# Patient Record
Sex: Female | Born: 1955 | Race: Black or African American | Hispanic: No | Marital: Married | Smoking: Never smoker
Health system: Southern US, Community
[De-identification: ages and names within clinical notes are randomized; demographics above are authoritative.]

## PROBLEM LIST (undated history)

## (undated) DIAGNOSIS — E739 Lactose intolerance, unspecified: Secondary | ICD-10-CM

## (undated) DIAGNOSIS — D649 Anemia, unspecified: Secondary | ICD-10-CM

## (undated) DIAGNOSIS — K219 Gastro-esophageal reflux disease without esophagitis: Secondary | ICD-10-CM

## (undated) DIAGNOSIS — M2550 Pain in unspecified joint: Secondary | ICD-10-CM

## (undated) DIAGNOSIS — IMO0001 Reserved for inherently not codable concepts without codable children: Secondary | ICD-10-CM

## (undated) DIAGNOSIS — M8580 Other specified disorders of bone density and structure, unspecified site: Secondary | ICD-10-CM

## (undated) DIAGNOSIS — Z8774 Personal history of (corrected) congenital malformations of heart and circulatory system: Secondary | ICD-10-CM

## (undated) DIAGNOSIS — N809 Endometriosis, unspecified: Secondary | ICD-10-CM

## (undated) DIAGNOSIS — I10 Essential (primary) hypertension: Secondary | ICD-10-CM

## (undated) DIAGNOSIS — M1711 Unilateral primary osteoarthritis, right knee: Secondary | ICD-10-CM

## (undated) DIAGNOSIS — K589 Irritable bowel syndrome without diarrhea: Secondary | ICD-10-CM

## (undated) DIAGNOSIS — M549 Dorsalgia, unspecified: Secondary | ICD-10-CM

## (undated) DIAGNOSIS — M7072 Other bursitis of hip, left hip: Secondary | ICD-10-CM

## (undated) DIAGNOSIS — M7071 Other bursitis of hip, right hip: Secondary | ICD-10-CM

## (undated) DIAGNOSIS — T7840XA Allergy, unspecified, initial encounter: Secondary | ICD-10-CM

## (undated) DIAGNOSIS — Z91018 Allergy to other foods: Secondary | ICD-10-CM

## (undated) DIAGNOSIS — K5900 Constipation, unspecified: Secondary | ICD-10-CM

## (undated) DIAGNOSIS — M25552 Pain in left hip: Secondary | ICD-10-CM

## (undated) DIAGNOSIS — L309 Dermatitis, unspecified: Secondary | ICD-10-CM

## (undated) DIAGNOSIS — B059 Measles without complication: Secondary | ICD-10-CM

## (undated) DIAGNOSIS — G459 Transient cerebral ischemic attack, unspecified: Secondary | ICD-10-CM

## (undated) DIAGNOSIS — R600 Localized edema: Secondary | ICD-10-CM

## (undated) DIAGNOSIS — H269 Unspecified cataract: Secondary | ICD-10-CM

## (undated) DIAGNOSIS — M25551 Pain in right hip: Secondary | ICD-10-CM

## (undated) DIAGNOSIS — Z8679 Personal history of other diseases of the circulatory system: Secondary | ICD-10-CM

## (undated) DIAGNOSIS — N390 Urinary tract infection, site not specified: Secondary | ICD-10-CM

## (undated) DIAGNOSIS — B019 Varicella without complication: Secondary | ICD-10-CM

## (undated) DIAGNOSIS — Z0000 Encounter for general adult medical examination without abnormal findings: Secondary | ICD-10-CM

## (undated) HISTORY — DX: Other bursitis of hip, left hip: M70.72

## (undated) HISTORY — PX: OTHER SURGICAL HISTORY: SHX169

## (undated) HISTORY — DX: Unspecified cataract: H26.9

## (undated) HISTORY — DX: Urinary tract infection, site not specified: N39.0

## (undated) HISTORY — DX: Personal history of (corrected) congenital malformations of heart and circulatory system: Z87.74

## (undated) HISTORY — DX: Measles without complication: B05.9

## (undated) HISTORY — DX: Transient cerebral ischemic attack, unspecified: G45.9

## (undated) HISTORY — DX: Pain in unspecified joint: M25.50

## (undated) HISTORY — DX: Pain in right hip: M25.551

## (undated) HISTORY — DX: Gastro-esophageal reflux disease without esophagitis: K21.9

## (undated) HISTORY — DX: Allergy to other foods: Z91.018

## (undated) HISTORY — DX: Irritable bowel syndrome, unspecified: K58.9

## (undated) HISTORY — DX: Unilateral primary osteoarthritis, right knee: M17.11

## (undated) HISTORY — PX: APPENDECTOMY: SHX54

## (undated) HISTORY — DX: Dorsalgia, unspecified: M54.9

## (undated) HISTORY — DX: Lactose intolerance, unspecified: E73.9

## (undated) HISTORY — DX: Constipation, unspecified: K59.00

## (undated) HISTORY — DX: Other bursitis of hip, right hip: M70.71

## (undated) HISTORY — DX: Anemia, unspecified: D64.9

## (undated) HISTORY — DX: Encounter for general adult medical examination without abnormal findings: Z00.00

## (undated) HISTORY — DX: Localized edema: R60.0

## (undated) HISTORY — DX: Pain in left hip: M25.552

## (undated) HISTORY — DX: Other specified disorders of bone density and structure, unspecified site: M85.80

## (undated) HISTORY — DX: Reserved for inherently not codable concepts without codable children: IMO0001

## (undated) HISTORY — DX: Essential (primary) hypertension: I10

## (undated) HISTORY — PX: RIGHT OOPHORECTOMY: SHX2359

## (undated) HISTORY — DX: Endometriosis, unspecified: N80.9

## (undated) HISTORY — DX: Dermatitis, unspecified: L30.9

## (undated) HISTORY — DX: Personal history of other diseases of the circulatory system: Z86.79

## (undated) HISTORY — PX: ABDOMINAL HYSTERECTOMY: SHX81

## (undated) HISTORY — DX: Allergy, unspecified, initial encounter: T78.40XA

## (undated) HISTORY — DX: Varicella without complication: B01.9

## (undated) HISTORY — PX: COLONOSCOPY: SHX174

---

## 2002-12-05 DIAGNOSIS — G459 Transient cerebral ischemic attack, unspecified: Secondary | ICD-10-CM

## 2002-12-05 HISTORY — DX: Transient cerebral ischemic attack, unspecified: G45.9

## 2004-12-05 HISTORY — PX: ASD REPAIR: SHX258

## 2007-06-15 ENCOUNTER — Encounter: Payer: Self-pay | Admitting: Family Medicine

## 2009-09-11 LAB — HM COLONOSCOPY

## 2013-09-06 ENCOUNTER — Telehealth: Payer: Self-pay | Admitting: Family Medicine

## 2013-09-06 ENCOUNTER — Telehealth: Payer: Self-pay | Admitting: Physician Assistant

## 2013-09-06 NOTE — Telephone Encounter (Signed)
Rec'd from Dr. Reatha Harps [facility] forward 19 pages to Dr. Daphine Deutscher

## 2013-09-06 NOTE — Telephone Encounter (Signed)
Received medical records from Dr. Dondra Prader

## 2013-09-09 ENCOUNTER — Telehealth: Payer: Self-pay | Admitting: Family Medicine

## 2013-09-09 NOTE — Telephone Encounter (Signed)
Received medical records from Dr. Ilsa Iha

## 2013-09-16 ENCOUNTER — Encounter: Payer: Self-pay | Admitting: Family Medicine

## 2013-09-16 ENCOUNTER — Ambulatory Visit (INDEPENDENT_AMBULATORY_CARE_PROVIDER_SITE_OTHER): Payer: PRIVATE HEALTH INSURANCE | Admitting: Family Medicine

## 2013-09-16 ENCOUNTER — Ambulatory Visit: Payer: PRIVATE HEALTH INSURANCE | Admitting: Physician Assistant

## 2013-09-16 VITALS — BP 106/72 | HR 71 | Temp 97.7°F | Ht 61.0 in | Wt 171.0 lb

## 2013-09-16 DIAGNOSIS — M25559 Pain in unspecified hip: Secondary | ICD-10-CM

## 2013-09-16 DIAGNOSIS — T7840XA Allergy, unspecified, initial encounter: Secondary | ICD-10-CM

## 2013-09-16 DIAGNOSIS — G459 Transient cerebral ischemic attack, unspecified: Secondary | ICD-10-CM

## 2013-09-16 DIAGNOSIS — K219 Gastro-esophageal reflux disease without esophagitis: Secondary | ICD-10-CM

## 2013-09-16 DIAGNOSIS — L309 Dermatitis, unspecified: Secondary | ICD-10-CM

## 2013-09-16 DIAGNOSIS — M25551 Pain in right hip: Secondary | ICD-10-CM

## 2013-09-16 DIAGNOSIS — Z8774 Personal history of (corrected) congenital malformations of heart and circulatory system: Secondary | ICD-10-CM

## 2013-09-16 DIAGNOSIS — E739 Lactose intolerance, unspecified: Secondary | ICD-10-CM | POA: Insufficient documentation

## 2013-09-16 DIAGNOSIS — D649 Anemia, unspecified: Secondary | ICD-10-CM

## 2013-09-16 DIAGNOSIS — I10 Essential (primary) hypertension: Secondary | ICD-10-CM

## 2013-09-16 DIAGNOSIS — Z23 Encounter for immunization: Secondary | ICD-10-CM

## 2013-09-16 DIAGNOSIS — L259 Unspecified contact dermatitis, unspecified cause: Secondary | ICD-10-CM

## 2013-09-16 DIAGNOSIS — N809 Endometriosis, unspecified: Secondary | ICD-10-CM

## 2013-09-16 DIAGNOSIS — IMO0001 Reserved for inherently not codable concepts without codable children: Secondary | ICD-10-CM

## 2013-09-16 DIAGNOSIS — N390 Urinary tract infection, site not specified: Secondary | ICD-10-CM | POA: Insufficient documentation

## 2013-09-16 DIAGNOSIS — Z8679 Personal history of other diseases of the circulatory system: Secondary | ICD-10-CM

## 2013-09-16 NOTE — Patient Instructions (Signed)
Dr Margart Sickles website for place to go get [employer] or [employer] an 81 mg Aspirin Consider MegaRed krill caps, daily   Preventive Care for Adults, Female A healthy lifestyle and preventive care can promote health and wellness. Preventive health guidelines for women include the following key practices.  A routine yearly physical is a good way to check with your caregiver about your health and preventive screening. It is a chance to share any concerns and updates on your health, and to receive a thorough exam.  Visit your dentist for a routine exam and preventive care every 6 months. Brush your teeth twice a day and floss once a day. Good oral hygiene prevents tooth decay and gum disease.  The frequency of eye exams is based on your age, health, family medical history, use of contact lenses, and other factors. Follow your caregiver's recommendations for frequency of eye exams.  Eat a healthy diet. Foods like vegetables, fruits, whole grains, low-fat dairy products, and lean protein foods contain the nutrients you need without too many calories. Decrease your intake of foods high in solid fats, added sugars, and salt. Eat the right amount of calories for you.Get information about a proper diet from your caregiver, if necessary.  Regular physical exercise is one of the most important things you can do for your health. Most adults should get at least 150 minutes of moderate-intensity exercise (any activity that increases your heart rate and causes you to sweat) each week. In addition, most adults need muscle-strengthening exercises on 2 or more days a week.  Maintain a healthy weight. The body mass index (BMI) is a screening tool to identify possible weight problems. It provides an estimate of body fat based on height and weight. Your caregiver can help determine your BMI, and can help you achieve or maintain a healthy weight.For adults 20 years and older:  A BMI below 18.5 is  considered underweight.  A BMI of 18.5 to 24.9 is normal.  A BMI of 25 to 29.9 is considered overweight.  A BMI of 30 and above is considered obese.  Maintain normal blood lipids and cholesterol levels by exercising and minimizing your intake of saturated fat. Eat a balanced diet with plenty of fruit and vegetables. Blood tests for lipids and cholesterol should begin at age 51 and be repeated every 5 years. If your lipid or cholesterol levels are high, you are over 50, or you are at high risk for heart disease, you may need your cholesterol levels checked more frequently.Ongoing high lipid and cholesterol levels should be treated with medicines if diet and exercise are not effective.  If you smoke, find out from your caregiver how to quit. If you do not use tobacco, do not start.  If you are pregnant, do not drink alcohol. If you are breastfeeding, be very cautious about drinking alcohol. If you are not pregnant and choose to drink alcohol, do not exceed 1 drink per day. One drink is considered to be 12 ounces (355 mL) of beer, 5 ounces (148 mL) of wine, or 1.5 ounces (44 mL) of liquor.  Avoid use of street drugs. Do not share needles with anyone. Ask for help if you need support or instructions about stopping the use of drugs.  High blood pressure causes heart disease and increases the risk of stroke. Your blood pressure should be checked at least every 1 to 2 years. Ongoing high blood pressure should be treated with medicines if weight loss and  exercise are not effective.  If you are 64 to 55 years old, ask your caregiver if you should take aspirin to prevent strokes.  Diabetes screening involves taking a blood sample to check your fasting blood sugar level. This should be done once every 3 years, after age 21, if you are within normal weight and without risk factors for diabetes. Testing should be considered at a younger age or be carried out more frequently if you are overweight and have at  least 1 risk factor for diabetes.  Breast cancer screening is essential preventive care for women. You should practice "breast self-awareness." This means understanding the normal appearance and feel of your breasts and may include breast self-examination. Any changes detected, no matter how small, should be reported to a caregiver. Women in their 67s and 30s should have a clinical breast exam (CBE) by a caregiver as part of a regular health exam every 1 to 3 years. After age 27, women should have a CBE every year. Starting at age 61, women should consider having a mammography (breast X-ray test) every year. Women who have a family history of breast cancer should talk to their caregiver about genetic screening. Women at a high risk of breast cancer should talk to their caregivers about having magnetic resonance imaging (MRI) and a mammography every year.  The Pap test is a screening test for cervical cancer. A Pap test can show cell changes on the cervix that might become cervical cancer if left untreated. A Pap test is a procedure in which cells are obtained and examined from the lower end of the uterus (cervix).  Women should have a Pap test starting at age 1.  Between ages 52 and 92, Pap tests should be repeated every 2 years.  Beginning at age 29, you should have a Pap test every 3 years as long as the past 3 Pap tests have been normal.  Some women have medical problems that increase the chance of getting cervical cancer. Talk to your caregiver about these problems. It is especially important to talk to your caregiver if a new problem develops soon after your last Pap test. In these cases, your caregiver may recommend more frequent screening and Pap tests.  The above recommendations are the same for women who have or have not gotten the vaccine for human papillomavirus (HPV).  If you had a hysterectomy for a problem that was not cancer or a condition that could lead to cancer, then you no longer  need Pap tests. Even if you no longer need a Pap test, a regular exam is a good idea to make sure no other problems are starting.  If you are between ages 43 and 94, and you have had normal Pap tests going back 10 years, you no longer need Pap tests. Even if you no longer need a Pap test, a regular exam is a good idea to make sure no other problems are starting.  If you have had past treatment for cervical cancer or a condition that could lead to cancer, you need Pap tests and screening for cancer for at least 20 years after your treatment.  If Pap tests have been discontinued, risk factors (such as a new sexual partner) need to be reassessed to determine if screening should be resumed.  The HPV test is an additional test that may be used for cervical cancer screening. The HPV test looks for the virus that can cause the cell changes on the cervix. The cells  collected during the Pap test can be tested for HPV. The HPV test could be used to screen women aged 36 years and older, and should be used in women of any age who have unclear Pap test results. After the age of 41, women should have HPV testing at the same frequency as a Pap test.  Colorectal cancer can be detected and often prevented. Most routine colorectal cancer screening begins at the age of 22 and continues through age 78. However, your caregiver may recommend screening at an earlier age if you have risk factors for colon cancer. On a yearly basis, your caregiver may provide home test kits to check for hidden blood in the stool. Use of a small camera at the end of a tube, to directly examine the colon (sigmoidoscopy or colonoscopy), can detect the earliest forms of colorectal cancer. Talk to your caregiver about this at age 49, when routine screening begins. Direct examination of the colon should be repeated every 5 to 10 years through age 2, unless early forms of pre-cancerous polyps or small growths are found.  Hepatitis C blood testing is  recommended for all people born from [birth year] through 1965 and any individual with known risks for hepatitis C.  Practice safe sex. Use condoms and avoid high-risk sexual practices to reduce the spread of sexually transmitted infections (STIs). STIs include gonorrhea, chlamydia, syphilis, trichomonas, herpes, HPV, and human immunodeficiency virus (HIV). Herpes, HIV, and HPV are viral illnesses that have no cure. They can result in disability, cancer, and death. Sexually active women aged 107 and younger should be checked for chlamydia. Older women with new or multiple partners should also be tested for chlamydia. Testing for other STIs is recommended if you are sexually active and at increased risk.  Osteoporosis is a disease in which the bones lose minerals and strength with aging. This can result in serious bone fractures. The risk of osteoporosis can be identified using a bone density scan. Women ages 75 and over and women at risk for fractures or osteoporosis should discuss screening with their caregivers. Ask your caregiver whether you should take a calcium supplement or vitamin D to reduce the rate of osteoporosis.  Menopause can be associated with physical symptoms and risks. Hormone replacement therapy is available to decrease symptoms and risks. You should talk to your caregiver about whether hormone replacement therapy is right for you.  Use sunscreen with sun protection factor (SPF) of 30 or more. Apply sunscreen liberally and repeatedly throughout the day. You should seek shade when your shadow is shorter than you. Protect yourself by wearing long sleeves, pants, a wide-brimmed hat, and sunglasses year round, whenever you are outdoors.  Once a month, do a whole body skin exam, using a mirror to look at the skin on your back. Notify your caregiver of new moles, moles that have irregular borders, moles that are larger than a pencil eraser, or moles that have changed in shape or color.  Stay current  with required immunizations.  Influenza. You need a dose every fall (or winter). The composition of the flu vaccine changes each year, so being vaccinated once is not enough.  Pneumococcal polysaccharide. You need 1 to 2 doses if you smoke cigarettes or if you have certain chronic medical conditions. You need 1 dose at age 65 (or older) if you have never been vaccinated.  Tetanus, diphtheria, pertussis (Tdap, Td). Get 1 dose of Tdap vaccine if you are younger than age 72, are over 39 and  have contact with an infant, are a Research scientist (physical sciences), are pregnant, or simply want to be protected from whooping cough. After that, you need a Td booster dose every 10 years. Consult your caregiver if you have not had at least 3 tetanus and diphtheria-containing shots sometime in your life or have a deep or dirty wound.  HPV. You need this vaccine if you are a woman age 97 or younger. The vaccine is given in 3 doses over 6 months.  Measles, mumps, rubella (MMR). You need at least 1 dose of MMR if you were born in 1957 or later. You may also need a second dose.  Meningococcal. If you are age 41 to 46 and a first-year college student living in a residence hall, or have one of several medical conditions, you need to get vaccinated against meningococcal disease. You may also need additional booster doses.  Zoster (shingles). If you are age 76 or older, you should get this vaccine.  Varicella (chickenpox). If you have never had chickenpox or you were vaccinated but received only 1 dose, talk to your caregiver to find out if you need this vaccine.  Hepatitis A. You need this vaccine if you have a specific risk factor for hepatitis A virus infection or you simply wish to be protected from this disease. The vaccine is usually given as 2 doses, 6 to 18 months apart.  Hepatitis B. You need this vaccine if you have a specific risk factor for hepatitis B virus infection or you simply wish to be protected from this disease.  The vaccine is given in 3 doses, usually over 6 months. Preventive Services / Frequency Ages 63 to 62  Blood pressure check.** / Every 1 to 2 years.  Lipid and cholesterol check.** / Every 5 years beginning at age 73.  Clinical breast exam.** / Every 3 years for women in their 14s and 30s.  Pap test.** / Every 2 years from ages 32 through 61. Every 3 years starting at age 80 through age 71 or 47 with a history of 3 consecutive normal Pap tests.  HPV screening.** / Every 3 years from ages 19 through ages 58 to 36 with a history of 3 consecutive normal Pap tests.  Hepatitis C blood test.** / For any individual with known risks for hepatitis C.  Skin self-exam. / Monthly.  Influenza immunization.** / Every year.  Pneumococcal polysaccharide immunization.** / 1 to 2 doses if you smoke cigarettes or if you have certain chronic medical conditions.  Tetanus, diphtheria, pertussis (Tdap, Td) immunization. / A one-time dose of Tdap vaccine. After that, you need a Td booster dose every 10 years.  HPV immunization. / 3 doses over 6 months, if you are 90 and younger.  Measles, mumps, rubella (MMR) immunization. / You need at least 1 dose of MMR if you were born in 1957 or later. You may also need a second dose.  Meningococcal immunization. / 1 dose if you are age 55 to 69 and a first-year college student living in a residence hall, or have one of several medical conditions, you need to get vaccinated against meningococcal disease. You may also need additional booster doses.  Varicella immunization.** / Consult your caregiver.  Hepatitis A immunization.** / Consult your caregiver. 2 doses, 6 to 18 months apart.  Hepatitis B immunization.** / Consult your caregiver. 3 doses usually over 6 months. Ages 59 to 99  Blood pressure check.** / Every 1 to 2 years.  Lipid and cholesterol check.** /  Every 5 years beginning at age 81.  Clinical breast exam.** / Every year after age  45.  Mammogram.** / Every year beginning at age 74 and continuing for as long as you are in good health. Consult with your caregiver.  Pap test.** / Every 3 years starting at age 62 through age 49 or 86 with a history of 3 consecutive normal Pap tests.  HPV screening.** / Every 3 years from ages 37 through ages 16 to 11 with a history of 3 consecutive normal Pap tests.  Fecal occult blood test (FOBT) of stool. / Every year beginning at age 44 and continuing until age 31. You may not need to do this test if you get a colonoscopy every 10 years.  Flexible sigmoidoscopy or colonoscopy.** / Every 5 years for a flexible sigmoidoscopy or every 10 years for a colonoscopy beginning at age 36 and continuing until age 37.  Hepatitis C blood test.** / For all people born from [birth year] through 1965 and any individual with known risks for hepatitis C.  Skin self-exam. / Monthly.  Influenza immunization.** / Every year.  Pneumococcal polysaccharide immunization.** / 1 to 2 doses if you smoke cigarettes or if you have certain chronic medical conditions.  Tetanus, diphtheria, pertussis (Tdap, Td) immunization.** / A one-time dose of Tdap vaccine. After that, you need a Td booster dose every 10 years.  Measles, mumps, rubella (MMR) immunization. / You need at least 1 dose of MMR if you were born in 1957 or later. You may also need a second dose.  Varicella immunization.** / Consult your caregiver.  Meningococcal immunization.** / Consult your caregiver.  Hepatitis A immunization.** / Consult your caregiver. 2 doses, 6 to 18 months apart.  Hepatitis B immunization.** / Consult your caregiver. 3 doses, usually over 6 months. Ages 64 and over  Blood pressure check.** / Every 1 to 2 years.  Lipid and cholesterol check.** / Every 5 years beginning at age 21.  Clinical breast exam.** / Every year after age 59.  Mammogram.** / Every year beginning at age 62 and continuing for as long as you are in good  health. Consult with your caregiver.  Pap test.** / Every 3 years starting at age 61 through age 40 or 25 with a 3 consecutive normal Pap tests. Testing can be stopped between 65 and 70 with 3 consecutive normal Pap tests and no abnormal Pap or HPV tests in the past 10 years.  HPV screening.** / Every 3 years from ages 32 through ages 39 or 51 with a history of 3 consecutive normal Pap tests. Testing can be stopped between 65 and 70 with 3 consecutive normal Pap tests and no abnormal Pap or HPV tests in the past 10 years.  Fecal occult blood test (FOBT) of stool. / Every year beginning at age 20 and continuing until age 2. You may not need to do this test if you get a colonoscopy every 10 years.  Flexible sigmoidoscopy or colonoscopy.** / Every 5 years for a flexible sigmoidoscopy or every 10 years for a colonoscopy beginning at age 14 and continuing until age 29.  Hepatitis C blood test.** / For all people born from [birth year] through 1963 and any individual with known risks for hepatitis C.  Osteoporosis screening.** / A one-time screening for women ages 20 and over and women at risk for fractures or osteoporosis.  Skin self-exam. / Monthly.  Influenza immunization.** / Every year.  Pneumococcal polysaccharide immunization.** / 1 dose at age 38 (  or older) if you have never been vaccinated.  Tetanus, diphtheria, pertussis (Tdap, Td) immunization. / A one-time dose of Tdap vaccine if you are over 65 and have contact with an infant, are a Research scientist (physical sciences), or simply want to be protected from whooping cough. After that, you need a Td booster dose every 10 years.  Varicella immunization.** / Consult your caregiver.  Meningococcal immunization.** / Consult your caregiver.  Hepatitis A immunization.** / Consult your caregiver. 2 doses, 6 to 18 months apart.  Hepatitis B immunization.** / Check with your caregiver. 3 doses, usually over 6 months. ** Family history and personal history of risk and  conditions may change your caregiver's recommendations. Document Released: 01/17/2002 Document Revised: 02/13/2012 Document Reviewed: 04/18/2011 [facility] Patient Information 2014 [place], [place].

## 2013-09-16 NOTE — Assessment & Plan Note (Addendum)
Well controlled on current meds, no changes 

## 2013-09-16 NOTE — Assessment & Plan Note (Signed)
Well controlled 

## 2013-09-18 ENCOUNTER — Encounter: Payer: Self-pay | Admitting: Family Medicine

## 2013-09-18 DIAGNOSIS — IMO0001 Reserved for inherently not codable concepts without codable children: Secondary | ICD-10-CM

## 2013-09-18 DIAGNOSIS — G459 Transient cerebral ischemic attack, unspecified: Secondary | ICD-10-CM | POA: Insufficient documentation

## 2013-09-18 DIAGNOSIS — L309 Dermatitis, unspecified: Secondary | ICD-10-CM

## 2013-09-18 DIAGNOSIS — M25551 Pain in right hip: Secondary | ICD-10-CM | POA: Insufficient documentation

## 2013-09-18 HISTORY — DX: Reserved for inherently not codable concepts without codable children: IMO0001

## 2013-09-18 HISTORY — DX: Dermatitis, unspecified: L30.9

## 2013-09-18 MED ORDER — ASPIRIN EC 81 MG PO TBEC: 81.0000 mg | DELAYED_RELEASE_TABLET | Freq: Every day | ORAL | Status: AC

## 2013-09-18 NOTE — Assessment & Plan Note (Signed)
Muscle cramps intermittently, encouraged increased hydration and magnesium prn.

## 2013-09-18 NOTE — Assessment & Plan Note (Deleted)
Did not require surgical repair

## 2013-09-18 NOTE — Assessment & Plan Note (Signed)
Encouraged ECASA daily

## 2013-09-18 NOTE — Progress Notes (Signed)
Patient ID: Jenna Johnson, female   DOB: [birth year], 57 y.o.   MRN: [id number] Jenna Johnson [id number] 11-22-1956 09/18/2013      Progress Note New Patient  Subjective  Chief Complaint  Chief Complaint  Patient presents with  . Establish Care    new patient  . Injections    flu and pneumonia    HPI  Patient is a 57 year old to establish care. No other history of eczema. Has chronic hip pain back in 2006 took some injections for arthritic and bursitis symptoms and they resolved but pain does recur. She has intermittent cramps in her calves and feet. No recent illness. No fevers, headache, chest pain, palpitations, shortness of breath, GI or GU complaints at this time. She does report having a brief TIA back in 2005 but no recurrence since then.   Past Medical History  Diagnosis Date  . Chicken pox as a child  . Measles as a child  . Allergy   . Anemia   . Hypertension   . UTI (urinary tract infection)   . GERD (gastroesophageal reflux disease)   . TIA (transient ischemic attack) 2004  . Lactose intolerance   . H/O atrial septal defect   . Endometriosis   . Allergic state   . Hip pain, bilateral 09/18/2013  . Myalgia and myositis 09/18/2013    Past Surgical History  Procedure Laterality Date  . Appendectomy  57 yrs old  . Tia  surgery 2005  . Hole in heart    . Asd repair  2006  . Abdominal hysterectomy  57 yrs old    still has one ovary  . Right oophorectomy      ruptured ovarian cyst at age 36    Family History  Problem Relation Age of Onset  . Multiple sclerosis Mother   . Other Mother     blue tumor, air embolism,  . Diabetes Father 83    type 2  . Other Father     fatty liver, lactose intolerant  . Cancer Paternal Grandmother 74    breast  . Diabetes Paternal Grandmother     History   Social History  . Marital Status: Married    Spouse Name: N/A    Number of Children: N/A  . Years of Education: N/A   Occupational History  . Not  on file.   Social History Main Topics  . Smoking status: Never Smoker   . Smokeless tobacco: Never Used  . Alcohol Use: Yes     Comment: occasionally  . Drug Use: No  . Sexual Activity: Yes    Partners: Male   Other Topics Concern  . Not on file   Social History Narrative  . No narrative on file    No current outpatient prescriptions on file prior to visit.   No current facility-administered medications on file prior to visit.    No Known Allergies  Review of Systems  Review of Systems  Constitutional: Negative for fever and malaise/fatigue.  HENT: Positive for congestion.   Eyes: Negative for discharge.  Respiratory: Negative for shortness of breath.   Cardiovascular: Negative for chest pain, palpitations and leg swelling.  Gastrointestinal: Negative for nausea, abdominal pain and diarrhea.  Genitourinary: Negative for dysuria.  Musculoskeletal: Positive for joint pain and myalgias. Negative for falls.  Skin: Negative for rash.  Neurological: Negative for loss of consciousness and headaches.  Endo/Heme/Allergies: Negative for polydipsia.  Psychiatric/Behavioral: Negative for depression and suicidal ideas. The patient is not  nervous/anxious and does not have insomnia.     Objective  BP 106/72  Pulse 71  Temp(Src) 97.7 F (36.5 C) (Oral)  Ht 5\' 1"  (1.549 m)  Wt 171 lb (77.565 kg)  BMI 32.33 kg/m2  SpO2 99%  Physical Exam  Physical Exam  Constitutional: She is oriented to person, place, and time and well-developed, well-nourished, and in no distress. No distress.  HENT:  Head: Normocephalic and atraumatic.  Eyes: Conjunctivae are normal.  Neck: Neck supple. No thyromegaly present.  Cardiovascular: Normal rate, regular rhythm and normal heart sounds.   No murmur heard. Pulmonary/Chest: Effort normal and breath sounds normal. She has no wheezes.  Abdominal: She exhibits no distension and no mass.  Musculoskeletal: She exhibits no edema.  Lymphadenopathy:     She has no cervical adenopathy.  Neurological: She is alert and oriented to person, place, and time.  Skin: Skin is warm and dry. No rash noted. She is not diaphoretic.  Psychiatric: Memory, affect and judgment normal.       Assessment & Plan  Hypertension Well controlled on current meds, no changes.  GERD (gastroesophageal reflux disease) Well controlled   Allergic state Using Cetirizine prn. No changes   Endometriosis Doing well.   Anemia Resolved s/p hysterectomy per patient. Will check cbc with next blood draw  Hip pain, bilateral R>L has been diagnosed with bursitis and arthritis in past. Encouraged activity as tolerated and Salon Pas prn  Myalgia and myositis Muscle cramps intermittently, encouraged increased hydration and magnesium prn.   Dermatitis Consider nummular eczema, try cortisone and report if no improvement.  TIA (transient ischemic attack) Encouraged [facility]ASA daily

## 2013-09-18 NOTE — Assessment & Plan Note (Signed)
R>L has been diagnosed with bursitis and arthritis in past. Encouraged activity as tolerated and Salon Pas prn

## 2013-09-18 NOTE — Assessment & Plan Note (Signed)
Using Cetirizine prn. No changes

## 2013-09-18 NOTE — Assessment & Plan Note (Signed)
Doing well 

## 2013-09-18 NOTE — Assessment & Plan Note (Signed)
Consider nummular eczema, try cortisone and report if no improvement.

## 2013-09-18 NOTE — Assessment & Plan Note (Signed)
Resolved s/p hysterectomy per patient. Will check cbc with next blood draw

## 2013-12-23 ENCOUNTER — Encounter: Payer: Self-pay | Admitting: Family Medicine

## 2013-12-26 ENCOUNTER — Telehealth: Payer: Self-pay

## 2013-12-26 DIAGNOSIS — Z0000 Encounter for general adult medical examination without abnormal findings: Secondary | ICD-10-CM

## 2013-12-26 DIAGNOSIS — I10 Essential (primary) hypertension: Secondary | ICD-10-CM

## 2013-12-26 LAB — HEPATIC FUNCTION PANEL
ALBUMIN: 4.3 g/dL (ref 3.5–5.2)
ALT: 19 U/L (ref 0–35)
AST: 20 U/L (ref 0–37)
Alkaline Phosphatase: 70 U/L (ref 39–117)
BILIRUBIN TOTAL: 0.4 mg/dL (ref 0.3–1.2)
Bilirubin, Direct: 0.1 mg/dL (ref 0.0–0.3)
Indirect Bilirubin: 0.3 mg/dL (ref 0.0–0.9)
Total Protein: 7.5 g/dL (ref 6.0–8.3)

## 2013-12-26 LAB — RENAL FUNCTION PANEL
Albumin: 4.3 g/dL (ref 3.5–5.2)
BUN: 18 mg/dL (ref 6–23)
CO2: 24 mEq/L (ref 19–32)
Calcium: 9.3 mg/dL (ref 8.4–10.5)
Chloride: 106 mEq/L (ref 96–112)
Creat: 0.88 mg/dL (ref 0.50–1.10)
GLUCOSE: 82 mg/dL (ref 70–99)
POTASSIUM: 4.6 meq/L (ref 3.5–5.3)
Phosphorus: 3.1 mg/dL (ref 2.3–4.6)
Sodium: 140 mEq/L (ref 135–145)

## 2013-12-26 LAB — LIPID PANEL
Cholesterol: 154 mg/dL (ref 0–200)
HDL: 60 mg/dL (ref >39)
LDL Cholesterol: 81 mg/dL (ref 0–99)
Total CHOL/HDL Ratio: 2.6 Ratio
Triglycerides: 63 mg/dL (ref <150)
VLDL: 13 mg/dL (ref 0–40)

## 2013-12-26 LAB — TSH: TSH: 0.945 u[IU]/mL (ref 0.350–4.500)

## 2013-12-26 NOTE — Telephone Encounter (Signed)
Lab order placed.

## 2013-12-27 LAB — CBC
HEMATOCRIT: 41.3 % (ref 36.0–46.0)
Hemoglobin: 13.5 g/dL (ref 12.0–15.0)
MCH: 27.1 pg (ref 26.0–34.0)
MCHC: 32.7 g/dL (ref 30.0–36.0)
MCV: 82.9 fL (ref 78.0–100.0)
Platelets: 179 10*3/uL (ref 150–400)
RBC: 4.98 MIL/uL (ref 3.87–5.11)
RDW: 15.1 % (ref 11.5–15.5)
WBC: 7.6 10*3/uL (ref 4.0–10.5)

## 2014-01-01 ENCOUNTER — Telehealth: Payer: Self-pay | Admitting: Family Medicine

## 2014-01-01 ENCOUNTER — Telehealth: Payer: Self-pay | Admitting: *Deleted

## 2014-01-01 NOTE — Telephone Encounter (Signed)
Pt left message requesting password for my chart login.  Attempted to generate activation code and received message that Ear[facility] Reapermychart is already activated. Left detailed message for pt to follow prompts on website to reset log-in information or call the # on her most recent AVS for support.

## 2014-01-01 NOTE — Telephone Encounter (Signed)
Patient could not find her lab results in her mychart.  Please call and advise

## 2014-01-01 NOTE — Telephone Encounter (Signed)
Results released to mychart. Notified pt that per lab note, Provider will discuss results at upcoming appt on 01/06/14. Pt voices understanding.

## 2014-01-06 ENCOUNTER — Other Ambulatory Visit: Payer: Self-pay | Admitting: Family Medicine

## 2014-01-06 ENCOUNTER — Ambulatory Visit (INDEPENDENT_AMBULATORY_CARE_PROVIDER_SITE_OTHER): Payer: PRIVATE HEALTH INSURANCE | Admitting: Family Medicine

## 2014-01-06 ENCOUNTER — Encounter: Payer: Self-pay | Admitting: Family Medicine

## 2014-01-06 VITALS — BP 118/82 | HR 67 | Temp 98.2°F | Ht 61.0 in | Wt 175.1 lb

## 2014-01-06 DIAGNOSIS — D649 Anemia, unspecified: Secondary | ICD-10-CM

## 2014-01-06 DIAGNOSIS — L259 Unspecified contact dermatitis, unspecified cause: Secondary | ICD-10-CM

## 2014-01-06 DIAGNOSIS — L309 Dermatitis, unspecified: Secondary | ICD-10-CM

## 2014-01-06 DIAGNOSIS — I10 Essential (primary) hypertension: Secondary | ICD-10-CM

## 2014-01-06 DIAGNOSIS — K219 Gastro-esophageal reflux disease without esophagitis: Secondary | ICD-10-CM

## 2014-01-06 DIAGNOSIS — Z780 Asymptomatic menopausal state: Secondary | ICD-10-CM

## 2014-01-06 DIAGNOSIS — Z0000 Encounter for general adult medical examination without abnormal findings: Secondary | ICD-10-CM

## 2014-01-06 DIAGNOSIS — N951 Menopausal and female climacteric states: Secondary | ICD-10-CM

## 2014-01-06 DIAGNOSIS — L989 Disorder of the skin and subcutaneous tissue, unspecified: Secondary | ICD-10-CM

## 2014-01-06 DIAGNOSIS — N6310 Unspecified lump in the right breast, unspecified quadrant: Secondary | ICD-10-CM

## 2014-01-06 NOTE — Progress Notes (Signed)
Pre visit review using our clinic review tool, if applicable. No additional management support is needed unless otherwise documented below in the visit note. 

## 2014-01-06 NOTE — Patient Instructions (Addendum)
Zostavax shot for shingles. Will insurance pay? Zinc 50 mg daily ([employer]uckyvitamins.com carries NOW) Biotin daily Fluids, rest, minimize carbs, protein at bed time, small ferquent meals  Alopecia Areata Alopecia areata is a self-destructing (autoimmune) disease that results in the loss of hair. In this condition your body's immune system attacks the hair follicle. The hair follicle is responsible for growing hair. Hair loss can occur on the scalp and other parts of the body. It usually starts as one or more small, round, smooth patches of hair loss. It occurs in males and females of all ages and races, but usually starts before age 56. The scalp is the most commonly affected area, but the beard or any hair-bearing site can be affected. This type of hair loss does not leave scars where the hair was lost.  Many people with alopecia areata only have a few patches of hair loss. In others, extensive patchy hair loss occurs. In a few people, all scalp hair is lost (alopecia totalis), or hair is lost from the entire scalp and body (alopecia universalis). No matter how widespread the hair loss, the hair follicles remain alive and are ready to resume normal hair production whenever they receive the correct signal. Hair re-growth may occur without treatment and can even restart after years of hair loss.  CAUSES  It is thought that something triggers the immune system to stop hair growth. It is not always known what the cause is. Some people have genetic markers that can increase the chance of developing alopecia areata. Alopecia areata often occurs in families whose members have had:  Asthma.  Hay fever.  Atopic eczema.  Some autoimmune diseases may also be a trigger, such as:  Thyroid disease.  Diabetes.  Rheumatoid arthritis.  Lupus erythematosus.  Vitiligo.  Pernicious anemia.  Addison's disease. OTHER SYMPTOMS In some people, the nail beds may develop rows of tiny dents (stippling) or the nail  beds can become distorted. Other than the hair and nail beds, no other body part is affected.  PROGNOSIS  Alopecia areata is not medically disabling. People with alopecia areata are usually in excellent health. Hair loss can be emotionally difficult. The National Alopecia Areata Foundation has resources available to help individuals and families with alopecia areata. Their goal is to help people with the condition live full, productive lives. There are many successful, well-adjusted, contented people living with Alopecia areata. Alopecia areata can be overcome with:  A positive self image.  Sound medical facts.  The support of others, such as:  Sometimes professional counseling is helpful to develop one's self-confidence and positive self-image. TREATMENT  There is no cure for alopecia areata. There are several available treatments. Treatments are most effective in milder cases. No treatment is effective for everyone. Choice of treatment depends mainly on a person's age and the extent of their hair loss. Alopecia areata occurs in two forms:   A mild patchy form where less than 50 percent of scalp hair is lost.  An extensive form where greater than 50 percent of scalp hair is lost. These two forms of alopecia areata behave quite differently, and the choice of treatment depends on which form is present. Current treatments do not turn alopecia areata off. They can stimulate the hair follicle to produce hair.  Some medications used to treat mild cases include:  Cortisone injections. The most common treatment is the injection of cortisone into the bare skin patches. The injections are usually given by a caregiver specializing in skin issues (  dermatologist). This caregiver will use a tiny needle to give multiple injections into the skin in and around the bare patches. The injections are repeated once a month. If new hair growth occurs, it is usually visible within 4 weeks. Treatment does not prevent  new patches of hair loss from developing. There are few side effects from local cortisone injections. Occasionally, temporary dents (depressions) in the skin result from the local injections, but these dents can fill in by themselves.  Topical minoxidil. Five percent topical minoxidil solution applied twice daily may grow hair in alopecia areata. Scalp, eyebrows, and beard hair may respond. If scalp hair re-grows completely, treatment can be stopped. Response may improve if topical cortisone cream is applied 30 minutes after the minoxidil. Topical minoxidil is safe, easy to use, and does not lower blood pressure in persons with normal blood pressure. Minoxidil can lead to unwanted facial hair growth in some people.  Anthralin cream or ointment. Another treatment is the application of anthralin cream or ointment. Anthralin is a tar-like substance that has been used widely for psoriasis. Anthralin is applied to the bare patches once daily. It is washed off after a short time, usually 30 to 60 minutes later. If new hair growth occurs, it is seen in 8 to 12 weeks. Anthralin can be irritating to the skin. It can cause temporary, brownish discoloration of the treated skin. By using short treatment times, skin irritation and skin staining are reduced without decreasing effectiveness. Care must be taken not to get anthralin in the eyes. Some of the medications used for more extensive cases where there is greater than 50% hair loss include:  Cortisone pills. Cortisone pills are sometimes given for extensive scalp hair loss. Cortisone taken internally is much stronger than local injections of cortisone into the skin. It is necessary to discuss possible side effects of cortisone pills with your caregiver. In general, however, cortisone pills are used in relatively few patients with alopecia areata due to health risks from prolonged use. Also, hair that has grown is likely to fall out when the cortisone pills are  stopped.  Topical minoxidil. See previous explanation under mild, patchy alopecia areata. However, minoxidil is not effective in total loss of scalp hair (alopecia totalis).  Topical immunotherapy. Another method of treating alopecia areata or alopecia totalis/universalis involves producing an allergic rash or allergic contact dermatitis. Chemicals such as diphencyprone (DPCP) or squaric acid dibutyl ester (SADBE) are applied to the scalp to produce an allergic rash which resembles poison oak or ivy. Approximately 40% of patients treated with topical immunotherapy will re-grow scalp hair after about 6 months of treatment. Those who do successfully re-grow scalp hair will need to continue treatment to maintain hair re-growth.  Wigs. For extensive hair loss, a wig can be an important option for some people. Proper attention will make a quality wig look completely natural. A wig will need to be cut, thinned, and styled. To keep a net base wig from falling off, special double-sided tape can be purchased in beauty supply outlets and fastened to the inside of the wig.  For those with completely bare heads, there are suction caps to which any wig can be attached. There are also entire suction cap wig units. FOR MORE INFORMATION National Alopecia Areata Foundation: [email or web address] Document Released: 06/25/2004 Document Revised: 02/13/2012 Document Reviewed: 01/27/2009 [facility] Patient Information 2014 [place], [place].

## 2014-01-06 NOTE — Progress Notes (Signed)
Patient ID: YARISBEL MIRANDA, female   DOB: 1956/08/29, 58 y.o.   MRN: [id number] NISA DECAIRE [id number] [birth year] 01/06/2014      Progress Note-Follow Up  Subjective  Chief Complaint  Chief Complaint  Patient presents with  . Annual Exam    physical    HPI  Patient is a 58 year old demented female who is in today for annual visit. She is generally doing well but does have some concerns. One is of persistent hot flashes. She has tried black cohosh and [place] rhubarb in the past and receives minimal benefit. Her other complaint is of some small black lesions on her right third finger in her face they've been present for several years and not changing but she is interested in having them looked at. No recent illness. Denies fevers or chills. Denies chest pain, palpitations shortness or breath GI or GU concerns. Is frustrated with weight gain and does try to exercise routinely. Has been avoiding dairy and notes her GI symptoms are somewhat improved  Past Medical History  Diagnosis Date  . Chicken pox as a child  . Measles as a child  . Allergy   . Anemia   . Hypertension   . UTI (urinary tract infection)   . GERD (gastroesophageal reflux disease)   . TIA (transient ischemic attack) 2004  . Lactose intolerance   . H/O atrial septal defect   . Endometriosis   . Allergic state   . Hip pain, bilateral 09/18/2013  . Myalgia and myositis 09/18/2013  . Dermatitis 09/18/2013  . TIA (transient ischemic attack) 09/18/2013    Past Surgical History  Procedure Laterality Date  . Appendectomy  58 yrs old  . Tia  surgery 2005  . Hole in heart    . Asd repair  2006  . Abdominal hysterectomy  58 yrs old    still has one ovary  . Right oophorectomy      ruptured ovarian cyst at age 58    Family History  Problem Relation Age of Onset  . Multiple sclerosis Mother   . Other Mother     blue tumor, air embolism,  . Diabetes Father 65    type 2  . Other Father     fatty  liver, lactose intolerant  . Cancer Paternal Grandmother 62    breast  . Diabetes Paternal Grandmother     History   Social History  . Marital Status: Married    Spouse Name: N/A    Number of Children: N/A  . Years of Education: N/A   Occupational History  . Not on file.   Social History Main Topics  . Smoking status: Never Smoker   . Smokeless tobacco: Never Used  . Alcohol Use: Yes     Comment: occasionally  . Drug Use: No  . Sexual Activity: Yes    Partners: Male   Other Topics Concern  . Not on file   Social History Narrative  . No narrative on file    Current Outpatient Prescriptions on File Prior to Visit  Medication Sig Dispense Refill  . aspirin EC 81 MG tablet Take 1 tablet (81 mg total) by mouth daily.      . cetirizine (ZYRTEC) 10 MG tablet Take 20 mg by mouth daily.      . Magnesium 250 MG TABS Take 2 tablets by mouth daily.       No current facility-administered medications on file prior to visit.    No Known Allergies  Review of Systems  Review of Systems  Constitutional: Negative for fever and malaise/fatigue.  HENT: Negative for congestion.   Eyes: Negative for discharge.  Respiratory: Negative for shortness of breath.   Cardiovascular: Negative for chest pain, palpitations and leg swelling.  Gastrointestinal: Negative for nausea, abdominal pain and diarrhea.  Genitourinary: Negative for dysuria.  Musculoskeletal: Negative for falls.  Skin: Negative for rash.  Neurological: Negative for loss of consciousness and headaches.  Endo/Heme/Allergies: Negative for polydipsia.  Psychiatric/Behavioral: Negative for depression and suicidal ideas. The patient is not nervous/anxious and does not have insomnia.     Objective  BP 118/82  Pulse 67  Temp(Src) 98.2 F (36.8 C) (Oral)  Ht 5\' 1"  (1.549 m)  Wt 175 lb 1.3 oz (79.416 kg)  BMI 33.10 kg/m2  SpO2 98%  Physical Exam  Physical Exam  Constitutional: She is oriented to person, place, and  time and well-developed, well-nourished, and in no distress. No distress.  HENT:  Head: Normocephalic and atraumatic.  Eyes: Conjunctivae are normal.  Neck: Neck supple. No thyromegaly present.  Cardiovascular: Normal rate, regular rhythm and normal heart sounds.   No murmur heard. Pulmonary/Chest: Effort normal and breath sounds normal. She has no wheezes.  Abdominal: She exhibits no distension and no mass.  Musculoskeletal: She exhibits no edema.  Lymphadenopathy:    She has no cervical adenopathy.  Neurological: She is alert and oriented to person, place, and time.  Skin: Skin is warm and dry. No rash noted. She is not diaphoretic.  Psychiatric: Memory, affect and judgment normal.    Lab Results  Component Value Date   TSH 0.945 12/26/2013   Lab Results  Component Value Date   WBC 7.6 12/26/2013   HGB 13.5 12/26/2013   HCT 41.3 12/26/2013   MCV 82.9 12/26/2013   PLT 179 12/26/2013   Lab Results  Component Value Date   CREATININE 0.88 12/26/2013   BUN 18 12/26/2013   NA 140 12/26/2013   K 4.6 12/26/2013   CL 106 12/26/2013   CO2 24 12/26/2013   Lab Results  Component Value Date   ALT 19 12/26/2013   AST 20 12/26/2013   ALKPHOS 70 12/26/2013   BILITOT 0.4 12/26/2013   Lab Results  Component Value Date   CHOL 154 12/26/2013   Lab Results  Component Value Date   HDL 60 12/26/2013   Lab Results  Component Value Date   LDLCALC 81 12/26/2013   Lab Results  Component Value Date   TRIG 63 12/26/2013   Lab Results  Component Value Date   CHOLHDL 2.6 12/26/2013     Assessment & Plan   Hypertension Well controlled, no changes  Anemia resolved  GERD (gastroesophageal reflux disease) Improved with dietary changes. Avoid dairy, continue probiotics  Dermatitis Small, dark lesions on hands and face has been referred to dermatology  Preventative health care Encouraged heart healthy diet, regular exercise, good sleep and avoid trans fats, mgm up to date. Colonoscopy utd.  Reviewed annual labs  Hot flash, menopausal Encouraged avoid carbs, get regular exercise and good sleep. Eat small, frequent meals with lean proteins. Avoid hormone mimicking chemicals in environment. [facility] and call for SNRI or hormones if no improvement, discussed risks and benefits

## 2014-01-07 ENCOUNTER — Encounter: Payer: Self-pay | Admitting: Family Medicine

## 2014-01-07 DIAGNOSIS — N951 Menopausal and female climacteric states: Secondary | ICD-10-CM | POA: Insufficient documentation

## 2014-01-07 DIAGNOSIS — Z0000 Encounter for general adult medical examination without abnormal findings: Secondary | ICD-10-CM | POA: Insufficient documentation

## 2014-01-07 DIAGNOSIS — D649 Anemia, unspecified: Secondary | ICD-10-CM

## 2014-01-07 DIAGNOSIS — K219 Gastro-esophageal reflux disease without esophagitis: Secondary | ICD-10-CM

## 2014-01-07 DIAGNOSIS — I10 Essential (primary) hypertension: Secondary | ICD-10-CM

## 2014-01-07 NOTE — Assessment & Plan Note (Signed)
Encouraged heart healthy diet, regular exercise, good sleep and avoid trans fats, mgm up to date. Colonoscopy utd. Reviewed annual labs

## 2014-01-07 NOTE — Assessment & Plan Note (Signed)
Encouraged heart healthy diet, regular exercise and avoid trans fats. Needs adequate sleep, reviewed annual labs, MGM utd. Proceed with pelvic at next annual exam

## 2014-01-07 NOTE — Assessment & Plan Note (Signed)
resolved 

## 2014-01-07 NOTE — Assessment & Plan Note (Signed)
Doing better with dietary changes such as minimizing dairy. Encouraged probiotics and avoid offending foods

## 2014-01-07 NOTE — Telephone Encounter (Signed)
Encounter opened in error

## 2014-01-07 NOTE — Assessment & Plan Note (Signed)
Encouraged avoid carbs, get regular exercise and good sleep. Eat small, frequent meals with lean proteins. Avoid hormone mimicking chemicals in environment. [facility] and call for SNRI or hormones if no improvement, discussed risks and benefits

## 2014-01-07 NOTE — Assessment & Plan Note (Signed)
Improved with dietary changes. Avoid dairy, continue probiotics

## 2014-01-07 NOTE — Assessment & Plan Note (Signed)
Encouraged good hydration, exercise, sleep, small and frequent meals with minimal carbs and lots of lean proteins. Avoid hormone mimicking chemicals in foods, products and environment. Consider Black Cohosh and report if interested in hormones or SNRI discussed risks and benefits

## 2014-01-07 NOTE — Assessment & Plan Note (Signed)
Well controlled no changes 

## 2014-01-07 NOTE — Assessment & Plan Note (Addendum)
Well controlled, no changes 

## 2014-01-07 NOTE — Assessment & Plan Note (Signed)
Small, dark lesions on hands and face has been referred to dermatology

## 2014-02-24 ENCOUNTER — Ambulatory Visit
Admission: RE | Admit: 2014-02-24 | Discharge: 2014-02-24 | Disposition: A | Discharge status: Home / Self Care | Payer: Self-pay | Source: Ambulatory Visit | Attending: Family Medicine | Admitting: Family Medicine

## 2014-02-24 ENCOUNTER — Other Ambulatory Visit: Payer: Self-pay | Admitting: Family Medicine

## 2014-02-24 ENCOUNTER — Ambulatory Visit
Admission: RE | Admit: 2014-02-24 | Discharge: 2014-02-24 | Disposition: A | Discharge status: Home / Self Care | Payer: PRIVATE HEALTH INSURANCE | Source: Ambulatory Visit | Attending: Family Medicine | Admitting: Family Medicine

## 2014-02-24 DIAGNOSIS — N6310 Unspecified lump in the right breast, unspecified quadrant: Secondary | ICD-10-CM

## 2014-02-24 DIAGNOSIS — Z780 Asymptomatic menopausal state: Secondary | ICD-10-CM

## 2014-03-06 ENCOUNTER — Other Ambulatory Visit: Payer: PRIVATE HEALTH INSURANCE

## 2014-03-20 ENCOUNTER — Telehealth: Payer: Self-pay

## 2014-03-20 DIAGNOSIS — M8580 Other specified disorders of bone density and structure, unspecified site: Secondary | ICD-10-CM

## 2014-03-20 NOTE — Telephone Encounter (Signed)
Per paperwork Jenna Johnson, CMA called and lmom to cb.  Per md: notify osteopenia, pt needs a vitamin d checked at her convenience, start citracal daily and increase exercise  I ordered vitamin d

## 2015-01-12 ENCOUNTER — Encounter: Payer: Self-pay | Admitting: Family Medicine

## 2015-01-12 ENCOUNTER — Ambulatory Visit (INDEPENDENT_AMBULATORY_CARE_PROVIDER_SITE_OTHER): Payer: PRIVATE HEALTH INSURANCE | Admitting: Family Medicine

## 2015-01-12 VITALS — BP 112/74 | HR 67 | Temp 97.6°F | Ht 61.0 in | Wt 172.0 lb

## 2015-01-12 DIAGNOSIS — D649 Anemia, unspecified: Secondary | ICD-10-CM

## 2015-01-12 DIAGNOSIS — M25551 Pain in right hip: Secondary | ICD-10-CM

## 2015-01-12 DIAGNOSIS — N390 Urinary tract infection, site not specified: Secondary | ICD-10-CM

## 2015-01-12 DIAGNOSIS — N951 Menopausal and female climacteric states: Secondary | ICD-10-CM

## 2015-01-12 DIAGNOSIS — Z0000 Encounter for general adult medical examination without abnormal findings: Secondary | ICD-10-CM

## 2015-01-12 DIAGNOSIS — M5442 Lumbago with sciatica, left side: Secondary | ICD-10-CM

## 2015-01-12 DIAGNOSIS — R350 Frequency of micturition: Secondary | ICD-10-CM

## 2015-01-12 DIAGNOSIS — K219 Gastro-esophageal reflux disease without esophagitis: Secondary | ICD-10-CM

## 2015-01-12 DIAGNOSIS — M25552 Pain in left hip: Secondary | ICD-10-CM

## 2015-01-12 DIAGNOSIS — M5441 Lumbago with sciatica, right side: Secondary | ICD-10-CM

## 2015-01-12 DIAGNOSIS — I10 Essential (primary) hypertension: Secondary | ICD-10-CM

## 2015-01-12 MED ORDER — RANITIDINE HCL 300 MG PO TABS: 300.0000 mg | ORAL_TABLET | Freq: Every evening | ORAL | Status: DC | PRN

## 2015-01-12 NOTE — Progress Notes (Signed)
Jenna Johnson  [id number] Oct 20, 1956 01/12/2015      Progress Note-Follow Up  Subjective  Chief Complaint  Chief Complaint  Patient presents with  . Annual Exam    HPI  Patient is a 59 y.o. female in today for routine medical care. Patient is in today for annual exam and having numerous concerns. She has had trouble with her reflux. Initially she reports his symptoms are infrequent but then she acknowledges she is taking Tums or Pepto-Bismol at least 3 times weekly. She gets partial relief but symptoms return quickly. She is also noting increased low back pain, bilateral hip pain. Is been worsening over the last couple of weeks and is significantly uncomfortable when she tries to walk. No new incontinence. Is noting some urinary urgency and frequency but no hematuria or dysuria. No recent hospitalization. Denies CP/palp/SOB/HA/congestion/fevers/GI or GU c/o. Taking meds as prescribed  Past Medical History  Diagnosis Date  . Chicken pox as a child  . Measles as a child  . Allergy   . Anemia   . Hypertension   . UTI (urinary tract infection)   . GERD (gastroesophageal reflux disease)   . TIA (transient ischemic attack) 2004  . Lactose intolerance   . H/O atrial septal defect   . Endometriosis   . Allergic state   . Hip pain, bilateral 09/18/2013  . Myalgia and myositis 09/18/2013  . Dermatitis 09/18/2013  . TIA (transient ischemic attack) 09/18/2013  . Preventative health care 01/07/2014    Past Surgical History  Procedure Laterality Date  . Appendectomy  59 yrs old  . Tia  surgery 2005  . Hole in heart    . Asd repair  2006  . Abdominal hysterectomy  59 yrs old    still has one ovary  . Right oophorectomy      ruptured ovarian cyst at age 22    Family History  Problem Relation Age of Onset  . Multiple sclerosis Mother   . Other Mother     blue tumor, air embolism,  . Diabetes Father 10    type 2  . Other Father     fatty liver, lactose intolerant  .  Cancer Paternal Grandmother 72    breast  . Diabetes Paternal Grandmother   . Other Sister     s/p hysterectomy  . Other Sister     s/p hysterectomy    History   Social History  . Marital Status: Married    Spouse Name: N/A    Number of Children: N/A  . Years of Education: N/A   Occupational History  . Not on file.   Social History Main Topics  . Smoking status: Never Smoker   . Smokeless tobacco: Never Used  . Alcohol Use: Yes     Comment: occasionally  . Drug Use: No  . Sexual Activity:    Partners: Male   Other Topics Concern  . Not on file   Social History Narrative    Current Outpatient Prescriptions on File Prior to Visit  Medication Sig Dispense Refill  . aspirin EC 81 MG tablet Take 1 tablet (81 mg total) by mouth daily.    Marland Kitchen guaifenesin (HUMIBID E) 400 MG TABS tablet Take 400 mg by mouth every 4 (four) hours.    Marland Kitchen loratadine (CLARITIN) 10 MG tablet Take 10 mg by mouth daily.    . Magnesium 250 MG TABS Take 2 tablets by mouth daily.    Marland Kitchen OVER THE COUNTER MEDICATION [place]  Rhubarb- 1 capsule daily    . cetirizine (ZYRTEC) 10 MG tablet Take 20 mg by mouth daily.     No current facility-administered medications on file prior to visit.    No Known Allergies  Review of Systems  Review of Systems  Constitutional: Negative for fever, chills and malaise/fatigue.  HENT: Negative for congestion, hearing loss and nosebleeds.   Eyes: Negative for discharge.  Respiratory: Negative for cough, sputum production, shortness of breath and wheezing.   Cardiovascular: Negative for chest pain, palpitations and leg swelling.  Gastrointestinal: Positive for heartburn and nausea. Negative for vomiting, abdominal pain, diarrhea, constipation and blood in stool.  Genitourinary: Positive for urgency and frequency. Negative for dysuria and hematuria.  Musculoskeletal: Positive for back pain and joint pain. Negative for myalgias and falls.  Skin: Negative for rash.    Neurological: Negative for dizziness, tremors, sensory change, focal weakness, loss of consciousness, weakness and headaches.  Endo/Heme/Allergies: Negative for polydipsia. Does not bruise/bleed easily.  Psychiatric/Behavioral: Negative for depression and suicidal ideas. The patient has insomnia. The patient is not nervous/anxious.     Objective  BP 112/74 mmHg  Pulse 67  Temp(Src) 97.6 F (36.4 C) (Oral)  Ht 5\' 1"  (1.549 m)  Wt 172 lb (78.019 kg)  BMI 32.52 kg/m2  SpO2 96%  Physical Exam  Physical Exam  Constitutional: She is oriented to person, place, and time and well-developed, well-nourished, and in no distress. No distress.  HENT:  Head: Normocephalic and atraumatic.  Right Ear: External ear normal.  Left Ear: External ear normal.  Nose: Nose normal.  Mouth/Throat: Oropharynx is clear and moist. No oropharyngeal exudate.  Eyes: Conjunctivae are normal. Pupils are equal, round, and reactive to light. Right eye exhibits no discharge. Left eye exhibits no discharge. No scleral icterus.  Neck: Normal range of motion. Neck supple. No thyromegaly present.  Cardiovascular: Normal rate, regular rhythm, normal heart sounds and intact distal pulses.   No murmur heard. Pulmonary/Chest: Effort normal and breath sounds normal. No respiratory distress. She has no wheezes. She has no rales.  Abdominal: Soft. Bowel sounds are normal. She exhibits no distension and no mass. There is no tenderness.  Musculoskeletal: Normal range of motion. She exhibits no edema or tenderness.  Lymphadenopathy:    She has no cervical adenopathy.  Neurological: She is alert and oriented to person, place, and time. She has normal reflexes. No cranial nerve deficit. Coordination normal.  Skin: Skin is warm and dry. No rash noted. She is not diaphoretic.  Psychiatric: Mood, memory and affect normal.    Lab Results  Component Value Date   TSH 0.945 12/26/2013   Lab Results  Component Value Date   WBC  7.6 12/26/2013   HGB 13.5 12/26/2013   HCT 41.3 12/26/2013   MCV 82.9 12/26/2013   PLT 179 12/26/2013   Lab Results  Component Value Date   CREATININE 0.88 12/26/2013   BUN 18 12/26/2013   NA 140 12/26/2013   K 4.6 12/26/2013   CL 106 12/26/2013   CO2 24 12/26/2013   Lab Results  Component Value Date   ALT 19 12/26/2013   AST 20 12/26/2013   ALKPHOS 70 12/26/2013   BILITOT 0.4 12/26/2013   Lab Results  Component Value Date   CHOL 154 12/26/2013   Lab Results  Component Value Date   HDL 60 12/26/2013   Lab Results  Component Value Date   LDLCALC 81 12/26/2013   Lab Results  Component Value Date  TRIG 63 12/26/2013   Lab Results  Component Value Date   CHOLHDL 2.6 12/26/2013     Assessment & Plan  GERD (gastroesophageal reflux disease) Avoid offending foods, start probiotics. Do not eat large meals in late evening and consider raising head of bed. Worsening symptoms recently using more Tums, checked H pylori at visit negative. If symptoms continue to escalate will need to return to gastroenterology for consideration. Use H2 blocer and PPI daily and Tums prn.    Hypertension Well controlled, no changes to meds. Encouraged heart healthy diet such as the DASH diet and exercise as tolerated.    Hip pain, bilateral And low back pain, xray unremarkable but due to persistent symptoms will refer to sports medicine for further consideration, stay as active as possible. Encouraged moist heat and gentle stretching as tolerated. May try NSAIDs and prescription meds as directed and report if symptoms worsen or seek immediate care, salon pas prn   Anemia resolved   Preventative health care Patient encouraged to maintain heart healthy diet, regular exercise, adequate sleep. Consider daily probiotics. Take medications as prescribed. Labs ordered and reviewed   UTI (urinary tract infection) With some urinary frequency and urgency culture has insignificant growth,  encouraged increased hydration, cranberry and probiotics   Hot flash, menopausal Using black cohosh, encouraged small frequent meals with lean proteins, minimize carbs, alcohol and caffeine. Increase exercise, get adequate sleep

## 2015-01-12 NOTE — Progress Notes (Signed)
Pre visit review using our clinic review tool, if applicable. No additional management support is needed unless otherwise documented below in the visit note. 

## 2015-01-12 NOTE — Patient Instructions (Addendum)
  [facility] [place] [facility]   Fiber and fluids, increase 64 oz of non caffeine, extra fluids for extra caffeine Probiotics, ginger for nausea and heartburn  Salon pas patches and moist heat   Raynaud's Syndrome Raynaud's Syndrome is a disorder of the blood vessels in your hands and feet. It occurs when small arteries of the arms/hands or legs/feet become sensitive to cold or emotional upset. This causes the arteries to constrict, or narrow, and reduces blood flow to the area. The color in the fingers or toes changes from white to bluish to red and this is not usually painful. There may be numbness and tingling. Sores on the skin (ulcers) can form. Symptoms are usually relieved by warming. HOME CARE INSTRUCTIONS   Avoid exposure to cold. Keep your whole body warm and dry. Dress in layers. Wear mittens or gloves when handling ice or frozen food and when outdoors. Use holders for glasses or cans containing cold drinks. If possible, stay indoors during cold weather.  Limit your use of caffeine. Switch to decaffeinated coffee, tea, and soda pop. Avoid chocolate.  Avoid smoking or being around cigarette smoke. Smoke will make symptoms worse.  Wear loose fitting socks and comfortable, roomy shoes.  Avoid vibrating tools and machinery.  If possible, avoid stressful and emotional situations. Exercise, meditation and yoga may help you cope with stress. Biofeedback may be useful.  Ask your caregiver about medicine (calcium channel blockers) that may control Raynaud's phenomena. SEEK MEDICAL CARE IF:   Your discomfort becomes worse, despite conservative treatment.  You develop sores on your fingers and toes that do not heal. Document Released: 11/18/2000 Document Revised: 02/13/2012 Document Reviewed: 11/25/2008 [facility] Patient Information 2015 [employer]. This information is not intended to replace advice given to you by your health  care provider. Make sure you discuss any questions you have with your health care provider.

## 2015-01-12 NOTE — Assessment & Plan Note (Addendum)
Avoid offending foods, start probiotics. Do not eat large meals in late evening and consider raising head of bed. Worsening symptoms recently using more Tums, checked H pylori at visit negative. If symptoms continue to escalate will need to return to gastroenterology for consideration. Use H2 blocer and PPI daily and Tums prn.

## 2015-01-13 LAB — COMPREHENSIVE METABOLIC PANEL
ALK PHOS: 69 U/L (ref 39–117)
ALT: 19 U/L (ref 0–35)
AST: 20 U/L (ref 0–37)
Albumin: 4.5 g/dL (ref 3.5–5.2)
BUN: 14 mg/dL (ref 6–23)
CO2: 24 mEq/L (ref 19–32)
CREATININE: 0.85 mg/dL (ref 0.40–1.20)
Calcium: 10 mg/dL (ref 8.4–10.5)
Chloride: 108 mEq/L (ref 96–112)
GFR: 88.74 mL/min (ref >60.00)
Glucose, Bld: 79 mg/dL (ref 70–99)
POTASSIUM: 4.3 meq/L (ref 3.5–5.1)
Sodium: 143 mEq/L (ref 135–145)
Total Bilirubin: 0.4 mg/dL (ref 0.2–1.2)
Total Protein: 8 g/dL (ref 6.0–8.3)

## 2015-01-13 LAB — URINALYSIS, ROUTINE W REFLEX MICROSCOPIC
BILIRUBIN URINE: NEGATIVE
KETONES UR: NEGATIVE
LEUKOCYTES UA: NEGATIVE
Nitrite: NEGATIVE
Specific Gravity, Urine: 1.02 (ref 1.000–1.030)
Total Protein, Urine: NEGATIVE
UROBILINOGEN UA: 0.2 (ref 0.0–1.0)
Urine Glucose: NEGATIVE
WBC, UA: NONE SEEN (ref 0–?)
pH: 5.5 (ref 5.0–8.0)

## 2015-01-13 LAB — CBC
HCT: 41 % (ref 36.0–46.0)
HEMOGLOBIN: 13.6 g/dL (ref 12.0–15.0)
MCHC: 33 g/dL (ref 30.0–36.0)
MCV: 83.5 fl (ref 78.0–100.0)
Platelets: 174 10*3/uL (ref 150.0–400.0)
RBC: 4.92 Mil/uL (ref 3.87–5.11)
RDW: 14.5 % (ref 11.5–15.5)
WBC: 9.5 10*3/uL (ref 4.0–10.5)

## 2015-01-13 LAB — LIPID PANEL
CHOLESTEROL: 154 mg/dL (ref 0–200)
HDL: 63.9 mg/dL (ref >39.00)
LDL Cholesterol: 80 mg/dL (ref 0–99)
NonHDL: 90.1
Total CHOL/HDL Ratio: 2
Triglycerides: 50 mg/dL (ref 0.0–149.0)
VLDL: 10 mg/dL (ref 0.0–40.0)

## 2015-01-13 LAB — URINE CULTURE: Colony Count: 8000

## 2015-01-13 LAB — H. PYLORI ANTIBODY, IGG: H Pylori IgG: NEGATIVE

## 2015-01-13 LAB — TSH: TSH: 1.17 u[IU]/mL (ref 0.35–4.50)

## 2015-01-14 ENCOUNTER — Ambulatory Visit (HOSPITAL_BASED_OUTPATIENT_CLINIC_OR_DEPARTMENT_OTHER)
Admission: RE | Admit: 2015-01-14 | Discharge: 2015-01-14 | Disposition: A | Discharge status: Home / Self Care | Payer: PRIVATE HEALTH INSURANCE | Source: Ambulatory Visit | Attending: Family Medicine | Admitting: Family Medicine

## 2015-01-14 DIAGNOSIS — M5441 Lumbago with sciatica, right side: Secondary | ICD-10-CM

## 2015-01-14 DIAGNOSIS — M79604 Pain in right leg: Secondary | ICD-10-CM | POA: Insufficient documentation

## 2015-01-14 DIAGNOSIS — D649 Anemia, unspecified: Secondary | ICD-10-CM

## 2015-01-14 DIAGNOSIS — Z0000 Encounter for general adult medical examination without abnormal findings: Secondary | ICD-10-CM

## 2015-01-14 DIAGNOSIS — M79605 Pain in left leg: Secondary | ICD-10-CM | POA: Insufficient documentation

## 2015-01-14 DIAGNOSIS — R350 Frequency of micturition: Secondary | ICD-10-CM

## 2015-01-14 DIAGNOSIS — M5442 Lumbago with sciatica, left side: Secondary | ICD-10-CM

## 2015-01-14 DIAGNOSIS — M545 Low back pain: Secondary | ICD-10-CM | POA: Diagnosis present

## 2015-01-14 DIAGNOSIS — K219 Gastro-esophageal reflux disease without esophagitis: Secondary | ICD-10-CM

## 2015-01-15 ENCOUNTER — Telehealth: Payer: Self-pay | Admitting: Family Medicine

## 2015-01-15 DIAGNOSIS — R319 Hematuria, unspecified: Principal | ICD-10-CM

## 2015-01-15 DIAGNOSIS — N390 Urinary tract infection, site not specified: Secondary | ICD-10-CM

## 2015-01-15 NOTE — Telephone Encounter (Signed)
UA ordered for lab appt. On 02/26/15.

## 2015-01-19 NOTE — Assessment & Plan Note (Signed)
Patient encouraged to maintain heart healthy diet, regular exercise, adequate sleep. Consider daily probiotics. Take medications as prescribed. Labs ordered and reviewed 

## 2015-01-19 NOTE — Assessment & Plan Note (Signed)
Well controlled, no changes to meds. Encouraged heart healthy diet such as the DASH diet and exercise as tolerated.  °

## 2015-01-19 NOTE — Assessment & Plan Note (Signed)
And low back pain, xray unremarkable but due to persistent symptoms will refer to sports medicine for further consideration, stay as active as possible. Encouraged moist heat and gentle stretching as tolerated. May try NSAIDs and prescription meds as directed and report if symptoms worsen or seek immediate care, salon pas prn

## 2015-01-19 NOTE — Assessment & Plan Note (Signed)
Using black cohosh, encouraged small frequent meals with lean proteins, minimize carbs, alcohol and caffeine. Increase exercise, get adequate sleep

## 2015-01-19 NOTE — Assessment & Plan Note (Signed)
resolved 

## 2015-01-19 NOTE — Assessment & Plan Note (Signed)
With some urinary frequency and urgency culture has insignificant growth, encouraged increased hydration, cranberry and probiotics

## 2015-01-20 ENCOUNTER — Other Ambulatory Visit (INDEPENDENT_AMBULATORY_CARE_PROVIDER_SITE_OTHER): Payer: PRIVATE HEALTH INSURANCE

## 2015-01-20 ENCOUNTER — Ambulatory Visit (INDEPENDENT_AMBULATORY_CARE_PROVIDER_SITE_OTHER): Payer: PRIVATE HEALTH INSURANCE | Admitting: Family Medicine

## 2015-01-20 ENCOUNTER — Encounter: Payer: Self-pay | Admitting: Family Medicine

## 2015-01-20 VITALS — BP 126/84 | HR 71 | Ht 61.0 in | Wt 172.0 lb

## 2015-01-20 DIAGNOSIS — M79604 Pain in right leg: Secondary | ICD-10-CM

## 2015-01-20 DIAGNOSIS — M7061 Trochanteric bursitis, right hip: Secondary | ICD-10-CM

## 2015-01-20 DIAGNOSIS — M2141 Flat foot [pes planus] (acquired), right foot: Secondary | ICD-10-CM | POA: Insufficient documentation

## 2015-01-20 DIAGNOSIS — M2142 Flat foot [pes planus] (acquired), left foot: Secondary | ICD-10-CM

## 2015-01-20 DIAGNOSIS — M7062 Trochanteric bursitis, left hip: Secondary | ICD-10-CM

## 2015-01-20 NOTE — Progress Notes (Signed)
Tawana ScaleZach Smith D.O. [facility] [address] [place], [place] [address] Phone: [phone] Subjective:    I'm seeing this patient by the request  of:  Danise EdgeBLYTH, STACEY, MD   CC: low back pain.    BJY:NWGNFAOZHYHPI:Subjective [name] is a 59 y.o. female coming in with complaint of mid as well as low back pain. Patient has had x-rays before. 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 Patient states actually is having pain that seems to be more on the lateral aspect of the hips and radiating down her legs. Stands to be more on the lateral aspect of the legs as well. Stopping her knee. Patient states that after about 1 mile of walking starts having discomfort. Patient states after sitting a long amount of time can be very painful as well. Patient states that the pain is not a 10 because it is also waking her up when she rolls onto that side.      Past medical history, social, surgical and family history all reviewed in electronic medical record.   Review of Systems: No headache, visual changes, nausea, vomiting, diarrhea, constipation, dizziness, abdominal pain, skin rash, fevers, chills, night sweats, weight loss, swollen lymph nodes, body aches, joint swelling, muscle aches, chest pain, shortness of breath, mood changes.   Objective Blood pressure 126/84, pulse 71, height 5\' 1"  (1.549 m), weight 172 lb (78.019 kg), SpO2 98 %.  General: No apparent distress alert and oriented x3 mood and affect normal, dressed appropriately.  HEENT: Pupils equal, extraocular movements intact  Respiratory: Patient's speak in full sentences and does not appear short of breath  Cardiovascular: No lower extremity edema, non tender, no erythema  Skin: Warm dry intact with no signs of infection or rash on extremities or on axial skeleton.  Abdomen: Soft nontender  Neuro: Cranial nerves II through XII are  intact, neurovascularly intact in all extremities with 2+ DTRs and 2+ pulses.  Lymph: No lymphadenopathy of posterior or anterior cervical chain or axillae bilaterally.  Gait normal with good balance and coordination.  MSK:  Non tender with full range of motion and good stability and symmetric strength and tone of shoulders, elbows, wrist, , knee and ankles bilaterally.   Foot exam shows overpronation the hindfoot with pes planus bilaterally. QMV:HQIONGEXBHip:bilateral ROM IR: 25 Deg, ER: 45 Deg, Flexion: 120 Deg, Extension: 100 Deg, Abduction: 45 Deg, Adduction: 45 Deg Strength IR: 5/5, ER: 5/5, Flexion: 5/5, Extension: 5/5, Abduction: 3+/5, Adduction: 5/5 Pelvic alignment unremarkable to inspection and palpation. Standing hip rotation and gait without trendelenburg sign / unsteadiness. Greater trochanter severe tenderness to palpationbilaterally. No tenderness over piriformis  Positive Faber No SI joint tenderness and normal minimal SI movement.  MSK US performed of: Right This study was ordered, performed, and interpreted by Terrilee FilesZach Smith D.O.  Hip: Trochanteric bursa with significant hypoechoic changes and swelling Acetabular labrum visualized and without tears, displacement, or effusion in joint. Femoral neck appears unremarkable without increased power doppler signal along Cortex.  IMPRESSION:  Greater trochanter bursitis bilaterally   Procedure: Real-time Ultrasound Guided Injection of right greater trochanteric bursitis secondary to patient's body habitus Device: GE Logiq E  Ultrasound guided injection is preferred based studies that show increased duration, increased effect, greater accuracy, decreased procedural pain, increased response rate, and decreased cost with ultrasound guided versus blind injection.  Verbal informed consent obtained.  Time-out conducted.  Noted no  overlying erythema, induration, or other signs of local infection.  Skin prepped in a sterile fashion.  Local  anesthesia: Topical Ethyl chloride.  With sterile technique and under real time ultrasound guidance:  Greater trochanteric area was visualized and patient's bursa was noted. A 22-gauge 3 inch needle was inserted and 4 cc of 0.5% Marcaine and 1 cc of Kenalog 40 mg/dL was injected. Pictures taken Completed without difficulty  Pain immediately resolved suggesting accurate placement of the medication.  Advised to call if fevers/chills, erythema, induration, drainage, or persistent bleeding.  Images permanently stored and available for review in the ultrasound unit.  Impression: Technically successful ultrasound guided injection.    Procedure: Real-time Ultrasound Guided Injection of left  greater trochanteric bursitis secondary to patient's body habitus Device: GE Logiq E  Ultrasound guided injection is preferred based studies that show increased duration, increased effect, greater accuracy, decreased procedural pain, increased response rate, and decreased cost with ultrasound guided versus blind injection.  Verbal informed consent obtained.  Time-out conducted.  Noted no overlying erythema, induration, or other signs of local infection.  Skin prepped in a sterile fashion.  Local anesthesia: Topical Ethyl chloride.  With sterile technique and under real time ultrasound guidance:  Greater trochanteric area was visualized and patient's bursa was noted. A 22-gauge 3 inch needle was inserted and 4 cc of 0.5% Marcaine and 1 cc of Kenalog 40 mg/dL was injected. Pictures taken Completed without difficulty  Pain immediately resolved suggesting accurate placement of the medication.  Advised to call if fevers/chills, erythema, induration, drainage, or persistent bleeding.  Images permanently stored and available for review in the ultrasound unit.  Impression: Technically successful ultrasound guided injection.   Impression and Recommendations:     This case required medical decision making of moderate  complexity.

## 2015-01-20 NOTE — Progress Notes (Signed)
Pre visit review using our clinic review tool, if applicable. No additional management support is needed unless otherwise documented below in the visit note. 

## 2015-01-20 NOTE — Patient Instructions (Addendum)
Good to see you.   Ice 20 minutes 2 times daily. Usually after activity and before bed. Exercises 3 times a week.  Exercises on wall.  Heel and butt touching.  Raise leg 6 inches and hold 2 seconds.  Down slow for count of 4 seconds.  1 set of 30 reps daily on both sides.  Pennsaid twice daily as needed Consider turmeric 500mg  twice daily.  Water aerobics and cycling with low resistance are the best two types of exercise for arthritis. We will get orthotics in 2 weeks See me again in 4 weeks.

## 2015-01-20 NOTE — Assessment & Plan Note (Signed)
Patient was given injections today. I do think that this will be very beneficial. Patient walked L being nearly pain-free. Patient did learn home exercises from athletic trainer today. We discussed icing regimen, we discussed also patient possibly getting orthotics with patient having severe pes planus bilaterally with overpronation the hindfoot this can increase the pressure on the lateral aspect of the hip. Patient and will come back and see me again in 3-4 weeks for further evaluation and treatment.

## 2015-02-02 ENCOUNTER — Encounter: Payer: Self-pay | Admitting: Family Medicine

## 2015-02-02 ENCOUNTER — Ambulatory Visit (INDEPENDENT_AMBULATORY_CARE_PROVIDER_SITE_OTHER): Payer: PRIVATE HEALTH INSURANCE | Admitting: Family Medicine

## 2015-02-02 DIAGNOSIS — M2141 Flat foot [pes planus] (acquired), right foot: Secondary | ICD-10-CM

## 2015-02-02 DIAGNOSIS — M2142 Flat foot [pes planus] (acquired), left foot: Secondary | ICD-10-CM

## 2015-02-02 NOTE — Progress Notes (Signed)
Pre visit review using our clinic review tool, if applicable. No additional management support is needed unless otherwise documented below in the visit note. 

## 2015-02-02 NOTE — Progress Notes (Signed)
Procedure Note   Patient was fitted for a : standard, cushioned, semi-rigid orthotic. The orthotic was heated and afterward the patient was in a  seated position and the orthotic was molded. The patient was positioned in subtalar neutral position and 10 degrees of ankle dorsiflexion in a weight bearing stance. After completion of molding, patient did have orthotic management The blank was ground to a stable position for weight bearing. Size: 7 (6.5/7/5 Igli Alround)  Base: Carbon fiber Additional Posting and Padding: Silicone posting for transverse arch support as well as 250/100 silicone post to support longitudinal arch and maintain talar neutral.  The patient ambulated these, and they were very comfortable. Patient was advised as to signs of discomfort to be expected when breaking in the new orthotics, as well as instructions on when to contact the office if there were signs of irritation or ill fit.  Patient will be seen in office in two weeks for a follow up but was told to contact us earlier if there are any concerns or issues with the custom orthotics.

## 2015-02-02 NOTE — Assessment & Plan Note (Signed)
Patient was put into the orthotics today and did tolerate the procedure very well. Patient will come back and see me again in 2-3 weeks for further evaluation and treatment.

## 2015-02-16 ENCOUNTER — Ambulatory Visit (INDEPENDENT_AMBULATORY_CARE_PROVIDER_SITE_OTHER): Payer: PRIVATE HEALTH INSURANCE | Admitting: Family Medicine

## 2015-02-16 ENCOUNTER — Encounter: Payer: Self-pay | Admitting: Family Medicine

## 2015-02-16 VITALS — BP 130/84 | HR 90 | Ht 61.0 in | Wt 170.0 lb

## 2015-02-16 DIAGNOSIS — M7061 Trochanteric bursitis, right hip: Secondary | ICD-10-CM

## 2015-02-16 DIAGNOSIS — M7062 Trochanteric bursitis, left hip: Secondary | ICD-10-CM

## 2015-02-16 NOTE — Progress Notes (Signed)
Pre visit review using our clinic review tool, if applicable. No additional management support is needed unless otherwise documented below in the visit note. 

## 2015-02-16 NOTE — Assessment & Plan Note (Signed)
Patient is doing much better after the injection. We discussed icing and home exercises. We discussed continuing this for quite a while. Patient will continue with the natural supplementations. Patient come back and see me again as needed.

## 2015-02-16 NOTE — Progress Notes (Signed)
  Tawana ScaleZach Deeandra Jerry D.O.  [facility] [address] [place], [place] [address] Phone: [phone] Subjective:     CC: low back pain follow up of bilateral hip pain   BJY:NWGNFAOZHYHPI:Subjective Jenna Johnson is a 59 y.o. female coming in with complaint of mid as well as low back pain. Patient was seen and had more of the bilateral greater trochanteric bursitis. Patient was given injections in both hips with good resolution of pain and first. In addition of this patient was put in custom orthotics to try to avoid the overpronation patient was having which would put increasing stress on the outside of her hips. Patient states she is feeling significantly better. Patient states that she is about 95% better. Mild discomfort on the right hip still but overall nothing that is stopping her from activity. Patient is in custom orthotics now and is feeling much better as well.     patient's previous x-rays of her back is unremarkable except for some mild to moderate mostly of the lower thoracic spine no. arthritis  Past medical history, social, surgical and family history all reviewed in electronic medical record.   Review of Systems: No headache, visual changes, nausea, vomiting, diarrhea, constipation, dizziness, abdominal pain, skin rash, fevers, chills, night sweats, weight loss, swollen lymph nodes, body aches, joint swelling, muscle aches, chest pain, shortness of breath, mood changes.   Objective Blood pressure 130/84, pulse 90, height 5\' 1"  (1.549 m), weight 170 lb (77.111 kg), SpO2 97 %.  General: No apparent distress alert and oriented x3 mood and affect normal, dressed appropriately.  HEENT: Pupils equal, extraocular movements intact  Respiratory: Patient's speak in full sentences and does not appear short of breath  Cardiovascular: No lower extremity edema, non tender, no erythema  Skin: Warm dry intact with no signs of infection or rash on extremities or on axial skeleton.  Abdomen: Soft  nontender  Neuro: Cranial nerves II through XII are intact, neurovascularly intact in all extremities with 2+ DTRs and 2+ pulses.  Lymph: No lymphadenopathy of posterior or anterior cervical chain or axillae bilaterally.  Gait normal with good balance and coordination.  MSK:  Non tender with full range of motion and good stability and symmetric strength and tone of shoulders, elbows, wrist, , knee and ankles bilaterally.   Foot exam shows overpronation the hindfoot with pes planus bilaterally.  QMV:HQIONGEXBHip:bilateral ROM IR: 25 Deg, ER: 45 Deg, Flexion: 120 Deg, Extension: 100 Deg, Abduction: 45 Deg, Adduction: 45 Deg Strength IR: 5/5, ER: 5/5, Flexion: 5/5, Extension: 5/5, Abduction: 3+/5, Adduction: 5/5 Pelvic alignment unremarkable to inspection and palpation. Standing hip rotation and gait without trendelenburg sign / unsteadiness. Nontender over the greater trochanteric area Positive Faber No SI joint tenderness and normal minimal SI movement.    Impression and Recommendations:     This case required medical decision making of moderate complexity.

## 2015-02-16 NOTE — Patient Instructions (Addendum)
Great to see you  Jenna Johnson is your friend Try arnica gel on the side of the hip Pennsaid twice daily when needed Ok to wear other shoes without the orthotic See me again when you need me

## 2015-02-26 ENCOUNTER — Other Ambulatory Visit (INDEPENDENT_AMBULATORY_CARE_PROVIDER_SITE_OTHER): Payer: PRIVATE HEALTH INSURANCE

## 2015-02-26 DIAGNOSIS — N390 Urinary tract infection, site not specified: Secondary | ICD-10-CM | POA: Diagnosis not present

## 2015-02-26 DIAGNOSIS — R319 Hematuria, unspecified: Secondary | ICD-10-CM

## 2015-02-26 LAB — URINALYSIS, ROUTINE W REFLEX MICROSCOPIC
Bilirubin Urine: NEGATIVE
Ketones, ur: NEGATIVE
Leukocytes, UA: NEGATIVE
Nitrite: NEGATIVE
PH: 5.5 (ref 5.0–8.0)
SPECIFIC GRAVITY, URINE: 1.025 (ref 1.000–1.030)
TOTAL PROTEIN, URINE-UPE24: NEGATIVE
URINE GLUCOSE: NEGATIVE
Urobilinogen, UA: 0.2 (ref 0.0–1.0)

## 2015-03-20 ENCOUNTER — Other Ambulatory Visit: Payer: Self-pay

## 2015-03-20 DIAGNOSIS — Z1231 Encounter for screening mammogram for malignant neoplasm of breast: Secondary | ICD-10-CM

## 2015-03-26 ENCOUNTER — Ambulatory Visit: Payer: PRIVATE HEALTH INSURANCE

## 2015-03-26 ENCOUNTER — Ambulatory Visit
Admission: RE | Admit: 2015-03-26 | Discharge: 2015-03-26 | Disposition: A | Discharge status: Home / Self Care | Payer: PRIVATE HEALTH INSURANCE | Source: Ambulatory Visit

## 2015-03-26 DIAGNOSIS — Z1231 Encounter for screening mammogram for malignant neoplasm of breast: Secondary | ICD-10-CM

## 2015-07-13 ENCOUNTER — Encounter: Payer: Self-pay | Admitting: Family Medicine

## 2015-07-13 ENCOUNTER — Other Ambulatory Visit (HOSPITAL_COMMUNITY)
Admission: RE | Admit: 2015-07-13 | Discharge: 2015-07-13 | Disposition: A | Discharge status: Home / Self Care | Payer: PRIVATE HEALTH INSURANCE | Source: Ambulatory Visit | Attending: Family Medicine | Admitting: Family Medicine

## 2015-07-13 ENCOUNTER — Ambulatory Visit (INDEPENDENT_AMBULATORY_CARE_PROVIDER_SITE_OTHER): Payer: PRIVATE HEALTH INSURANCE | Admitting: Family Medicine

## 2015-07-13 VITALS — BP 120/80 | HR 76 | Temp 97.7°F | Ht 60.0 in | Wt 173.4 lb

## 2015-07-13 DIAGNOSIS — Z01419 Encounter for gynecological examination (general) (routine) without abnormal findings: Secondary | ICD-10-CM | POA: Insufficient documentation

## 2015-07-13 DIAGNOSIS — I10 Essential (primary) hypertension: Secondary | ICD-10-CM | POA: Diagnosis not present

## 2015-07-13 DIAGNOSIS — Z0000 Encounter for general adult medical examination without abnormal findings: Secondary | ICD-10-CM | POA: Diagnosis not present

## 2015-07-13 DIAGNOSIS — Z1272 Encounter for screening for malignant neoplasm of vagina: Secondary | ICD-10-CM | POA: Diagnosis not present

## 2015-07-13 DIAGNOSIS — K219 Gastro-esophageal reflux disease without esophagitis: Secondary | ICD-10-CM

## 2015-07-13 DIAGNOSIS — D508 Other iron deficiency anemias: Secondary | ICD-10-CM

## 2015-07-13 NOTE — Patient Instructions (Signed)
Preventive Care for Adults A healthy lifestyle and preventive care can promote health and wellness. Preventive health guidelines for women include the following key practices.  A routine yearly physical is a good way to check with your health care provider about your health and preventive screening. It is a chance to share any concerns and updates on your health and to receive a thorough exam.  Visit your dentist for a routine exam and preventive care every 6 months. Brush your teeth twice a day and floss once a day. Good oral hygiene prevents tooth decay and gum disease.  The frequency of eye exams is based on your age, health, family medical history, use of contact lenses, and other factors. Follow your health care provider's recommendations for frequency of eye exams.  Eat a healthy diet. Foods like vegetables, fruits, whole grains, low-fat dairy products, and lean protein foods contain the nutrients you need without too many calories. Decrease your intake of foods high in solid fats, added sugars, and salt. Eat the right amount of calories for you.Get information about a proper diet from your health care provider, if necessary.  Regular physical exercise is one of the most important things you can do for your health. Most adults should get at least 150 minutes of moderate-intensity exercise (any activity that increases your heart rate and causes you to sweat) each week. In addition, most adults need muscle-strengthening exercises on 2 or more days a week.  Maintain a healthy weight. The body mass index (BMI) is a screening tool to identify possible weight problems. It provides an estimate of body fat based on height and weight. Your health care provider can find your BMI and can help you achieve or maintain a healthy weight.For adults 20 years and older:  A BMI below 18.5 is considered underweight.  A BMI of 18.5 to 24.9 is normal.  A BMI of 25 to 29.9 is considered overweight.  A BMI of  30 and above is considered obese.  Maintain normal blood lipids and cholesterol levels by exercising and minimizing your intake of saturated fat. Eat a balanced diet with plenty of fruit and vegetables. Blood tests for lipids and cholesterol should begin at age 76 and be repeated every 5 years. If your lipid or cholesterol levels are high, you are over 50, or you are at high risk for heart disease, you may need your cholesterol levels checked more frequently.Ongoing high lipid and cholesterol levels should be treated with medicines if diet and exercise are not working.  If you smoke, find out from your health care provider how to quit. If you do not use tobacco, do not start.  Lung cancer screening is recommended for adults aged 22-80 years who are at high risk for developing lung cancer because of a history of smoking. A yearly low-dose CT scan of the lungs is recommended for people who have at least a 30-pack-year history of smoking and are a current smoker or have quit within the past 15 years. A pack year of smoking is smoking an average of 1 pack of cigarettes a day for 1 year (for example: 1 pack a day for 30 years or 2 packs a day for 15 years). Yearly screening should continue until the smoker has stopped smoking for at least 15 years. Yearly screening should be stopped for people who develop a health problem that would prevent them from having lung cancer treatment.  If you are pregnant, do not drink alcohol. If you are breastfeeding,  be very cautious about drinking alcohol. If you are not pregnant and choose to drink alcohol, do not have more than 1 drink per day. One drink is considered to be 12 ounces (355 mL) of beer, 5 ounces (148 mL) of wine, or 1.5 ounces (44 mL) of liquor.  Avoid use of street drugs. Do not share needles with anyone. Ask for help if you need support or instructions about stopping the use of drugs.  High blood pressure causes heart disease and increases the risk of  stroke. Your blood pressure should be checked at least every 1 to 2 years. Ongoing high blood pressure should be treated with medicines if weight loss and exercise do not work.  If you are 75-52 years old, ask your health care provider if you should take aspirin to prevent strokes.  Diabetes screening involves taking a blood sample to check your fasting blood sugar level. This should be done once every 3 years, after age 15, if you are within normal weight and without risk factors for diabetes. Testing should be considered at a younger age or be carried out more frequently if you are overweight and have at least 1 risk factor for diabetes.  Breast cancer screening is essential preventive care for women. You should practice "breast self-awareness." This means understanding the normal appearance and feel of your breasts and may include breast self-examination. Any changes detected, no matter how small, should be reported to a health care provider. Women in their 58s and 30s should have a clinical breast exam (CBE) by a health care provider as part of a regular health exam every 1 to 3 years. After age 16, women should have a CBE every year. Starting at age 53, women should consider having a mammogram (breast X-ray test) every year. Women who have a family history of breast cancer should talk to their health care provider about genetic screening. Women at a high risk of breast cancer should talk to their health care providers about having an MRI and a mammogram every year.  Breast cancer gene (BRCA)-related cancer risk assessment is recommended for women who have family members with BRCA-related cancers. BRCA-related cancers include breast, ovarian, tubal, and peritoneal cancers. Having family members with these cancers may be associated with an increased risk for harmful changes (mutations) in the breast cancer genes BRCA1 and BRCA2. Results of the assessment will determine the need for genetic counseling and  BRCA1 and BRCA2 testing.  Routine pelvic exams to screen for cancer are no longer recommended for nonpregnant women who are considered low risk for cancer of the pelvic organs (ovaries, uterus, and vagina) and who do not have symptoms. Ask your health care provider if a screening pelvic exam is right for you.  If you have had past treatment for cervical cancer or a condition that could lead to cancer, you need Pap tests and screening for cancer for at least 20 years after your treatment. If Pap tests have been discontinued, your risk factors (such as having a new sexual partner) need to be reassessed to determine if screening should be resumed. Some women have medical problems that increase the chance of getting cervical cancer. In these cases, your health care provider may recommend more frequent screening and Pap tests.  The HPV test is an additional test that may be used for cervical cancer screening. The HPV test looks for the virus that can cause the cell changes on the cervix. The cells collected during the Pap test can be  tested for HPV. The HPV test could be used to screen women aged 30 years and older, and should be used in women of any age who have unclear Pap test results. After the age of 30, women should have HPV testing at the same frequency as a Pap test.  Colorectal cancer can be detected and often prevented. Most routine colorectal cancer screening begins at the age of 50 years and continues through age 75 years. However, your health care provider may recommend screening at an earlier age if you have risk factors for colon cancer. On a yearly basis, your health care provider may provide home test kits to check for hidden blood in the stool. Use of a small camera at the end of a tube, to directly examine the colon (sigmoidoscopy or colonoscopy), can detect the earliest forms of colorectal cancer. Talk to your health care provider about this at age 50, when routine screening begins. Direct  exam of the colon should be repeated every 5-10 years through age 75 years, unless early forms of pre-cancerous polyps or small growths are found.  People who are at an increased risk for hepatitis B should be screened for this virus. You are considered at high risk for hepatitis B if:  You were born in a country where hepatitis B occurs often. Talk with your health care provider about which countries are considered high risk.  Your parents were born in a high-risk country and you have not received a shot to protect against hepatitis B (hepatitis B vaccine).  You have HIV or AIDS.  You use needles to inject street drugs.  You live with, or have sex with, someone who has hepatitis B.  You get hemodialysis treatment.  You take certain medicines for conditions like cancer, organ transplantation, and autoimmune conditions.  Hepatitis C blood testing is recommended for all people born from 1945 through 1965 and any individual with known risks for hepatitis C.  Practice safe sex. Use condoms and avoid high-risk sexual practices to reduce the spread of sexually transmitted infections (STIs). STIs include gonorrhea, chlamydia, syphilis, trichomonas, herpes, HPV, and human immunodeficiency virus (HIV). Herpes, HIV, and HPV are viral illnesses that have no cure. They can result in disability, cancer, and death.  You should be screened for sexually transmitted illnesses (STIs) including gonorrhea and chlamydia if:  You are sexually active and are younger than 24 years.  You are older than 24 years and your health care provider tells you that you are at risk for this type of infection.  Your sexual activity has changed since you were last screened and you are at an increased risk for chlamydia or gonorrhea. Ask your health care provider if you are at risk.  If you are at risk of being infected with HIV, it is recommended that you take a prescription medicine daily to prevent HIV infection. This is  called preexposure prophylaxis (PrEP). You are considered at risk if:  You are a heterosexual woman, are sexually active, and are at increased risk for HIV infection.  You take drugs by injection.  You are sexually active with a partner who has HIV.  Talk with your health care provider about whether you are at high risk of being infected with HIV. If you choose to begin PrEP, you should first be tested for HIV. You should then be tested every 3 months for as long as you are taking PrEP.  Osteoporosis is a disease in which the bones lose minerals and strength   with aging. This can result in serious bone fractures or breaks. The risk of osteoporosis can be identified using a bone density scan. Women ages 65 years and over and women at risk for fractures or osteoporosis should discuss screening with their health care providers. Ask your health care provider whether you should take a calcium supplement or vitamin D to reduce the rate of osteoporosis.  Menopause can be associated with physical symptoms and risks. Hormone replacement therapy is available to decrease symptoms and risks. You should talk to your health care provider about whether hormone replacement therapy is right for you.  Use sunscreen. Apply sunscreen liberally and repeatedly throughout the day. You should seek shade when your shadow is shorter than you. Protect yourself by wearing long sleeves, pants, a wide-brimmed hat, and sunglasses year round, whenever you are outdoors.  Once a month, do a whole body skin exam, using a mirror to look at the skin on your back. Tell your health care provider of new moles, moles that have irregular borders, moles that are larger than a pencil eraser, or moles that have changed in shape or color.  Stay current with required vaccines (immunizations).  Influenza vaccine. All adults should be immunized every year.  Tetanus, diphtheria, and acellular pertussis (Td, Tdap) vaccine. Pregnant women should  receive 1 dose of Tdap vaccine during each pregnancy. The dose should be obtained regardless of the length of time since the last dose. Immunization is preferred during the 27th-36th week of gestation. An adult who has not previously received Tdap or who does not know her vaccine status should receive 1 dose of Tdap. This initial dose should be followed by tetanus and diphtheria toxoids (Td) booster doses every 10 years. Adults with an unknown or incomplete history of completing a 3-dose immunization series with Td-containing vaccines should begin or complete a primary immunization series including a Tdap dose. Adults should receive a Td booster every 10 years.  Varicella vaccine. An adult without evidence of immunity to varicella should receive 2 doses or a second dose if she has previously received 1 dose. Pregnant females who do not have evidence of immunity should receive the first dose after pregnancy. This first dose should be obtained before leaving the health care facility. The second dose should be obtained 4-8 weeks after the first dose.  Human papillomavirus (HPV) vaccine. Females aged 13-26 years who have not received the vaccine previously should obtain the 3-dose series. The vaccine is not recommended for use in pregnant females. However, pregnancy testing is not needed before receiving a dose. If a female is found to be pregnant after receiving a dose, no treatment is needed. In that case, the remaining doses should be delayed until after the pregnancy. Immunization is recommended for any person with an immunocompromised condition through the age of 26 years if she did not get any or all doses earlier. During the 3-dose series, the second dose should be obtained 4-8 weeks after the first dose. The third dose should be obtained 24 weeks after the first dose and 16 weeks after the second dose.  Zoster vaccine. One dose is recommended for adults aged 60 years or older unless certain conditions are  present.  Measles, mumps, and rubella (MMR) vaccine. Adults born before 1957 generally are considered immune to measles and mumps. Adults born in 1957 or later should have 1 or more doses of MMR vaccine unless there is a contraindication to the vaccine or there is laboratory evidence of immunity to   each of the three diseases. A routine second dose of MMR vaccine should be obtained at least 28 days after the first dose for students attending postsecondary schools, health care workers, or international travelers. People who received inactivated measles vaccine or an unknown type of measles vaccine during 1963-1967 should receive 2 doses of MMR vaccine. People who received inactivated mumps vaccine or an unknown type of mumps vaccine before 1979 and are at high risk for mumps infection should consider immunization with 2 doses of MMR vaccine. For females of childbearing age, rubella immunity should be determined. If there is no evidence of immunity, females who are not pregnant should be vaccinated. If there is no evidence of immunity, females who are pregnant should delay immunization until after pregnancy. Unvaccinated health care workers born before 1957 who lack laboratory evidence of measles, mumps, or rubella immunity or laboratory confirmation of disease should consider measles and mumps immunization with 2 doses of MMR vaccine or rubella immunization with 1 dose of MMR vaccine.  Pneumococcal 13-valent conjugate (PCV13) vaccine. When indicated, a person who is uncertain of her immunization history and has no record of immunization should receive the PCV13 vaccine. An adult aged 19 years or older who has certain medical conditions and has not been previously immunized should receive 1 dose of PCV13 vaccine. This PCV13 should be followed with a dose of pneumococcal polysaccharide (PPSV23) vaccine. The PPSV23 vaccine dose should be obtained at least 8 weeks after the dose of PCV13 vaccine. An adult aged 19  years or older who has certain medical conditions and previously received 1 or more doses of PPSV23 vaccine should receive 1 dose of PCV13. The PCV13 vaccine dose should be obtained 1 or more years after the last PPSV23 vaccine dose.  Pneumococcal polysaccharide (PPSV23) vaccine. When PCV13 is also indicated, PCV13 should be obtained first. All adults aged 65 years and older should be immunized. An adult younger than age 65 years who has certain medical conditions should be immunized. Any person who resides in a nursing home or long-term care facility should be immunized. An adult smoker should be immunized. People with an immunocompromised condition and certain other conditions should receive both PCV13 and PPSV23 vaccines. People with human immunodeficiency virus (HIV) infection should be immunized as soon as possible after diagnosis. Immunization during chemotherapy or radiation therapy should be avoided. Routine use of PPSV23 vaccine is not recommended for American Indians, [place] Natives, or people younger than 65 years unless there are medical conditions that require PPSV23 vaccine. When indicated, people who have unknown immunization and have no record of immunization should receive PPSV23 vaccine. One-time revaccination 5 years after the first dose of PPSV23 is recommended for people aged 19-64 years who have chronic kidney failure, nephrotic syndrome, asplenia, or immunocompromised conditions. People who received 1-2 doses of PPSV23 before age 65 years should receive another dose of PPSV23 vaccine at age 65 years or later if at least 5 years have passed since the previous dose. Doses of PPSV23 are not needed for people immunized with PPSV23 at or after age 65 years.  Meningococcal vaccine. Adults with asplenia or persistent complement component deficiencies should receive 2 doses of quadrivalent meningococcal conjugate (MenACWY-D) vaccine. The doses should be obtained at least 2 months apart.  Microbiologists working with certain meningococcal bacteria, military recruits, people at risk during an outbreak, and people who travel to or live in countries with a high rate of meningitis should be immunized. A first-year college student up through age   21 years who is living in a residence hall should receive a dose if she did not receive a dose on or after her 16th birthday. Adults who have certain high-risk conditions should receive one or more doses of vaccine.  Hepatitis A vaccine. Adults who wish to be protected from this disease, have certain high-risk conditions, work with hepatitis A-infected animals, work in hepatitis A research labs, or travel to or work in countries with a high rate of hepatitis A should be immunized. Adults who were previously unvaccinated and who anticipate close contact with an international adoptee during the first 60 days after arrival in the [place] from a country with a high rate of hepatitis A should be immunized.  Hepatitis B vaccine. Adults who wish to be protected from this disease, have certain high-risk conditions, may be exposed to blood or other infectious body fluids, are household contacts or sex partners of hepatitis B positive people, are clients or workers in certain care facilities, or travel to or work in countries with a high rate of hepatitis B should be immunized.  Haemophilus influenzae type b (Hib) vaccine. A previously unvaccinated person with asplenia or sickle cell disease or having a scheduled splenectomy should receive 1 dose of Hib vaccine. Regardless of previous immunization, a recipient of a hematopoietic stem cell transplant should receive a 3-dose series 6-12 months after her successful transplant. Hib vaccine is not recommended for adults with HIV infection. Preventive Services / Frequency Ages 64 to 68 years  Blood pressure check.** / Every 1 to 2 years.  Lipid and cholesterol check.** / Every 5 years beginning at age  22.  Clinical breast exam.** / Every 3 years for women in their 88s and 53s.  BRCA-related cancer risk assessment.** / For women who have family members with a BRCA-related cancer (breast, ovarian, tubal, or peritoneal cancers).  Pap test.** / Every 2 years from ages 90 through 51. Every 3 years starting at age 21 through age 56 or 3 with a history of 3 consecutive normal Pap tests.  HPV screening.** / Every 3 years from ages 24 through ages 1 to 46 with a history of 3 consecutive normal Pap tests.  Hepatitis C blood test.** / For any individual with known risks for hepatitis C.  Skin self-exam. / Monthly.  Influenza vaccine. / Every year.  Tetanus, diphtheria, and acellular pertussis (Tdap, Td) vaccine.** / Consult your health care provider. Pregnant women should receive 1 dose of Tdap vaccine during each pregnancy. 1 dose of Td every 10 years.  Varicella vaccine.** / Consult your health care provider. Pregnant females who do not have evidence of immunity should receive the first dose after pregnancy.  HPV vaccine. / 3 doses over 6 months, if 72 and younger. The vaccine is not recommended for use in pregnant females. However, pregnancy testing is not needed before receiving a dose.  Measles, mumps, rubella (MMR) vaccine.** / You need at least 1 dose of MMR if you were born in 1957 or later. You may also need a 2nd dose. For females of childbearing age, rubella immunity should be determined. If there is no evidence of immunity, females who are not pregnant should be vaccinated. If there is no evidence of immunity, females who are pregnant should delay immunization until after pregnancy.  Pneumococcal 13-valent conjugate (PCV13) vaccine.** / Consult your health care provider.  Pneumococcal polysaccharide (PPSV23) vaccine.** / 1 to 2 doses if you smoke cigarettes or if you have certain conditions.  Meningococcal vaccine.** /  1 dose if you are age 19 to 21 years and a first-year college  student living in a residence hall, or have one of several medical conditions, you need to get vaccinated against meningococcal disease. You may also need additional booster doses.  Hepatitis A vaccine.** / Consult your health care provider.  Hepatitis B vaccine.** / Consult your health care provider.  Haemophilus influenzae type b (Hib) vaccine.** / Consult your health care provider. Ages 40 to 64 years  Blood pressure check.** / Every 1 to 2 years.  Lipid and cholesterol check.** / Every 5 years beginning at age 20 years.  Lung cancer screening. / Every year if you are aged 55-80 years and have a 30-pack-year history of smoking and currently smoke or have quit within the past 15 years. Yearly screening is stopped once you have quit smoking for at least 15 years or develop a health problem that would prevent you from having lung cancer treatment.  Clinical breast exam.** / Every year after age 40 years.  BRCA-related cancer risk assessment.** / For women who have family members with a BRCA-related cancer (breast, ovarian, tubal, or peritoneal cancers).  Mammogram.** / Every year beginning at age 40 years and continuing for as long as you are in good health. Consult with your health care provider.  Pap test.** / Every 3 years starting at age 30 years through age 65 or 70 years with a history of 3 consecutive normal Pap tests.  HPV screening.** / Every 3 years from ages 30 years through ages 65 to 70 years with a history of 3 consecutive normal Pap tests.  Fecal occult blood test (FOBT) of stool. / Every year beginning at age 50 years and continuing until age 75 years. You may not need to do this test if you get a colonoscopy every 10 years.  Flexible sigmoidoscopy or colonoscopy.** / Every 5 years for a flexible sigmoidoscopy or every 10 years for a colonoscopy beginning at age 50 years and continuing until age 75 years.  Hepatitis C blood test.** / For all people born from 1945 through  1965 and any individual with known risks for hepatitis C.  Skin self-exam. / Monthly.  Influenza vaccine. / Every year.  Tetanus, diphtheria, and acellular pertussis (Tdap/Td) vaccine.** / Consult your health care provider. Pregnant women should receive 1 dose of Tdap vaccine during each pregnancy. 1 dose of Td every 10 years.  Varicella vaccine.** / Consult your health care provider. Pregnant females who do not have evidence of immunity should receive the first dose after pregnancy.  Zoster vaccine.** / 1 dose for adults aged 60 years or older.  Measles, mumps, rubella (MMR) vaccine.** / You need at least 1 dose of MMR if you were born in 1957 or later. You may also need a 2nd dose. For females of childbearing age, rubella immunity should be determined. If there is no evidence of immunity, females who are not pregnant should be vaccinated. If there is no evidence of immunity, females who are pregnant should delay immunization until after pregnancy.  Pneumococcal 13-valent conjugate (PCV13) vaccine.** / Consult your health care provider.  Pneumococcal polysaccharide (PPSV23) vaccine.** / 1 to 2 doses if you smoke cigarettes or if you have certain conditions.  Meningococcal vaccine.** / Consult your health care provider.  Hepatitis A vaccine.** / Consult your health care provider.  Hepatitis B vaccine.** / Consult your health care provider.  Haemophilus influenzae type b (Hib) vaccine.** / Consult your health care provider. Ages 65   years and over  Blood pressure check.** / Every 1 to 2 years.  Lipid and cholesterol check.** / Every 5 years beginning at age 22 years.  Lung cancer screening. / Every year if you are aged 73-80 years and have a 30-pack-year history of smoking and currently smoke or have quit within the past 15 years. Yearly screening is stopped once you have quit smoking for at least 15 years or develop a health problem that would prevent you from having lung cancer  treatment.  Clinical breast exam.** / Every year after age 4 years.  BRCA-related cancer risk assessment.** / For women who have family members with a BRCA-related cancer (breast, ovarian, tubal, or peritoneal cancers).  Mammogram.** / Every year beginning at age 40 years and continuing for as long as you are in good health. Consult with your health care provider.  Pap test.** / Every 3 years starting at age 9 years through age 34 or 91 years with 3 consecutive normal Pap tests. Testing can be stopped between 65 and 70 years with 3 consecutive normal Pap tests and no abnormal Pap or HPV tests in the past 10 years.  HPV screening.** / Every 3 years from ages 57 years through ages 64 or 45 years with a history of 3 consecutive normal Pap tests. Testing can be stopped between 65 and 70 years with 3 consecutive normal Pap tests and no abnormal Pap or HPV tests in the past 10 years.  Fecal occult blood test (FOBT) of stool. / Every year beginning at age 15 years and continuing until age 17 years. You may not need to do this test if you get a colonoscopy every 10 years.  Flexible sigmoidoscopy or colonoscopy.** / Every 5 years for a flexible sigmoidoscopy or every 10 years for a colonoscopy beginning at age 86 years and continuing until age 71 years.  Hepatitis C blood test.** / For all people born from [birth year] through 1963 and any individual with known risks for hepatitis C.  Osteoporosis screening.** / A one-time screening for women ages 83 years and over and women at risk for fractures or osteoporosis.  Skin self-exam. / Monthly.  Influenza vaccine. / Every year.  Tetanus, diphtheria, and acellular pertussis (Tdap/Td) vaccine.** / 1 dose of Td every 10 years.  Varicella vaccine.** / Consult your health care provider.  Zoster vaccine.** / 1 dose for adults aged 61 years or older.  Pneumococcal 13-valent conjugate (PCV13) vaccine.** / Consult your health care provider.  Pneumococcal  polysaccharide (PPSV23) vaccine.** / 1 dose for all adults aged 28 years and older.  Meningococcal vaccine.** / Consult your health care provider.  Hepatitis A vaccine.** / Consult your health care provider.  Hepatitis B vaccine.** / Consult your health care provider.  Haemophilus influenzae type b (Hib) vaccine.** / Consult your health care provider. ** Family history and personal history of risk and conditions may change your health care provider's recommendations. Document Released: 01/17/2002 Document Revised: 04/07/2014 Document Reviewed: 04/18/2011 [facility] Patient Information 2015 [place], [place]. This information is not intended to replace advice given to you by your health care provider. Make sure you discuss any questions you have with your health care provider.

## 2015-07-13 NOTE — Progress Notes (Signed)
Pre visit review using our clinic review tool, if applicable. No additional management support is needed unless otherwise documented below in the visit note. 

## 2015-07-14 LAB — CYTOLOGY - PAP

## 2015-07-26 ENCOUNTER — Encounter: Payer: Self-pay | Admitting: Family Medicine

## 2015-07-26 NOTE — Assessment & Plan Note (Signed)
Patient encouraged to maintain heart healthy diet, regular exercise, adequate sleep. Consider daily probiotics. Take medications as prescribed. Given and reviewed copy of ACP documents from [employer] and encouraged to complete and return. Pap taken

## 2015-07-26 NOTE — Assessment & Plan Note (Signed)
Well controlled, no changes to meds. Encouraged heart healthy diet such as the DASH diet and exercise as tolerated.  °

## 2015-07-26 NOTE — Assessment & Plan Note (Signed)
Avoid offending foods, take probiotics. Do not eat large meals in late evening and consider raising head of bed.  

## 2015-07-26 NOTE — Progress Notes (Signed)
Jenna Johnson [id number] [birth year] 07/26/2015      Progress Note New Patient  Subjective  Chief Complaint  Chief Complaint  Patient presents with  . Follow-up    pap smear    HPI  Patient is a 59 year old female in today for routine medical care. She is in today for annual exam. No recent illness or acute concerns. Is in need of a pap and offers no gyn complaints, recently had a MGM which was normal. Noted a small tremor in her left hand in 2001, recently worsened. Not affecting ADLs yet. Denies CP/palp/SOB/HA/congestion/fevers/GI or GU c/o. Taking meds as prescribed. C/o a small lesion on left arm, firm, nontender, small  Past Medical History  Diagnosis Date  . Chicken pox as a child  . Measles as a child  . Allergy   . Anemia   . Hypertension   . UTI (urinary tract infection)   . GERD (gastroesophageal reflux disease)   . TIA (transient ischemic attack) 2004  . Lactose intolerance   . H/O atrial septal defect   . Endometriosis   . Allergic state   . Hip pain, bilateral 09/18/2013  . Myalgia and myositis 09/18/2013  . Dermatitis 09/18/2013  . TIA (transient ischemic attack) 09/18/2013  . Preventative health care 01/07/2014    Past Surgical History  Procedure Laterality Date  . Appendectomy  59 yrs old  . Tia  surgery 2005  . Hole in heart    . Asd repair  2006  . Abdominal hysterectomy  59 yrs old    still has one ovary  . Right oophorectomy      ruptured ovarian cyst at age 18    Family History  Problem Relation Age of Onset  . Multiple sclerosis Mother   . Other Mother     blue tumor, air embolism,  . Diabetes Father 85    type 2  . Other Father     fatty liver, lactose intolerant  . Cancer Paternal Grandmother 45    breast  . Diabetes Paternal Grandmother   . Other Sister     s/p hysterectomy  . Other Sister     s/p hysterectomy    Social History   Social History  . Marital Status: Married    Spouse Name: N/A  . Number of Children:  N/A  . Years of Education: N/A   Occupational History  . Not on file.   Social History Main Topics  . Smoking status: Never Smoker   . Smokeless tobacco: Never Used  . Alcohol Use: Yes     Comment: occasionally  . Drug Use: No  . Sexual Activity:    Partners: Male   Other Topics Concern  . Not on file   Social History Narrative    Current Outpatient Prescriptions on File Prior to Visit  Medication Sig Dispense Refill  . aspirin EC 81 MG tablet Take 1 tablet (81 mg total) by mouth daily.    . Black Cohosh 540 MG CAPS Take by mouth daily.    . Calcium Carb-Cholecalciferol (CALCIUM 600 + D PO) Take by mouth daily.    . cetirizine (ZYRTEC) 10 MG tablet Take 20 mg by mouth daily.    Marland Kitchen guaifenesin (HUMIBID E) 400 MG TABS tablet Take 400 mg by mouth every 4 (four) hours.    Marland Kitchen loratadine (CLARITIN) 10 MG tablet Take 10 mg by mouth daily.    . Magnesium 250 MG TABS Take 2 tablets by mouth daily.    Marland Kitchen  OVER THE COUNTER MEDICATION [place] Rhubarb- 1 capsule daily    . ranitidine (ZANTAC) 300 MG tablet Take 1 tablet (300 mg total) by mouth at bedtime as needed for heartburn. 30 tablet 3   No current facility-administered medications on file prior to visit.    No Known Allergies  Review of Systems  Review of Systems  Constitutional: Negative for fever, chills and malaise/fatigue.  HENT: Negative for congestion and hearing loss.   Eyes: Negative for discharge.  Respiratory: Negative for cough, sputum production and shortness of breath.   Cardiovascular: Negative for chest pain, palpitations and leg swelling.  Gastrointestinal: Negative for heartburn, nausea, vomiting, abdominal pain, diarrhea, constipation and blood in stool.  Genitourinary: Negative for dysuria, urgency, frequency and hematuria.  Musculoskeletal: Negative for myalgias, back pain and falls.  Skin: Negative for rash.  Neurological: Negative for dizziness, sensory change, loss of consciousness, weakness and  headaches.  Endo/Heme/Allergies: Negative for environmental allergies. Does not bruise/bleed easily.  Psychiatric/Behavioral: Negative for depression and suicidal ideas. The patient is not nervous/anxious and does not have insomnia.     Objective  BP 120/80 mmHg  Pulse 76  Temp(Src) 97.7 F (36.5 C) (Oral)  Ht 5' (1.524 m)  Wt 173 lb 6 oz (78.642 kg)  BMI 33.86 kg/m2  SpO2 96%  Physical Exam  Physical Exam  Constitutional: She is oriented to person, place, and time and well-developed, well-nourished, and in no distress. No distress.  HENT:  Head: Normocephalic and atraumatic.  Right Ear: External ear normal.  Left Ear: External ear normal.  Nose: Nose normal.  Mouth/Throat: Oropharynx is clear and moist. No oropharyngeal exudate.  Eyes: Conjunctivae are normal. Pupils are equal, round, and reactive to light. Right eye exhibits no discharge. Left eye exhibits no discharge. No scleral icterus.  Neck: Normal range of motion. Neck supple. No thyromegaly present.  Cardiovascular: Normal rate, regular rhythm, normal heart sounds and intact distal pulses.   No murmur heard. Pulmonary/Chest: Effort normal and breath sounds normal. No respiratory distress. She has no wheezes. She has no rales.  Abdominal: Soft. Bowel sounds are normal. She exhibits no distension and no mass. There is no tenderness.  Genitourinary: Vagina normal, right adnexa normal and left adnexa normal. No vaginal discharge found.  Breast exam without masses, skin changes, discharge b/l  Musculoskeletal: Normal range of motion. She exhibits no edema or tenderness.  Lymphadenopathy:    She has no cervical adenopathy.  Neurological: She is alert and oriented to person, place, and time. She has normal reflexes. No cranial nerve deficit. Coordination normal.  Skin: Skin is warm and dry. No rash noted. She is not diaphoretic.  Psychiatric: Mood, memory and affect normal.       Assessment & Plan  GERD  (gastroesophageal reflux disease) Avoid offending foods, take probiotics. Do not eat large meals in late evening and consider raising head of bed.   Hypertension Well controlled, no changes to meds. Encouraged heart healthy diet such as the DASH diet and exercise as tolerated.   Preventative health care Patient encouraged to maintain heart healthy diet, regular exercise, adequate sleep. Consider daily probiotics. Take medications as prescribed. Given and reviewed copy of ACP documents from [employer] and encouraged to complete and return. Pap taken

## 2015-10-07 ENCOUNTER — Telehealth: Payer: Self-pay | Admitting: Family Medicine

## 2015-10-07 NOTE — Telephone Encounter (Signed)
Caller name:yvette Relationship to patient:self Can be reached:[phone]  Alt [phone] Pharmacy:[facility] [address]   Reason for call:refill ranatidine 300 mg  Qty 30

## 2015-10-08 MED ORDER — RANITIDINE HCL 300 MG PO TABS: 300.0000 mg | ORAL_TABLET | Freq: Every evening | ORAL | Status: DC | PRN

## 2015-10-08 NOTE — Telephone Encounter (Signed)
Maintenance refill done.

## 2016-01-15 ENCOUNTER — Telehealth: Payer: Self-pay | Admitting: *Deleted

## 2016-01-15 ENCOUNTER — Encounter: Payer: Self-pay | Admitting: *Deleted

## 2016-01-15 NOTE — Telephone Encounter (Signed)
Pre-Visit Call completed with patient and chart updated.   Pre-Visit Info documented in Specialty Comments under [employer].    

## 2016-01-15 NOTE — Addendum Note (Signed)
Addended by: Starla Link on: 01/15/2016 02:35 PM   Modules accepted: Medications

## 2016-01-18 ENCOUNTER — Encounter: Payer: Self-pay | Admitting: Family Medicine

## 2016-01-18 ENCOUNTER — Ambulatory Visit (INDEPENDENT_AMBULATORY_CARE_PROVIDER_SITE_OTHER): Payer: PRIVATE HEALTH INSURANCE | Admitting: Family Medicine

## 2016-01-18 VITALS — BP 128/84 | HR 59 | Temp 98.0°F | Ht 60.0 in | Wt 172.1 lb

## 2016-01-18 DIAGNOSIS — I10 Essential (primary) hypertension: Secondary | ICD-10-CM

## 2016-01-18 DIAGNOSIS — T7840XA Allergy, unspecified, initial encounter: Secondary | ICD-10-CM | POA: Diagnosis not present

## 2016-01-18 DIAGNOSIS — K219 Gastro-esophageal reflux disease without esophagitis: Secondary | ICD-10-CM

## 2016-01-18 DIAGNOSIS — Z0000 Encounter for general adult medical examination without abnormal findings: Secondary | ICD-10-CM | POA: Diagnosis not present

## 2016-01-18 DIAGNOSIS — Z8679 Personal history of other diseases of the circulatory system: Secondary | ICD-10-CM

## 2016-01-18 DIAGNOSIS — Z8774 Personal history of (corrected) congenital malformations of heart and circulatory system: Secondary | ICD-10-CM

## 2016-01-18 DIAGNOSIS — D649 Anemia, unspecified: Secondary | ICD-10-CM

## 2016-01-18 LAB — COMPREHENSIVE METABOLIC PANEL
ALBUMIN: 4.3 g/dL (ref 3.5–5.2)
ALT: 16 U/L (ref 0–35)
AST: 20 U/L (ref 0–37)
Alkaline Phosphatase: 75 U/L (ref 39–117)
BILIRUBIN TOTAL: 0.3 mg/dL (ref 0.2–1.2)
BUN: 12 mg/dL (ref 6–23)
CALCIUM: 9.4 mg/dL (ref 8.4–10.5)
CHLORIDE: 108 meq/L (ref 96–112)
CO2: 24 mEq/L (ref 19–32)
CREATININE: 0.81 mg/dL (ref 0.40–1.20)
GFR: 93.48 mL/min (ref >60.00)
Glucose, Bld: 87 mg/dL (ref 70–99)
Potassium: 4.1 mEq/L (ref 3.5–5.1)
Sodium: 142 mEq/L (ref 135–145)
Total Protein: 7.9 g/dL (ref 6.0–8.3)

## 2016-01-18 LAB — CBC
HEMATOCRIT: 44.2 % (ref 36.0–46.0)
Hemoglobin: 14.1 g/dL (ref 12.0–15.0)
MCHC: 32 g/dL (ref 30.0–36.0)
MCV: 85.2 fl (ref 78.0–100.0)
Platelets: 173 10*3/uL (ref 150.0–400.0)
RBC: 5.18 Mil/uL — ABNORMAL HIGH (ref 3.87–5.11)
RDW: 14.7 % (ref 11.5–15.5)
WBC: 8 10*3/uL (ref 4.0–10.5)

## 2016-01-18 LAB — LIPID PANEL
CHOLESTEROL: 144 mg/dL (ref 0–200)
HDL: 56.9 mg/dL (ref >39.00)
LDL Cholesterol: 77 mg/dL (ref 0–99)
NonHDL: 87.01
TRIGLYCERIDES: 51 mg/dL (ref 0.0–149.0)
Total CHOL/HDL Ratio: 3
VLDL: 10.2 mg/dL (ref 0.0–40.0)

## 2016-01-18 LAB — TSH: TSH: 0.92 u[IU]/mL (ref 0.35–4.50)

## 2016-01-18 NOTE — Assessment & Plan Note (Signed)
Avoid offending foods, start probiotics. Do not eat large meals in late evening and consider raising head of bed. Believes she has developed a lactose intolerance,  With diarrhea occuring after fatty foods. Consider add psyllium or Benefiber. Once to twice daily

## 2016-01-18 NOTE — Progress Notes (Signed)
Pre visit review using our clinic review tool, if applicable. No additional management support is needed unless otherwise documented below in the visit note. 

## 2016-01-18 NOTE — Patient Instructions (Signed)
Fiber once to twice daily and call if worsens  Preventive Care for Adults, Female A healthy lifestyle and preventive care can promote health and wellness. Preventive health guidelines for women include the following key practices.  A routine yearly physical is a good way to check with your health care provider about your health and preventive screening. It is a chance to share any concerns and updates on your health and to receive a thorough exam.  Visit your dentist for a routine exam and preventive care every 6 months. Brush your teeth twice a day and floss once a day. Good oral hygiene prevents tooth decay and gum disease.  The frequency of eye exams is based on your age, health, family medical history, use of contact lenses, and other factors. Follow your health care provider's recommendations for frequency of eye exams.  Eat a healthy diet. Foods like vegetables, fruits, whole grains, low-fat diet  airy products, and lean protein foods contain the nutrients you need without too many calories. Decrease your intake of foods high in solid fats, added sugars, and salt. Eat the right amount of calories for you.Get information about a proper diet from your health care provider, if necessary.  Regular physical exercise is one of the most important things you can do for your health. Most adults should get at least 150 minutes of moderate-intensity exercise (any activity that increases your heart rate and causes you to sweat) each week. In addition, most adults need muscle-strengthening exercises on 2 or more days a week.  Maintain a healthy weight. The body mass index (BMI) is a screening tool to identify possible weight problems. It provides an estimate of body fat based on height and weight. Your health care provider can find your BMI and can help you achieve or maintain a healthy weight.For adults 20 years and older:  A BMI below 18.5 is considered underweight.  A BMI of 18.5 to 24.9 is  normal.  A BMI of 25 to 29.9 is considered overweight.  A BMI of 30 and above is considered obese.  Maintain normal blood lipids and cholesterol levels by exercising and minimizing your intake of saturated fat. Eat a balanced diet with plenty of fruit and vegetables. Blood tests for lipids and cholesterol should begin at age 35 and be repeated every 5 years. If your lipid or cholesterol levels are high, you are over 50, or you are at high risk for heart disease, you may need your cholesterol levels checked more frequently.Ongoing high lipid and cholesterol levels should be treated with medicines if diet and exercise are not working.  If you smoke, find out from your health care provider how to quit. If you do not use tobacco, do not start.  Lung cancer screening is recommended for adults aged 71-80 years who are at high risk for developing lung cancer because of a history of smoking. A yearly low-dose CT scan of the lungs is recommended for people who have at least a 30-pack-year history of smoking and are a current smoker or have quit within the past 15 years. A pack year of smoking is smoking an average of 1 pack of cigarettes a day for 1 year (for example: 1 pack a day for 30 years or 2 packs a day for 15 years). Yearly screening should continue until the smoker has stopped smoking for at least 15 years. Yearly screening should be stopped for people who develop a health problem that would prevent them from having lung cancer treatment.  If you are pregnant, do not drink alcohol. If you are breastfeeding, be very cautious about drinking alcohol. If you are not pregnant and choose to drink alcohol, do not have more than 1 drink per day. One drink is considered to be 12 ounces (355 mL) of beer, 5 ounces (148 mL) of wine, or 1.5 ounces (44 mL) of liquor.  Avoid use of street drugs. Do not share needles with anyone. Ask for help if you need support or instructions about stopping the use of  drugs.  High blood pressure causes heart disease and increases the risk of stroke. Your blood pressure should be checked at least every 1 to 2 years. Ongoing high blood pressure should be treated with medicines if weight loss and exercise do not work.  If you are 66-61 years old, ask your health care provider if you should take aspirin to prevent strokes.  Diabetes screening is done by taking a blood sample to check your blood glucose level after you have not eaten for a certain period of time (fasting). If you are not overweight and you do not have risk factors for diabetes, you should be screened once every 3 years starting at age 32. If you are overweight or obese and you are 35-60 years of age, you should be screened for diabetes every year as part of your cardiovascular risk assessment.  Breast cancer screening is essential preventive care for women. You should practice "breast self-awareness." This means understanding the normal appearance and feel of your breasts and may include breast self-examination. Any changes detected, no matter how small, should be reported to a health care provider. Women in their 63s and 30s should have a clinical breast exam (CBE) by a health care provider as part of a regular health exam every 1 to 3 years. After age 33, women should have a CBE every year. Starting at age 80, women should consider having a mammogram (breast X-ray test) every year. Women who have a family history of breast cancer should talk to their health care provider about genetic screening. Women at a high risk of breast cancer should talk to their health care providers about having an MRI and a mammogram every year.  Breast cancer gene (BRCA)-related cancer risk assessment is recommended for women who have family members with BRCA-related cancers. BRCA-related cancers include breast, ovarian, tubal, and peritoneal cancers. Having family members with these cancers may be associated with an increased  risk for harmful changes (mutations) in the breast cancer genes BRCA1 and BRCA2. Results of the assessment will determine the need for genetic counseling and BRCA1 and BRCA2 testing.  Your health care provider may recommend that you be screened regularly for cancer of the pelvic organs (ovaries, uterus, and vagina). This screening involves a pelvic examination, including checking for microscopic changes to the surface of your cervix (Pap test). You may be encouraged to have this screening done every 3 years, beginning at age 74.  For women ages 87-65, health care providers may recommend pelvic exams and Pap testing every 3 years, or they may recommend the Pap and pelvic exam, combined with testing for human papilloma virus (HPV), every 5 years. Some types of HPV increase your risk of cervical cancer. Testing for HPV may also be done on women of any age with unclear Pap test results.  Other health care providers may not recommend any screening for nonpregnant women who are considered low risk for pelvic cancer and who do not have symptoms. Ask your  health care provider if a screening pelvic exam is right for you.  If you have had past treatment for cervical cancer or a condition that could lead to cancer, you need Pap tests and screening for cancer for at least 20 years after your treatment. If Pap tests have been discontinued, your risk factors (such as having a new sexual partner) need to be reassessed to determine if screening should resume. Some women have medical problems that increase the chance of getting cervical cancer. In these cases, your health care provider may recommend more frequent screening and Pap tests.  Colorectal cancer can be detected and often prevented. Most routine colorectal cancer screening begins at the age of 83 years and continues through age 27 years. However, your health care provider may recommend screening at an earlier age if you have risk factors for colon cancer. On a  yearly basis, your health care provider may provide home test kits to check for hidden blood in the stool. Use of a small camera at the end of a tube, to directly examine the colon (sigmoidoscopy or colonoscopy), can detect the earliest forms of colorectal cancer. Talk to your health care provider about this at age 33, when routine screening begins. Direct exam of the colon should be repeated every 5-10 years through age 91 years, unless early forms of precancerous polyps or small growths are found.  People who are at an increased risk for hepatitis B should be screened for this virus. You are considered at high risk for hepatitis B if:  You were born in a country where hepatitis B occurs often. Talk with your health care provider about which countries are considered high risk.  Your parents were born in a high-risk country and you have not received a shot to protect against hepatitis B (hepatitis B vaccine).  You have HIV or AIDS.  You use needles to inject street drugs.  You live with, or have sex with, someone who has hepatitis B.  You get hemodialysis treatment.  You take certain medicines for conditions like cancer, organ transplantation, and autoimmune conditions.  Hepatitis C blood testing is recommended for all people born from [birth year] through 1965 and any individual with known risks for hepatitis C.  Practice safe sex. Use condoms and avoid high-risk sexual practices to reduce the spread of sexually transmitted infections (STIs). STIs include gonorrhea, chlamydia, syphilis, trichomonas, herpes, HPV, and human immunodeficiency virus (HIV). Herpes, HIV, and HPV are viral illnesses that have no cure. They can result in disability, cancer, and death.  You should be screened for sexually transmitted illnesses (STIs) including gonorrhea and chlamydia if:  You are sexually active and are younger than 24 years.  You are older than 24 years and your health care provider tells you that you are  at risk for this type of infection.  Your sexual activity has changed since you were last screened and you are at an increased risk for chlamydia or gonorrhea. Ask your health care provider if you are at risk.  If you are at risk of being infected with HIV, it is recommended that you take a prescription medicine daily to prevent HIV infection. This is called preexposure prophylaxis (PrEP). You are considered at risk if:  You are sexually active and do not regularly use condoms or know the HIV status of your partner(s).  You take drugs by injection.  You are sexually active with a partner who has HIV.  Talk with your health care provider about whether  you are at high risk of being infected with HIV. If you choose to begin PrEP, you should first be tested for HIV. You should then be tested every 3 months for as long as you are taking PrEP.  Osteoporosis is a disease in which the bones lose minerals and strength with aging. This can result in serious bone fractures or breaks. The risk of osteoporosis can be identified using a bone density scan. Women ages 65 years and over and women at risk for fractures or osteoporosis should discuss screening with their health care providers. Ask your health care provider whether you should take a calcium supplement or vitamin D to reduce the rate of osteoporosis.  Menopause can be associated with physical symptoms and risks. Hormone replacement therapy is available to decrease symptoms and risks. You should talk to your health care provider about whether hormone replacement therapy is right for you.  Use sunscreen. Apply sunscreen liberally and repeatedly throughout the day. You should seek shade when your shadow is shorter than you. Protect yourself by wearing long sleeves, pants, a wide-brimmed hat, and sunglasses year round, whenever you are outdoors.  Once a month, do a whole body skin exam, using a mirror to look at the skin on your back. Tell your health  care provider of new moles, moles that have irregular borders, moles that are larger than a pencil eraser, or moles that have changed in shape or color.  Stay current with required vaccines (immunizations).  Influenza vaccine. All adults should be immunized every year.  Tetanus, diphtheria, and acellular pertussis (Td, Tdap) vaccine. Pregnant women should receive 1 dose of Tdap vaccine during each pregnancy. The dose should be obtained regardless of the length of time since the last dose. Immunization is preferred during the 27th-36th week of gestation. An adult who has not previously received Tdap or who does not know her vaccine status should receive 1 dose of Tdap. This initial dose should be followed by tetanus and diphtheria toxoids (Td) booster doses every 10 years. Adults with an unknown or incomplete history of completing a 3-dose immunization series with Td-containing vaccines should begin or complete a primary immunization series including a Tdap dose. Adults should receive a Td booster every 10 years.  Varicella vaccine. An adult without evidence of immunity to varicella should receive 2 doses or a second dose if she has previously received 1 dose. Pregnant females who do not have evidence of immunity should receive the first dose after pregnancy. This first dose should be obtained before leaving the health care facility. The second dose should be obtained 4-8 weeks after the first dose.  Human papillomavirus (HPV) vaccine. Females aged 13-26 years who have not received the vaccine previously should obtain the 3-dose series. The vaccine is not recommended for use in pregnant females. However, pregnancy testing is not needed before receiving a dose. If a female is found to be pregnant after receiving a dose, no treatment is needed. In that case, the remaining doses should be delayed until after the pregnancy. Immunization is recommended for any person with an immunocompromised condition through  the age of 26 years if she did not get any or all doses earlier. During the 3-dose series, the second dose should be obtained 4-8 weeks after the first dose. The third dose should be obtained 24 weeks after the first dose and 16 weeks after the second dose.  Zoster vaccine. One dose is recommended for adults aged 60 years or older unless certain conditions   are present.  Measles, mumps, and rubella (MMR) vaccine. Adults born before [birth year] generally are considered immune to measles and mumps. Adults born in [birth year] or later should have 1 or more doses of MMR vaccine unless there is a contraindication to the vaccine or there is laboratory evidence of immunity to each of the three diseases. A routine second dose of MMR vaccine should be obtained at least 28 days after the first dose for students attending postsecondary schools, health care workers, or international travelers. People who received inactivated measles vaccine or an unknown type of measles vaccine during 1963-1967 should receive 2 doses of MMR vaccine. People who received inactivated mumps vaccine or an unknown type of mumps vaccine before 1979 and are at high risk for mumps infection should consider immunization with 2 doses of MMR vaccine. For females of childbearing age, rubella immunity should be determined. If there is no evidence of immunity, females who are not pregnant should be vaccinated. If there is no evidence of immunity, females who are pregnant should delay immunization until after pregnancy. Unvaccinated health care workers born before [birth year] who lack laboratory evidence of measles, mumps, or rubella immunity or laboratory confirmation of disease should consider measles and mumps immunization with 2 doses of MMR vaccine or rubella immunization with 1 dose of MMR vaccine.  Pneumococcal 13-valent conjugate (PCV13) vaccine. When indicated, a person who is uncertain of his immunization history and has no record of immunization should receive the  PCV13 vaccine. All adults 46 years of age and older should receive this vaccine. An adult aged 26 years or older who has certain medical conditions and has not been previously immunized should receive 1 dose of PCV13 vaccine. This PCV13 should be followed with a dose of pneumococcal polysaccharide (PPSV23) vaccine. Adults who are at high risk for pneumococcal disease should obtain the PPSV23 vaccine at least 8 weeks after the dose of PCV13 vaccine. Adults older than 58 years of age who have normal immune system function should obtain the PPSV23 vaccine dose at least 1 year after the dose of PCV13 vaccine.  Pneumococcal polysaccharide (PPSV23) vaccine. When PCV13 is also indicated, PCV13 should be obtained first. All adults aged 39 years and older should be immunized. An adult younger than age 63 years who has certain medical conditions should be immunized. Any person who resides in a nursing home or long-term care facility should be immunized. An adult smoker should be immunized. People with an immunocompromised condition and certain other conditions should receive both PCV13 and PPSV23 vaccines. People with human immunodeficiency virus (HIV) infection should be immunized as soon as possible after diagnosis. Immunization during chemotherapy or radiation therapy should be avoided. Routine use of PPSV23 vaccine is not recommended for American Indians, [place] Natives, or people younger than 65 years unless there are medical conditions that require PPSV23 vaccine. When indicated, people who have unknown immunization and have no record of immunization should receive PPSV23 vaccine. One-time revaccination 5 years after the first dose of PPSV23 is recommended for people aged 19-64 years who have chronic kidney failure, nephrotic syndrome, asplenia, or immunocompromised conditions. People who received 1-2 doses of PPSV23 before age 14 years should receive another dose of PPSV23 vaccine at age 30 years or later if at least  5 years have passed since the previous dose. Doses of PPSV23 are not needed for people immunized with PPSV23 at or after age 63 years.  Meningococcal vaccine. Adults with asplenia or persistent complement component deficiencies should receive 2 doses  of quadrivalent meningococcal conjugate (MenACWY-D) vaccine. The doses should be obtained at least 2 months apart. Microbiologists working with certain meningococcal bacteria, military recruits, people at risk during an outbreak, and people who travel to or live in countries with a high rate of meningitis should be immunized. A first-year college student up through age 21 years who is living in a residence hall should receive a dose if she did not receive a dose on or after her 16th birthday. Adults who have certain high-risk conditions should receive one or more doses of vaccine.  Hepatitis A vaccine. Adults who wish to be protected from this disease, have certain high-risk conditions, work with hepatitis A-infected animals, work in hepatitis A research labs, or travel to or work in countries with a high rate of hepatitis A should be immunized. Adults who were previously unvaccinated and who anticipate close contact with an international adoptee during the first 60 days after arrival in the [place] from a country with a high rate of hepatitis A should be immunized.  Hepatitis B vaccine. Adults who wish to be protected from this disease, have certain high-risk conditions, may be exposed to blood or other infectious body fluids, are household contacts or sex partners of hepatitis B positive people, are clients or workers in certain care facilities, or travel to or work in countries with a high rate of hepatitis B should be immunized.  Haemophilus influenzae type b (Hib) vaccine. A previously unvaccinated person with asplenia or sickle cell disease or having a scheduled splenectomy should receive 1 dose of Hib vaccine. Regardless of previous immunization, a  recipient of a hematopoietic stem cell transplant should receive a 3-dose series 6-12 months after her successful transplant. Hib vaccine is not recommended for adults with HIV infection. Preventive Services / Frequency Ages 19 to 39 years  Blood pressure check.** / Every 3-5 years.  Lipid and cholesterol check.** / Every 5 years beginning at age 20.  Clinical breast exam.** / Every 3 years for women in their 20s and 30s.  BRCA-related cancer risk assessment.** / For women who have family members with a BRCA-related cancer (breast, ovarian, tubal, or peritoneal cancers).  Pap test.** / Every 2 years from ages 21 through 29. Every 3 years starting at age 30 through age 65 or 70 with a history of 3 consecutive normal Pap tests.  HPV screening.** / Every 3 years from ages 30 through ages 65 to 70 with a history of 3 consecutive normal Pap tests.  Hepatitis C blood test.** / For any individual with known risks for hepatitis C.  Skin self-exam. / Monthly.  Influenza vaccine. / Every year.  Tetanus, diphtheria, and acellular pertussis (Tdap, Td) vaccine.** / Consult your health care provider. Pregnant women should receive 1 dose of Tdap vaccine during each pregnancy. 1 dose of Td every 10 years.  Varicella vaccine.** / Consult your health care provider. Pregnant females who do not have evidence of immunity should receive the first dose after pregnancy.  HPV vaccine. / 3 doses over 6 months, if 26 and younger. The vaccine is not recommended for use in pregnant females. However, pregnancy testing is not needed before receiving a dose.  Measles, mumps, rubella (MMR) vaccine.** / You need at least 1 dose of MMR if you were born in 1957 or later. You may also need a 2nd dose. For females of childbearing age, rubella immunity should be determined. If there is no evidence of immunity, females who are not pregnant should be   vaccinated. If there is no evidence of immunity, females who are pregnant  should delay immunization until after pregnancy.  Pneumococcal 13-valent conjugate (PCV13) vaccine.** / Consult your health care provider.  Pneumococcal polysaccharide (PPSV23) vaccine.** / 1 to 2 doses if you smoke cigarettes or if you have certain conditions.  Meningococcal vaccine.** / 1 dose if you are age 19 to 21 years and a first-year college student living in a residence hall, or have one of several medical conditions, you need to get vaccinated against meningococcal disease. You may also need additional booster doses.  Hepatitis A vaccine.** / Consult your health care provider.  Hepatitis B vaccine.** / Consult your health care provider.  Haemophilus influenzae type b (Hib) vaccine.** / Consult your health care provider. Ages 40 to 64 years  Blood pressure check.** / Every year.  Lipid and cholesterol check.** / Every 5 years beginning at age 20 years.  Lung cancer screening. / Every year if you are aged 55-80 years and have a 30-pack-year history of smoking and currently smoke or have quit within the past 15 years. Yearly screening is stopped once you have quit smoking for at least 15 years or develop a health problem that would prevent you from having lung cancer treatment.  Clinical breast exam.** / Every year after age 40 years.  BRCA-related cancer risk assessment.** / For women who have family members with a BRCA-related cancer (breast, ovarian, tubal, or peritoneal cancers).  Mammogram.** / Every year beginning at age 40 years and continuing for as long as you are in good health. Consult with your health care provider.  Pap test.** / Every 3 years starting at age 30 years through age 65 or 70 years with a history of 3 consecutive normal Pap tests.  HPV screening.** / Every 3 years from ages 30 years through ages 65 to 70 years with a history of 3 consecutive normal Pap tests.  Fecal occult blood test (FOBT) of stool. / Every year beginning at age 50 years and  continuing until age 75 years. You may not need to do this test if you get a colonoscopy every 10 years.  Flexible sigmoidoscopy or colonoscopy.** / Every 5 years for a flexible sigmoidoscopy or every 10 years for a colonoscopy beginning at age 50 years and continuing until age 75 years.  Hepatitis C blood test.** / For all people born from 1945 through 1965 and any individual with known risks for hepatitis C.  Skin self-exam. / Monthly.  Influenza vaccine. / Every year.  Tetanus, diphtheria, and acellular pertussis (Tdap/Td) vaccine.** / Consult your health care provider. Pregnant women should receive 1 dose of Tdap vaccine during each pregnancy. 1 dose of Td every 10 years.  Varicella vaccine.** / Consult your health care provider. Pregnant females who do not have evidence of immunity should receive the first dose after pregnancy.  Zoster vaccine.** / 1 dose for adults aged 60 years or older.  Measles, mumps, rubella (MMR) vaccine.** / You need at least 1 dose of MMR if you were born in 1957 or later. You may also need a second dose. For females of childbearing age, rubella immunity should be determined. If there is no evidence of immunity, females who are not pregnant should be vaccinated. If there is no evidence of immunity, females who are pregnant should delay immunization until after pregnancy.  Pneumococcal 13-valent conjugate (PCV13) vaccine.** / Consult your health care provider.  Pneumococcal polysaccharide (PPSV23) vaccine.** / 1 to 2 doses if you smoke   cigarettes or if you have certain conditions.  Meningococcal vaccine.** / Consult your health care provider.  Hepatitis A vaccine.** / Consult your health care provider.  Hepatitis B vaccine.** / Consult your health care provider.  Haemophilus influenzae type b (Hib) vaccine.** / Consult your health care provider. Ages 68 years and over  Blood pressure check.** / Every year.  Lipid and cholesterol check.** / Every 5 years  beginning at age 77 years.  Lung cancer screening. / Every year if you are aged 23-80 years and have a 30-pack-year history of smoking and currently smoke or have quit within the past 15 years. Yearly screening is stopped once you have quit smoking for at least 15 years or develop a health problem that would prevent you from having lung cancer treatment.  Clinical breast exam.** / Every year after age 55 years.  BRCA-related cancer risk assessment.** / For women who have family members with a BRCA-related cancer (breast, ovarian, tubal, or peritoneal cancers).  Mammogram.** / Every year beginning at age 45 years and continuing for as long as you are in good health. Consult with your health care provider.  Pap test.** / Every 3 years starting at age 19 years through age 47 or 67 years with 3 consecutive normal Pap tests. Testing can be stopped between 65 and 70 years with 3 consecutive normal Pap tests and no abnormal Pap or HPV tests in the past 10 years.  HPV screening.** / Every 3 years from ages 53 years through ages 49 or 43 years with a history of 3 consecutive normal Pap tests. Testing can be stopped between 65 and 70 years with 3 consecutive normal Pap tests and no abnormal Pap or HPV tests in the past 10 years.  Fecal occult blood test (FOBT) of stool. / Every year beginning at age 71 years and continuing until age 59 years. You may not need to do this test if you get a colonoscopy every 10 years.  Flexible sigmoidoscopy or colonoscopy.** / Every 5 years for a flexible sigmoidoscopy or every 10 years for a colonoscopy beginning at age 52 years and continuing until age 87 years.  Hepatitis C blood test.** / For all people born from [birth year] through 1965 and any individual with known risks for hepatitis C.  Osteoporosis screening.** / A one-time screening for women ages 18 years and over and women at risk for fractures or osteoporosis.  Skin self-exam. / Monthly.  Influenza vaccine. /  Every year.  Tetanus, diphtheria, and acellular pertussis (Tdap/Td) vaccine.** / 1 dose of Td every 10 years.  Varicella vaccine.** / Consult your health care provider.  Zoster vaccine.** / 1 dose for adults aged 63 years or older.  Pneumococcal 13-valent conjugate (PCV13) vaccine.** / Consult your health care provider.  Pneumococcal polysaccharide (PPSV23) vaccine.** / 1 dose for all adults aged 4 years and older.  Meningococcal vaccine.** / Consult your health care provider.  Hepatitis A vaccine.** / Consult your health care provider.  Hepatitis B vaccine.** / Consult your health care provider.  Haemophilus influenzae type b (Hib) vaccine.** / Consult your health care provider. ** Family history and personal history of risk and conditions may change your health care provider's recommendations.   This information is not intended to replace advice given to you by your health care provider. Make sure you discuss any questions you have with your health care provider.   Document Released: 01/17/2002 Document Revised: 12/12/2014 Document Reviewed: 04/18/2011 Elsevier Interactive Patient Education [employer].

## 2016-01-18 NOTE — Assessment & Plan Note (Signed)
Well controlled, no changes to meds. Encouraged heart healthy diet such as the DASH diet and exercise as tolerated.  °

## 2016-01-18 NOTE — Assessment & Plan Note (Signed)
Taking Loratadine, Mucinex and Flonase prn. Nasal saline. Continue the same

## 2016-01-24 NOTE — Progress Notes (Signed)
Patient ID: Jenna Johnson, female   DOB: Aug 20, 1956, 60 y.o.   MRN: [id number]   Subjective:    Patient ID: Jenna Johnson, female    DOB: [birth year], 60 y.o.   MRN: [id number]  Chief Complaint  Patient presents with  . Annual Exam    HPI Patient is in today for annual exam. Is feeling fairly well. Did have sinus and bronchitis symptoms back in December with some low-grade fever cough and headache but those symptoms are largely resolved at this time. Has only some mild nasal congestion at times presently. Reports no other acute complaints otherwise. Denies CP/palp/SOB/HA/congestion/fevers/GI or GU c/o. Taking meds as prescribed  Past Medical History  Diagnosis Date  . Chicken pox as a child  . Measles as a child  . Allergy   . Anemia   . Hypertension   . UTI (urinary tract infection)   . GERD (gastroesophageal reflux disease)   . TIA (transient ischemic attack) 2004  . Lactose intolerance   . H/O atrial septal defect   . Endometriosis   . Allergic state   . Hip pain, bilateral 09/18/2013  . Myalgia and myositis 09/18/2013  . Dermatitis 09/18/2013  . TIA (transient ischemic attack) 09/18/2013  . Preventative health care 01/07/2014    Past Surgical History  Procedure Laterality Date  . Appendectomy  60 yrs old  . Tia  surgery 2005  . Hole in heart    . Asd repair  2006  . Abdominal hysterectomy  60 yrs old    still has one ovary  . Right oophorectomy      ruptured ovarian cyst at age 62    Family History  Problem Relation Age of Onset  . Multiple sclerosis Mother   . Other Mother     blue tumor, air embolism,  . Diabetes Father 17    type 2  . Other Father     fatty liver, lactose intolerant  . Cancer Paternal Grandmother 53    breast  . Diabetes Paternal Grandmother   . Other Sister     s/p hysterectomy  . Other Sister     s/p hysterectomy    Social History   Social History  . Marital Status: Married    Spouse Name: N/A  . Number of  Children: N/A  . Years of Education: N/A   Occupational History  . Not on file.   Social History Main Topics  . Smoking status: Never Smoker   . Smokeless tobacco: Never Used  . Alcohol Use: Yes     Comment: occasionally  . Drug Use: No  . Sexual Activity:    Partners: Male   Other Topics Concern  . Not on file   Social History Narrative    Outpatient Prescriptions Prior to Visit  Medication Sig Dispense Refill  . aspirin EC 81 MG tablet Take 1 tablet (81 mg total) by mouth daily.    . cetirizine (ZYRTEC) 10 MG tablet Take 20 mg by mouth daily as needed.     Marland Kitchen guaifenesin (HUMIBID E) 400 MG TABS tablet Take 400 mg by mouth as needed.     . loratadine (CLARITIN) 10 MG tablet Take 10 mg by mouth daily as needed.     . Oxymetazoline HCl (ANEFRIN SPRAY NA) Place into the nose as needed.    . Probiotic Product (PROBIOTIC PO) Take by mouth daily.    . ranitidine (ZANTAC) 300 MG tablet Take 1 tablet (300 mg total) by mouth at  bedtime as needed for heartburn. 30 tablet 6   No facility-administered medications prior to visit.    No Known Allergies  Review of Systems  Constitutional: Negative for fever, chills and malaise/fatigue.  HENT: Positive for congestion. Negative for hearing loss.   Eyes: Negative for discharge.  Respiratory: Negative for cough, sputum production and shortness of breath.   Cardiovascular: Negative for chest pain, palpitations and leg swelling.  Gastrointestinal: Negative for heartburn, nausea, vomiting, abdominal pain, diarrhea, constipation and blood in stool.  Genitourinary: Negative for dysuria, urgency, frequency and hematuria.  Musculoskeletal: Negative for myalgias, back pain and falls.  Skin: Negative for rash.  Neurological: Negative for dizziness, sensory change, loss of consciousness, weakness and headaches.  Endo/Heme/Allergies: Negative for environmental allergies. Does not bruise/bleed easily.  Psychiatric/Behavioral: Negative for depression  and suicidal ideas. The patient is not nervous/anxious and does not have insomnia.        Objective:    Physical Exam  Constitutional: She is oriented to person, place, and time. She appears well-developed and well-nourished. No distress.  HENT:  Head: Normocephalic and atraumatic.  Eyes: Conjunctivae are normal.  Neck: Neck supple. No thyromegaly present.  Cardiovascular: Normal rate, regular rhythm and normal heart sounds.   No murmur heard. Pulmonary/Chest: Effort normal and breath sounds normal. No respiratory distress.  Abdominal: Soft. Bowel sounds are normal. She exhibits no distension and no mass. There is no tenderness.  Musculoskeletal: She exhibits no edema.  Lymphadenopathy:    She has no cervical adenopathy.  Neurological: She is alert and oriented to person, place, and time.  Skin: Skin is warm and dry.  Psychiatric: She has a normal mood and affect. Her behavior is normal.    BP 128/84 mmHg  Pulse 59  Temp(Src) 98 F (36.7 C) (Oral)  Ht 5' (1.524 m)  Wt 172 lb 2 oz (78.075 kg)  BMI 33.62 kg/m2  SpO2 98% Wt Readings from Last 3 Encounters:  01/18/16 172 lb 2 oz (78.075 kg)  07/13/15 173 lb 6 oz (78.642 kg)  02/16/15 170 lb (77.111 kg)     Lab Results  Component Value Date   WBC 8.0 01/18/2016   HGB 14.1 01/18/2016   HCT 44.2 01/18/2016   PLT 173.0 01/18/2016   GLUCOSE 87 01/18/2016   CHOL 144 01/18/2016   TRIG 51.0 01/18/2016   HDL 56.90 01/18/2016   LDLCALC 77 01/18/2016   ALT 16 01/18/2016   AST 20 01/18/2016   NA 142 01/18/2016   K 4.1 01/18/2016   CL 108 01/18/2016   CREATININE 0.81 01/18/2016   BUN 12 01/18/2016   CO2 24 01/18/2016   TSH 0.92 01/18/2016    Lab Results  Component Value Date   TSH 0.92 01/18/2016   Lab Results  Component Value Date   WBC 8.0 01/18/2016   HGB 14.1 01/18/2016   HCT 44.2 01/18/2016   MCV 85.2 01/18/2016   PLT 173.0 01/18/2016   Lab Results  Component Value Date   NA 142 01/18/2016   K 4.1  01/18/2016   CO2 24 01/18/2016   GLUCOSE 87 01/18/2016   BUN 12 01/18/2016   CREATININE 0.81 01/18/2016   BILITOT 0.3 01/18/2016   ALKPHOS 75 01/18/2016   AST 20 01/18/2016   ALT 16 01/18/2016   PROT 7.9 01/18/2016   ALBUMIN 4.3 01/18/2016   CALCIUM 9.4 01/18/2016   GFR 93.48 01/18/2016   Lab Results  Component Value Date   CHOL 144 01/18/2016   Lab Results  Component  Value Date   HDL 56.90 01/18/2016   Lab Results  Component Value Date   LDLCALC 77 01/18/2016   Lab Results  Component Value Date   TRIG 51.0 01/18/2016   Lab Results  Component Value Date   CHOLHDL 3 01/18/2016   No results found for: HGBA1C     Assessment & Plan:   Problem List Items Addressed This Visit    Allergic state - Primary    Taking Loratadine, Mucinex and Flonase prn. Nasal saline. Continue the same      Relevant Orders   CBC (Completed)   TSH (Completed)   Comprehensive metabolic panel (Completed)   Lipid panel (Completed)   Anemia   Relevant Orders   CBC (Completed)   TSH (Completed)   Comprehensive metabolic panel (Completed)   Lipid panel (Completed)   GERD (gastroesophageal reflux disease)    Avoid offending foods, start probiotics. Do not eat large meals in late evening and consider raising head of bed. Believes she has developed a lactose intolerance,  With diarrhea occuring after fatty foods. Consider add psyllium or Benefiber. Once to twice daily      Relevant Orders   CBC (Completed)   TSH (Completed)   Comprehensive metabolic panel (Completed)   Lipid panel (Completed)   H/O atrial septal defect   Hypertension    Well controlled, no changes to meds. Encouraged heart healthy diet such as the DASH diet and exercise as tolerated.       Relevant Orders   CBC (Completed)   TSH (Completed)   Comprehensive metabolic panel (Completed)   Lipid panel (Completed)   Preventative health care    Patient encouraged to maintain heart healthy diet, regular exercise,  adequate sleep. Consider daily probiotics. Take medications as prescribed. Labs reviewed.      Relevant Orders   CBC (Completed)   TSH (Completed)   Comprehensive metabolic panel (Completed)   Lipid panel (Completed)      I am having Jenna Johnson maintain her cetirizine, aspirin EC, loratadine, guaifenesin, ranitidine, Probiotic Product (PROBIOTIC PO), and Oxymetazoline HCl (ANEFRIN SPRAY NA).  No orders of the defined types were placed in this encounter.     Danise Edge, MD

## 2016-01-24 NOTE — Assessment & Plan Note (Signed)
Patient encouraged to maintain heart healthy diet, regular exercise, adequate sleep. Consider daily probiotics. Take medications as prescribed. Labs reviewed 

## 2016-03-08 ENCOUNTER — Other Ambulatory Visit: Payer: Self-pay

## 2016-03-08 DIAGNOSIS — Z1231 Encounter for screening mammogram for malignant neoplasm of breast: Secondary | ICD-10-CM

## 2016-03-16 ENCOUNTER — Other Ambulatory Visit: Payer: Self-pay | Admitting: *Deleted

## 2016-03-16 MED ORDER — RANITIDINE HCL 300 MG PO TABS: 300.0000 mg | ORAL_TABLET | Freq: Every evening | ORAL | Status: DC | PRN

## 2016-03-16 NOTE — Telephone Encounter (Signed)
Rx sent to the pharmacy by e-script.//AB/CMA 

## 2016-03-30 ENCOUNTER — Ambulatory Visit
Admission: RE | Admit: 2016-03-30 | Discharge: 2016-03-30 | Disposition: A | Discharge status: Home / Self Care | Payer: PRIVATE HEALTH INSURANCE | Source: Ambulatory Visit

## 2016-03-30 DIAGNOSIS — Z1231 Encounter for screening mammogram for malignant neoplasm of breast: Secondary | ICD-10-CM

## 2016-07-11 ENCOUNTER — Other Ambulatory Visit: Payer: PRIVATE HEALTH INSURANCE

## 2016-07-18 ENCOUNTER — Encounter: Payer: PRIVATE HEALTH INSURANCE | Admitting: Family Medicine

## 2016-11-15 NOTE — Progress Notes (Signed)
Tawana ScaleZach Giann Obara D.O. [facility] [address] [place], [place] [address] Phone: [phone] Subjective:    I'm seeing this patient by the request  of:  Danise EdgeBLYTH, STACEY, MD   CC: Right knee pain  VHQ:[id number]:Subjective  Jenna Johnson is a 60 y.o. female coming in with complaint of right knee pain. Patient sates that this is severe. Worse over the last several weeks. Patient did fall on an approximate month ago. Patient states since then has had pain on the anterior aspect of the knee. Certain activities such as going up or down stairs is severe. Some mild instability. Has been biking and states that walking seems to make it worse. Over-the-counter medications are not helping. Patient has not fallen again since the initial injury. Rates the severity of pain a 6 out of 10.     Past Medical History:  Diagnosis Date  . Allergic state   . Allergy   . Anemia   . Chicken pox as a child  . Dermatitis 09/18/2013  . Endometriosis   . GERD (gastroesophageal reflux disease)   . H/O atrial septal defect   . Hip pain, bilateral 09/18/2013  . Hypertension   . Lactose intolerance   . Measles as a child  . Myalgia and myositis 09/18/2013  . Preventative health care 01/07/2014  . TIA (transient ischemic attack) 2004  . TIA (transient ischemic attack) 09/18/2013  . UTI (urinary tract infection)    Past Surgical History:  Procedure Laterality Date  . ABDOMINAL HYSTERECTOMY  60 yrs old   still has one ovary  . APPENDECTOMY  60 yrs old  . ASD REPAIR  2006  . hole in heart    . RIGHT OOPHORECTOMY     ruptured ovarian cyst at age 60  . tia  surgery 2005   Social History   Social History  . Marital status: Married    Spouse name: N/A  . Number of children: N/A  . Years of education: N/A   Social History Main Topics  . Smoking status: Never Smoker  . Smokeless tobacco: Never Used  . Alcohol use Yes     Comment: occasionally  . Drug use: No  . Sexual activity: Yes    Partners:  Male   Other Topics Concern  . None   Social History Narrative  . None   No Known Allergies Family History  Problem Relation Age of Onset  . Multiple sclerosis Mother   . Other Mother     blue tumor, air embolism,  . Diabetes Father [id number]    type 2  . Other Father     fatty liver, lactose intolerant  . Cancer Paternal Grandmother [id number]    breast  . Diabetes Paternal Grandmother   . Other Sister     s/p hysterectomy  . Other Sister     s/p hysterectomy    Past medical history, social, surgical and family history all reviewed in electronic medical record.  No pertanent information unless stated regarding to the chief complaint.   Review of Systems:Review of systems updated and as accurate as of 11/16/16  No headache, visual changes, nausea, vomiting, diarrhea, constipation, dizziness, abdominal pain, skin rash, fevers, chills, night sweats, weight loss, swollen lymph nodes, , chest pain, shortness of breath, mood changes.   Objective  Blood pressure 128/80, pulse 74, height 5' (1.524 m), weight 173 lb (78.5 kg), SpO2 99 %. Systems examined below as of 11/16/16   General: No apparent distress alert and  oriented x3 mood and affect normal, dressed appropriately. Mildly obese HEENT: Pupils equal, extraocular movements intact  Respiratory: Patient's speak in full sentences and does not appear short of breath  Cardiovascular: No lower extremity edema, non tender, no erythema  Skin: Warm dry intact with no signs of infection or rash on extremities or on axial skeleton.  Abdomen: Soft nontender  Neuro: Cranial nerves II through XII are intact, neurovascularly intact in all extremities with 2+ DTRs and 2+ pulses.  Lymph: No lymphadenopathy of posterior or anterior cervical chain or axillae bilaterally.  Gait normal with good balance and coordination.  MSK:  Non tender with full range of motion and good stability and symmetric strength and tone of shoulders, elbows, wrist, hip, and ankles  bilaterally. Mild arthritic changes of multiple joints. Knee: Right Difficult to assess secondary to patient's body habitus mild lateral tilt of the patella noted Tender to palpation over the lateral aspect of the patella ROM full in flexion and extension and lower leg rotation. Mild instability with valgus force Negative Mcmurray's, Apley's, and Thessalonian tests. Severe painful patellar compression. Patellar glide with moderate crepitus. Patellar and quadriceps tendons unremarkable. Hamstring and quadriceps strength is normal.  Severe pes planus of the feet bilaterally with overpronation.  MSK US performed of: Right This study was ordered, performed, and interpreted by Terrilee FilesZach Sandip Power D.O.  Knee: Severe narrowing of the patellofemoral joint with significant loose bodies noted. Mild hypoechoic changes and effusion noted. Patient's medial joint line has mild narrowing.  IMPRESSION:  Severe patellofemoral arthritis  After informed written and verbal consent, patient was seated on exam table. Right knee was prepped with alcohol swab and utilizing anterolateral approach, patient's right knee space was injected with 4:1  marcaine 0.5%: Kenalog 40mg /dL. Patient tolerated the procedure well without immediate complications.  Procedure note 97110; 15 minutes spent for Therapeutic exercises as stated in above notes.  This included exercises focusing on stretching, strengthening, with significant focus on eccentric aspects. Flexion and extension exercises focusing on the vastus medialis oblique, hip abductor strengthening.  Proper technique shown and discussed handout in great detail with ATC.  All questions were discussed and answered.      Impression and Recommendations:     This case required medical decision making of moderate complexity.      Note: This dictation was prepared with Dragon dictation along with smaller phrase technology. Any transcriptional errors that result from this  process are unintentional.

## 2016-11-16 ENCOUNTER — Encounter: Payer: Self-pay | Admitting: Family Medicine

## 2016-11-16 ENCOUNTER — Ambulatory Visit: Payer: Self-pay

## 2016-11-16 ENCOUNTER — Ambulatory Visit (INDEPENDENT_AMBULATORY_CARE_PROVIDER_SITE_OTHER): Payer: PRIVATE HEALTH INSURANCE | Admitting: Family Medicine

## 2016-11-16 VITALS — BP 128/80 | HR 74 | Ht 60.0 in | Wt 173.0 lb

## 2016-11-16 DIAGNOSIS — M1711 Unilateral primary osteoarthritis, right knee: Secondary | ICD-10-CM

## 2016-11-16 DIAGNOSIS — M25561 Pain in right knee: Secondary | ICD-10-CM

## 2016-11-16 NOTE — Assessment & Plan Note (Signed)
Patient given an injection today and tolerated the procedure well. We discussed icing regimen and home exercises. We discussed objective recently do a which was to avoid. Patient will come back and see me again in 4-6 weeks for further evaluation and treatment. At that time worsening symptoms she could be a candidate for viscous supple mentation. Patient will do bracing as well. Work with home exercises with Event organiserathletic trainer today.

## 2016-11-16 NOTE — Patient Instructions (Signed)
Good to see you.  Ice 20 minutes 2 times daily. Usually after activity and before bed. pennsaid pinkie amount topically 2 times daily as needed.  Biking and swimming mostly.  Avoid significant walking at least the next 2 weeks  Try to wear the brace with a lot of activity  We will fix the orthotics Exercises 3 times a week.  See me again in 4 weeks Happy holidays!

## 2017-01-11 ENCOUNTER — Encounter: Payer: Self-pay | Admitting: Family Medicine

## 2017-01-11 ENCOUNTER — Ambulatory Visit (INDEPENDENT_AMBULATORY_CARE_PROVIDER_SITE_OTHER): Payer: PRIVATE HEALTH INSURANCE | Admitting: Family Medicine

## 2017-01-11 DIAGNOSIS — M1711 Unilateral primary osteoarthritis, right knee: Secondary | ICD-10-CM | POA: Diagnosis not present

## 2017-01-11 NOTE — Progress Notes (Signed)
[name] [address] [place], [place] [address] Phone: [phone] Subjective:    I'm seeing this patient by the request  of:  Danise Edge, MD   CC: Right knee pain  [id number]  Jenna Johnson is a 61 y.o. female coming in with complaint of right knee pain. Found to have PFS.  Was to do home exercises and given injection.  Was doing better. Still better after injection but starting to get swelling.       Past Medical History:  Diagnosis Date  . Allergic state   . Allergy   . Anemia   . Chicken pox as a child  . Dermatitis 09/18/2013  . Endometriosis   . GERD (gastroesophageal reflux disease)   . H/O atrial septal defect   . Hip pain, bilateral 09/18/2013  . Hypertension   . Lactose intolerance   . Measles as a child  . Myalgia and myositis 09/18/2013  . Preventative health care 01/07/2014  . TIA (transient ischemic attack) 2004  . TIA (transient ischemic attack) 09/18/2013  . UTI (urinary tract infection)    Past Surgical History:  Procedure Laterality Date  . ABDOMINAL HYSTERECTOMY  61 yrs old   still has one ovary  . APPENDECTOMY  61 yrs old  . ASD REPAIR  2006  . hole in heart    . RIGHT OOPHORECTOMY     ruptured ovarian cyst at age 70  . tia  surgery 2005   Social History   Social History  . Marital status: Married    Spouse name: N/A  . Number of children: N/A  . Years of education: N/A   Social History Main Topics  . Smoking status: Never Smoker  . Smokeless tobacco: Never Used  . Alcohol use Yes     Comment: occasionally  . Drug use: No  . Sexual activity: Yes    Partners: Male   Other Topics Concern  . None   Social History Narrative  . None   No Known Allergies Family History  Problem Relation Age of Onset  . Multiple sclerosis Mother   . Other Mother     blue tumor, air embolism,  . Diabetes Father 50    type 2  . Other Father     fatty liver, lactose intolerant  . Cancer Paternal  Grandmother 41    breast  . Diabetes Paternal Grandmother   . Other Sister     s/p hysterectomy  . Other Sister     s/p hysterectomy    Past medical history, social, surgical and family history all reviewed in electronic medical record.  No pertanent information unless stated regarding to the chief complaint.   Review of Systems: No headache, visual changes, nausea, vomiting, diarrhea, constipation, dizziness, abdominal pain, skin rash, fevers, chills, night sweats, weight loss, swollen lymph nodes, body aches, joint swelling, muscle aches, chest pain, shortness of breath, mood changes.    Objective  Blood pressure 128/72, pulse 72, height 5' (1.524 m), weight 175 lb (79.4 kg), SpO2 99 %.  Systems examined below as of 01/11/17 General: NAD A&O x3 mood, affect normal  HEENT: Pupils equal, extraocular movements intact no nystagmus Respiratory: not short of breath at rest or with speaking Cardiovascular: No lower extremity edema, non tender Skin: Warm dry intact with no signs of infection or rash on extremities or on axial skeleton. Abdomen: Soft nontender, no masses Neuro: Cranial nerves  intact, neurovascularly intact in all extremities with 2+  DTRs and 2+ pulses. Lymph: No lymphadenopathy appreciated today  Gait mild antalgic gait.  MSK:  Non tender with full range of motion and good stability and symmetric strength and tone of shoulders, elbows, wrist, hip, and ankles bilaterally. Mild arthritic changes of multiple joints. Knee:right  valgus deformity noted. Large thigh to calf ratio. Trace effusion noted mild improvement.  Tender to palpation over medial and PF joint line.  ROM full in flexion and extension and lower leg rotation. instability with valgus force.  painful patellar compression. Patellar glide with moderate crepitus. Stable Patellar and quadriceps tendons unremarkable. Hamstring and quadriceps strength is normal. Contralateral knee shows mild crepitus.        Impression and Recommendations:     This case required medical decision making of moderate complexity.      Note: This dictation was prepared with Dragon dictation along with smaller phrase technology. Any transcriptional errors that result from this process are unintentional.

## 2017-01-11 NOTE — Assessment & Plan Note (Signed)
Patient seems stable at this time. Patient is doing relatively well. Has been 2 months since injection. We discussed the possibility of a repeat injection which patient declined. Patient also declined any type of viscous supple mentation. We discussed icing regimen. Patient will come back and see me again after her trip.

## 2017-01-11 NOTE — Patient Instructions (Signed)
Good to see you You know the drill  Would try to find ice at the end of the day  pennsaid pinkie amount topically 2 times daily as needed.   I hope the orthotics help Have a great trip  See me again 1-2 weeks after your trip if increasing pain.

## 2017-01-19 ENCOUNTER — Ambulatory Visit (INDEPENDENT_AMBULATORY_CARE_PROVIDER_SITE_OTHER): Payer: PRIVATE HEALTH INSURANCE | Admitting: Family Medicine

## 2017-01-19 VITALS — BP 124/78 | HR 73 | Temp 98.0°F

## 2017-01-19 DIAGNOSIS — I10 Essential (primary) hypertension: Secondary | ICD-10-CM

## 2017-01-19 DIAGNOSIS — Z0000 Encounter for general adult medical examination without abnormal findings: Secondary | ICD-10-CM

## 2017-01-19 DIAGNOSIS — K219 Gastro-esophageal reflux disease without esophagitis: Secondary | ICD-10-CM

## 2017-01-19 DIAGNOSIS — R252 Cramp and spasm: Secondary | ICD-10-CM

## 2017-01-19 DIAGNOSIS — Z8774 Personal history of (corrected) congenital malformations of heart and circulatory system: Secondary | ICD-10-CM

## 2017-01-19 DIAGNOSIS — Z8679 Personal history of other diseases of the circulatory system: Secondary | ICD-10-CM

## 2017-01-19 DIAGNOSIS — M1711 Unilateral primary osteoarthritis, right knee: Secondary | ICD-10-CM | POA: Diagnosis not present

## 2017-01-19 DIAGNOSIS — G459 Transient cerebral ischemic attack, unspecified: Secondary | ICD-10-CM | POA: Diagnosis not present

## 2017-01-19 LAB — TSH: TSH: 0.83 u[IU]/mL (ref 0.35–4.50)

## 2017-01-19 LAB — CBC
HCT: 42.6 % (ref 36.0–46.0)
Hemoglobin: 13.9 g/dL (ref 12.0–15.0)
MCHC: 32.7 g/dL (ref 30.0–36.0)
MCV: 85.3 fl (ref 78.0–100.0)
Platelets: 161 10*3/uL (ref 150.0–400.0)
RBC: 5 Mil/uL (ref 3.87–5.11)
RDW: 14.5 % (ref 11.5–15.5)
WBC: 8.3 10*3/uL (ref 4.0–10.5)

## 2017-01-19 LAB — MAGNESIUM: MAGNESIUM: 2.1 mg/dL (ref 1.5–2.5)

## 2017-01-19 LAB — COMPREHENSIVE METABOLIC PANEL
ALBUMIN: 4.5 g/dL (ref 3.5–5.2)
ALK PHOS: 68 U/L (ref 39–117)
ALT: 17 U/L (ref 0–35)
AST: 17 U/L (ref 0–37)
BILIRUBIN TOTAL: 0.3 mg/dL (ref 0.2–1.2)
BUN: 12 mg/dL (ref 6–23)
CO2: 29 mEq/L (ref 19–32)
CREATININE: 0.85 mg/dL (ref 0.40–1.20)
Calcium: 9.6 mg/dL (ref 8.4–10.5)
Chloride: 105 mEq/L (ref 96–112)
GFR: 88.12 mL/min (ref >60.00)
GLUCOSE: 88 mg/dL (ref 70–99)
POTASSIUM: 4.1 meq/L (ref 3.5–5.1)
SODIUM: 140 meq/L (ref 135–145)
TOTAL PROTEIN: 7.9 g/dL (ref 6.0–8.3)

## 2017-01-19 NOTE — Progress Notes (Signed)
Pre visit review using our clinic review tool, if applicable. No additional management support is needed unless otherwise documented below in the visit note. 

## 2017-01-19 NOTE — Progress Notes (Signed)
Patient ID: Jenna Johnson, female   DOB: March 11, 1956, 61 y.o.   MRN: [id number]   Subjective:    Patient ID: Jenna CulverGeorge-Edna Y Munar, female    DOB: March 11, 1956, 61 y.o.   MRN: [id number]  Chief Complaint  Patient presents with  . Annual Exam   I acted as a Neurosurgeonscribe for Dr. Abner GreenspanBlyth. Wolfe Camarena, RMA  HPI  Patient is in today for an annual exam following up on hypertension, and other medical concerns. Patient complains of right knee pain, she has been wearing a brace and following up with sports med. No recent fall or trauma. She has noted occasional cramps in b/l legs mostly at night. No recent febrile illness or hospitalizations. She maintains a heart healthy diet and stays active. Complains of sinus congestion. No cough or malaise. No sputum production. Denies CP/palp/SOB/HA/fevers/GI or GU c/o. Taking meds as prescribed  Past Medical History:  Diagnosis Date  . Allergic state   . Allergy   . Anemia   . Chicken pox as a child  . Dermatitis 09/18/2013  . Endometriosis   . GERD (gastroesophageal reflux disease)   . H/O atrial septal defect   . Hip pain, bilateral 09/18/2013  . Hypertension   . Lactose intolerance   . Measles as a child  . Myalgia and myositis 09/18/2013  . Preventative health care 01/07/2014  . TIA (transient ischemic attack) 2004  . TIA (transient ischemic attack) 09/18/2013  . UTI (urinary tract infection)     Past Surgical History:  Procedure Laterality Date  . ABDOMINAL HYSTERECTOMY  61 yrs old   still has one ovary  . APPENDECTOMY  61 yrs old  . ASD REPAIR  2006  . hole in heart    . RIGHT OOPHORECTOMY     ruptured ovarian cyst at age 61  . tia  surgery 2005    Family History  Problem Relation Age of Onset  . Multiple sclerosis Mother   . Other Mother     blue tumor, air embolism,  . Diabetes Father [id number]    type 2  . Other Father     fatty liver, lactose intolerant  . Cancer Paternal Grandmother [id number]    breast  . Diabetes Paternal Grandmother   .  Other Sister     s/p hysterectomy  . Other Sister     s/p hysterectomy    Social History   Social History  . Marital status: Married    Spouse name: N/A  . Number of children: N/A  . Years of education: N/A   Occupational History  . Not on file.   Social History Main Topics  . Smoking status: Never Smoker  . Smokeless tobacco: Never Used  . Alcohol use Yes     Comment: occasionally  . Drug use: No  . Sexual activity: Yes    Partners: Male   Other Topics Concern  . Not on file   Social History Narrative  . No narrative on file    Outpatient Medications Prior to Visit  Medication Sig Dispense Refill  . aspirin EC 81 MG tablet Take 1 tablet (81 mg total) by mouth daily.    Marland Kitchen. guaifenesin (HUMIBID E) 400 MG TABS tablet Take 400 mg by mouth as needed.     . loratadine (CLARITIN) 10 MG tablet Take 10 mg by mouth daily as needed.     . Probiotic Product (PROBIOTIC PO) Take by mouth daily.    . ranitidine (ZANTAC) 300 MG tablet Take 1  tablet (300 mg total) by mouth at bedtime as needed for heartburn. 30 tablet 6   No facility-administered medications prior to visit.     No Known Allergies  Review of Systems  Constitutional: Negative for fever and malaise/fatigue.  HENT: Positive for congestion and sinus pain.   Eyes: Negative for blurred vision.  Respiratory: Negative for cough and shortness of breath.   Cardiovascular: Negative for chest pain, palpitations and leg swelling.  Gastrointestinal: Negative for vomiting.  Musculoskeletal: Positive for joint pain. Negative for back pain.  Skin: Negative for rash.  Neurological: Negative for loss of consciousness and headaches.       Objective:    Physical Exam  Constitutional: She is oriented to person, place, and time. She appears well-developed and well-nourished. No distress.  HENT:  Head: Normocephalic and atraumatic.  Right Ear: Hearing, tympanic membrane, external ear and ear canal normal.  Left Ear: Hearing,  tympanic membrane, external ear and ear canal normal.  Nose: Mucosal edema and rhinorrhea present. Right sinus exhibits no maxillary sinus tenderness and no frontal sinus tenderness. Left sinus exhibits no maxillary sinus tenderness and no frontal sinus tenderness.  Mouth/Throat: Mucous membranes are normal. Posterior oropharyngeal erythema present. No oropharyngeal exudate, posterior oropharyngeal edema or tonsillar abscesses.  Eyes: Conjunctivae and EOM are normal. Pupils are equal, round, and reactive to light. Right eye exhibits no discharge. Left eye exhibits no discharge.  Neck: Normal range of motion. Neck supple. No thyromegaly present.  Cardiovascular: Normal rate, regular rhythm and normal heart sounds.   No murmur heard. Pulmonary/Chest: Effort normal and breath sounds normal. No respiratory distress. She has no wheezes. She has no rales. She exhibits no tenderness.  Abdominal: Soft. Bowel sounds are normal. There is no tenderness.  Musculoskeletal: Normal range of motion. She exhibits no edema, tenderness or deformity.  Lymphadenopathy:    She has cervical adenopathy.  Neurological: She is alert and oriented to person, place, and time.  Skin: Skin is warm and dry. She is not diaphoretic.  Psychiatric: She has a normal mood and affect. Her speech is normal and behavior is normal. Judgment and thought content normal. Cognition and memory are normal.  Vitals reviewed.   BP 138/78 (BP Location: Left Arm, Patient Position: Sitting, Cuff Size: Normal)   Pulse 73   Temp 98 F (36.7 C) (Oral)   SpO2 98%  Wt Readings from Last 3 Encounters:  01/11/17 175 lb (79.4 kg)  11/16/16 173 lb (78.5 kg)  01/18/16 172 lb 2 oz (78.1 kg)     Lab Results  Component Value Date   WBC 8.0 01/18/2016   HGB 14.1 01/18/2016   HCT 44.2 01/18/2016   PLT 173.0 01/18/2016   GLUCOSE 87 01/18/2016   CHOL 144 01/18/2016   TRIG 51.0 01/18/2016   HDL 56.90 01/18/2016   LDLCALC 77 01/18/2016   ALT 16  01/18/2016   AST 20 01/18/2016   NA 142 01/18/2016   K 4.1 01/18/2016   CL 108 01/18/2016   CREATININE 0.81 01/18/2016   BUN 12 01/18/2016   CO2 24 01/18/2016   TSH 0.92 01/18/2016    Lab Results  Component Value Date   TSH 0.92 01/18/2016   Lab Results  Component Value Date   WBC 8.0 01/18/2016   HGB 14.1 01/18/2016   HCT 44.2 01/18/2016   MCV 85.2 01/18/2016   PLT 173.0 01/18/2016   Lab Results  Component Value Date   NA 142 01/18/2016   K 4.1 01/18/2016  CO2 24 01/18/2016   GLUCOSE 87 01/18/2016   BUN 12 01/18/2016   CREATININE 0.81 01/18/2016   BILITOT 0.3 01/18/2016   ALKPHOS 75 01/18/2016   AST 20 01/18/2016   ALT 16 01/18/2016   PROT 7.9 01/18/2016   ALBUMIN 4.3 01/18/2016   CALCIUM 9.4 01/18/2016   GFR 93.48 01/18/2016   Lab Results  Component Value Date   CHOL 144 01/18/2016   Lab Results  Component Value Date   HDL 56.90 01/18/2016   Lab Results  Component Value Date   LDLCALC 77 01/18/2016   Lab Results  Component Value Date   TRIG 51.0 01/18/2016   Lab Results  Component Value Date   CHOLHDL 3 01/18/2016   No results found for: HGBA1C     Assessment & Plan:   Problem List Items Addressed This Visit    None      I am having Ms. Rosengrant maintain her aspirin EC, loratadine, guaifenesin, Probiotic Product (PROBIOTIC PO), and ranitidine.  No orders of the defined types were placed in this encounter.   CMA served as Neurosurgeon during this visit. History, Physical and Plan performed by medical provider. Documentation and orders reviewed and attested to.  Crissie Sickles, [place]

## 2017-01-19 NOTE — Patient Instructions (Signed)

## 2017-01-23 NOTE — Assessment & Plan Note (Signed)
Patient encouraged to maintain heart healthy diet, regular exercise, adequate sleep. Consider daily probiotics. Take medications as prescribed. Given and reviewed copy of ACP documents from [place] Secretary of State and encouraged to complete and return 

## 2017-01-23 NOTE — Assessment & Plan Note (Signed)
Well controlled, no changes to meds. Encouraged heart healthy diet such as the DASH diet and exercise as tolerated.  °

## 2017-01-23 NOTE — Assessment & Plan Note (Signed)
Struggles with daily pain, encouraged to try ice and Lidocaine gel prn and continue to follow up with sports med.

## 2017-03-01 ENCOUNTER — Ambulatory Visit: Payer: PRIVATE HEALTH INSURANCE | Admitting: Family Medicine

## 2017-03-21 ENCOUNTER — Other Ambulatory Visit: Payer: Self-pay | Admitting: Family Medicine

## 2017-03-23 ENCOUNTER — Other Ambulatory Visit: Payer: Self-pay | Admitting: Family Medicine

## 2017-03-23 DIAGNOSIS — Z1231 Encounter for screening mammogram for malignant neoplasm of breast: Secondary | ICD-10-CM

## 2017-04-11 ENCOUNTER — Ambulatory Visit
Admission: RE | Admit: 2017-04-11 | Discharge: 2017-04-11 | Disposition: A | Discharge status: Home / Self Care | Payer: PRIVATE HEALTH INSURANCE | Source: Ambulatory Visit | Attending: Family Medicine | Admitting: Family Medicine

## 2017-04-11 DIAGNOSIS — Z1231 Encounter for screening mammogram for malignant neoplasm of breast: Secondary | ICD-10-CM

## 2017-09-27 ENCOUNTER — Ambulatory Visit (INDEPENDENT_AMBULATORY_CARE_PROVIDER_SITE_OTHER): Payer: PRIVATE HEALTH INSURANCE | Admitting: Family Medicine

## 2017-09-27 ENCOUNTER — Encounter: Payer: Self-pay | Admitting: Family Medicine

## 2017-09-27 DIAGNOSIS — M1711 Unilateral primary osteoarthritis, right knee: Secondary | ICD-10-CM | POA: Diagnosis not present

## 2017-09-27 NOTE — Patient Instructions (Addendum)
Good to see you  Jenna Johnson is your friend.  Stay active.  pennsaid pinkie amount topically 2 times daily as needed.   Back exercises 3 times a week  You are doing great overall.  See me again in 4-6 weeks just in case but I think you will be fine.

## 2017-09-27 NOTE — Progress Notes (Signed)
Jenna Johnson D.O. [facility] [address] [place], [place] [address] Phone: [phone] Subjective:     CC: Right knee pain follow-up  BJY:NWGNFAOZHYHPI:Subjective  Jenna CulverGeorge-Edna Y Johnson is a 61 y.o. female coming in for right knee pain that she feels has come from some sciatic pain that she has been experiencing.  Most of her pain is inferior to the knee joint. She is feeling pretty good today. She has been wearing the brace with physical activity but otherwise has not been using it due to some uncomfortable pressure over the knee area. She has also discontinued the orthotics due to the postings moving.      Past Medical History:  Diagnosis Date  . Allergic state   . Allergy   . Anemia   . Chicken pox as a child  . Dermatitis 09/18/2013  . Endometriosis   . GERD (gastroesophageal reflux disease)   . H/O atrial septal defect   . Hip pain, bilateral 09/18/2013  . Hypertension   . Lactose intolerance   . Measles as a child  . Myalgia and myositis 09/18/2013  . Preventative health care 01/07/2014  . TIA (transient ischemic attack) 2004  . TIA (transient ischemic attack) 09/18/2013  . UTI (urinary tract infection)    Past Surgical History:  Procedure Laterality Date  . ABDOMINAL HYSTERECTOMY  61 yrs old   still has one ovary  . APPENDECTOMY  61 yrs old  . ASD REPAIR  2006  . hole in heart    . RIGHT OOPHORECTOMY     ruptured ovarian cyst at age 61  . tia  surgery 2005   Social History   Social History  . Marital status: Married    Spouse name: N/A  . Number of children: N/A  . Years of education: N/A   Social History Main Topics  . Smoking status: Never Smoker  . Smokeless tobacco: Never Used  . Alcohol use Yes     Comment: occasionally  . Drug use: No  . Sexual activity: Yes    Partners: Male   Other Topics Concern  . None   Social History Narrative  . None   No Known Allergies Family History  Problem Relation Age of Onset  . Multiple sclerosis Mother     . Other Mother        blue tumor, air embolism,  . Diabetes Father [id number]       type 2  . Other Father        fatty liver, lactose intolerant  . Cancer Paternal Grandmother [id number]       breast  . Diabetes Paternal Grandmother   . Other Sister        s/p hysterectomy  . Breast cancer Maternal Grandmother   . Other Sister        s/p hysterectomy     Past medical history, social, surgical and family history all reviewed in electronic medical record.  No pertanent information unless stated regarding to the chief complaint.   Review of Systems:Review of systems updated and as accurate as of 09/27/17  No headache, visual changes, nausea, vomiting, diarrhea, constipation, dizziness, abdominal pain, skin rash, fevers, chills, night sweats, weight loss, swollen lymph nodes, body aches, joint swelling,chest pain, shortness of breath, mood changes. Positive muscle aches  Objective  Blood pressure 128/80, pulse 72, height 5' 0.5" (1.537 m), weight 173 lb (78.5 kg), SpO2 95 %. Systems examined below as of 09/27/17   General: No apparent distress alert  and oriented x3 mood and affect normal, dressed appropriately.  HEENT: Pupils equal, extraocular movements intact  Respiratory: Patient's speak in full sentences and does not appear short of breath  Cardiovascular: No lower extremity edema, non tender, no erythema  Skin: Warm dry intact with no signs of infection or rash on extremities or on axial skeleton.  Abdomen: Soft nontender  Neuro: Cranial nerves II through XII are intact, neurovascularly intact in all extremities with 2+ DTRs and 2+ pulses.  Lymph: No lymphadenopathy of posterior or anterior cervical chain or axillae bilaterally.  Gait normal with good balance and coordination.  MSK:  Non tender with full range of motion and good stability and symmetric strength and tone of shoulders, elbows, wrist, hip, and ankles bilaterally.  Knee: Right Mild valgus deformity with a large thigh to calf  ratio Tender over the patellofemoral joint ROM full in flexion and extension and lower leg rotation. Ligaments with solid consistent endpoints including ACL, PCL, LCL, MCL. Negative Mcmurray's, Apley's, and Thessalonian tests. Non painful patellar compression. Patellar glide with moderate crepitus. Patellar and quadriceps tendons unremarkable. Hamstring and quadriceps strength is normal.  Contralateral knee unremarkable     Impression and Recommendations:     This case required medical decision making of moderate complexity.      Note: This dictation was prepared with Dragon dictation along with smaller phrase technology. Any transcriptional errors that result from this process are unintentional.

## 2017-09-27 NOTE — Assessment & Plan Note (Signed)
Spent  25 minutes with patient face-to-face and had greater than 50% of counseling including as described in assessment and plan. Discussed with patient at this time. There is a possibility that this is more from a lumbar radiculopathy. Patient has had some mild back pain and feels like the pain did resolve after an audible pop in her back. Patient given home exercises today to see if I would be beneficial for more core strength and stability. Patient denies any instability at this point. Patient may need a custom brace secondary to her thigh to calf ratio. Patient will follow-up with me again in 4-6 weeks. Make sure patient is responding well to the conservative therapy.

## 2018-01-02 NOTE — Progress Notes (Signed)
Jenna ScaleZach Smith D.O. [facility] [address] [place], [place] [address] Phone: [phone] Subjective:    I'm seeing this patient by the request  of:    CC: Right knee pain follow-up  BJY:NWGNFAOZHYHPI:Subjective  Jenna Johnson is a 62 y.o. female coming in with complaint of right knee pain.  Found to have patellofemoral arthritis previously.  Last seen 3 months ago.  Has responded states that she had been exercising doing long walks ie. 1.5 miles and later that night she had some pain. The pain was sharp but has transitioned to dull. Patient has been doing steps, lunges and swimming. Swimming bothers her with the flutter kick. Pain is intermittent.  Patient rates the severity of pain is 7 out of 10.       Past Medical History:  Diagnosis Date  . Allergic state   . Allergy   . Anemia   . Chicken pox as a child  . Dermatitis 09/18/2013  . Endometriosis   . GERD (gastroesophageal reflux disease)   . H/O atrial septal defect   . Hip pain, bilateral 09/18/2013  . Hypertension   . Lactose intolerance   . Measles as a child  . Myalgia and myositis 09/18/2013  . Preventative health care 01/07/2014  . TIA (transient ischemic attack) 2004  . TIA (transient ischemic attack) 09/18/2013  . UTI (urinary tract infection)    Past Surgical History:  Procedure Laterality Date  . ABDOMINAL HYSTERECTOMY  62 yrs old   still has one ovary  . APPENDECTOMY  62 yrs old  . ASD REPAIR  2006  . hole in heart    . RIGHT OOPHORECTOMY     ruptured ovarian cyst at age 62  . tia  surgery 2005   Social History   Socioeconomic History  . Marital status: Married    Spouse name: None  . Number of children: None  . Years of education: None  . Highest education level: None  Social Needs  . Financial resource strain: None  . Food insecurity - worry: None  . Food insecurity - inability: None  . Transportation needs - medical: None  . Transportation needs - non-medical: None  Occupational  History  . None  Tobacco Use  . Smoking status: Never Smoker  . Smokeless tobacco: Never Used  Substance and Sexual Activity  . Alcohol use: Yes    Comment: occasionally  . Drug use: No  . Sexual activity: Yes    Partners: Male  Other Topics Concern  . None  Social History Narrative  . None   No Known Allergies Family History  Problem Relation Age of Onset  . Multiple sclerosis Mother   . Other Mother        blue tumor, air embolism,  . Diabetes Father [id number]       type 2  . Other Father        fatty liver, lactose intolerant  . Cancer Paternal Grandmother [id number]       breast  . Diabetes Paternal Grandmother   . Other Sister        s/p hysterectomy  . Breast cancer Maternal Grandmother   . Other Sister        s/p hysterectomy     Past medical history, social, surgical and family history all reviewed in electronic medical record.  No pertanent information unless stated regarding to the chief complaint.   Review of Systems:Review of systems updated and as accurate as of 01/03/18  No headache, visual changes, nausea, vomiting, diarrhea, constipation, dizziness, abdominal pain, skin rash, fevers, chills, night sweats, weight loss, swollen lymph nodes, body aches, joint swelling,  chest pain, shortness of breath, mood changes.  Positive muscle aches  Objective  Blood pressure 118/86, pulse 68, weight 179 lb (81.2 kg), SpO2 93 %. Systems examined below as of 01/03/18   General: No apparent distress alert and oriented x3 mood and affect normal, dressed appropriately.  HEENT: Pupils equal, extraocular movements intact  Respiratory: Patient's speak in full sentences and does not appear short of breath  Cardiovascular: No lower extremity edema, non tender, no erythema  Skin: Warm dry intact with no signs of infection or rash on extremities or on axial skeleton.  Abdomen: Soft nontender  Neuro: Cranial nerves II through XII are intact, neurovascularly intact in all extremities with  2+ DTRs and 2+ pulses.  Lymph: No lymphadenopathy of posterior or anterior cervical chain or axillae bilaterally.  Gait normal with good balance and coordination.  MSK:  Non tender with full range of motion and good stability and symmetric strength and tone of shoulders, elbows, wrist, hip and ankles bilaterally.  Knee: Right Normal to inspection with no erythema or effusion or obvious bony abnormalities. Tender over the patellofemoral joint ROM full in flexion and extension and lower leg rotation. Ligaments with solid consistent endpoints including ACL, PCL, LCL, MCL. Negative Mcmurray's, Apley's, and Thessalonian tests. Moderate painful patellar compression. Patellar glide with mild to moderate crepitus. Patellar and quadriceps tendons unremarkable. Hamstring and quadriceps strength is normal. Contralateral knee unremarkable  MSK US performed of: Right knee This study was ordered, performed, and interpreted by Terrilee Files D.O.  Knee: Right knee shows narrowing of the patellofemoral joint.  Trace effusion noted of the suprapatellar pouch.  Patient does have what appears to be grade IV chondromalacia underneath the patella.  Moderate narrowing of the medial and lateral joint line mild degenerative changes of the meniscus  IMPRESSION: Severe patellofemoral arthritis   Impression and Recommendations:     This case required medical decision making of moderate complexity.      Note: This dictation was prepared with Dragon dictation along with smaller phrase technology. Any transcriptional errors that result from this process are unintentional.

## 2018-01-03 ENCOUNTER — Encounter: Payer: Self-pay | Admitting: Family Medicine

## 2018-01-03 ENCOUNTER — Ambulatory Visit: Payer: PRIVATE HEALTH INSURANCE | Admitting: Family Medicine

## 2018-01-03 ENCOUNTER — Ambulatory Visit: Payer: Self-pay

## 2018-01-03 VITALS — BP 118/86 | HR 68 | Wt 179.0 lb

## 2018-01-03 DIAGNOSIS — G8929 Other chronic pain: Secondary | ICD-10-CM

## 2018-01-03 DIAGNOSIS — M1711 Unilateral primary osteoarthritis, right knee: Secondary | ICD-10-CM

## 2018-01-03 DIAGNOSIS — M25561 Pain in right knee: Secondary | ICD-10-CM

## 2018-01-03 MED ORDER — VITAMIN D (ERGOCALCIFEROL) 1.25 MG (50000 UNIT) PO CAPS: 50000.0000 [IU] | ORAL_CAPSULE | ORAL | 0 refills | Status: DC

## 2018-01-03 NOTE — Assessment & Plan Note (Signed)
Patient is doing remarkably well overall.  We will a lot of the different exercises seem to be what is contributing to some of the discomfort and pain.  We discussed home exercises.  Discussed which activities doing which wants to avoid.  Patient given topical anti-inflammatories.  Encourage patient to follow-up in 4 weeks if not completely resolved.

## 2018-01-03 NOTE — Patient Instructions (Addendum)
No step ups and no lunges, ever! Ice 20 minutes 2 times daily. Usually after activity and before bed. Exercises 3 times a week.  This is all form the underside of the knee cap  My stretches maybe 3 times a week  Once weekly vitamin D for 8 weeks  pennsaid pinkie amount topically 2 times daily as needed.  Make an appointment in 4 weeks just in case. But if good then you can cancel when you need me

## 2018-01-22 ENCOUNTER — Encounter: Payer: PRIVATE HEALTH INSURANCE | Admitting: Family Medicine

## 2018-01-23 ENCOUNTER — Encounter: Payer: Self-pay | Admitting: Family Medicine

## 2018-01-23 ENCOUNTER — Ambulatory Visit (INDEPENDENT_AMBULATORY_CARE_PROVIDER_SITE_OTHER): Payer: PRIVATE HEALTH INSURANCE | Admitting: Family Medicine

## 2018-01-23 VITALS — BP 122/76 | HR 68 | Temp 97.8°F | Resp 18 | Ht 60.0 in | Wt 175.8 lb

## 2018-01-23 DIAGNOSIS — T7840XA Allergy, unspecified, initial encounter: Secondary | ICD-10-CM | POA: Diagnosis not present

## 2018-01-23 DIAGNOSIS — M8580 Other specified disorders of bone density and structure, unspecified site: Secondary | ICD-10-CM | POA: Diagnosis not present

## 2018-01-23 DIAGNOSIS — R32 Unspecified urinary incontinence: Secondary | ICD-10-CM | POA: Diagnosis not present

## 2018-01-23 DIAGNOSIS — N390 Urinary tract infection, site not specified: Secondary | ICD-10-CM

## 2018-01-23 DIAGNOSIS — I10 Essential (primary) hypertension: Secondary | ICD-10-CM

## 2018-01-23 DIAGNOSIS — Z0000 Encounter for general adult medical examination without abnormal findings: Secondary | ICD-10-CM

## 2018-01-23 DIAGNOSIS — M791 Myalgia, unspecified site: Secondary | ICD-10-CM

## 2018-01-23 DIAGNOSIS — K219 Gastro-esophageal reflux disease without esophagitis: Secondary | ICD-10-CM | POA: Diagnosis not present

## 2018-01-23 DIAGNOSIS — M1711 Unilateral primary osteoarthritis, right knee: Secondary | ICD-10-CM

## 2018-01-23 LAB — COMPREHENSIVE METABOLIC PANEL
ALBUMIN: 4.3 g/dL (ref 3.5–5.2)
ALK PHOS: 66 U/L (ref 39–117)
ALT: 15 U/L (ref 0–35)
AST: 16 U/L (ref 0–37)
BUN: 15 mg/dL (ref 6–23)
CALCIUM: 9.7 mg/dL (ref 8.4–10.5)
CO2: 30 mEq/L (ref 19–32)
Chloride: 104 mEq/L (ref 96–112)
Creatinine, Ser: 0.82 mg/dL (ref 0.40–1.20)
GFR: 91.53 mL/min (ref >60.00)
Glucose, Bld: 81 mg/dL (ref 70–99)
POTASSIUM: 4.1 meq/L (ref 3.5–5.1)
Sodium: 140 mEq/L (ref 135–145)
TOTAL PROTEIN: 7.7 g/dL (ref 6.0–8.3)
Total Bilirubin: 0.3 mg/dL (ref 0.2–1.2)

## 2018-01-23 LAB — URINALYSIS
Bilirubin Urine: NEGATIVE
Hgb urine dipstick: NEGATIVE
KETONES UR: NEGATIVE
LEUKOCYTES UA: NEGATIVE
Nitrite: NEGATIVE
PH: 7 (ref 5.0–8.0)
SPECIFIC GRAVITY, URINE: 1.01 (ref 1.000–1.030)
Total Protein, Urine: NEGATIVE
Urine Glucose: NEGATIVE
Urobilinogen, UA: 0.2 (ref 0.0–1.0)

## 2018-01-23 LAB — CBC
HCT: 42.6 % (ref 36.0–46.0)
HEMOGLOBIN: 13.8 g/dL (ref 12.0–15.0)
MCHC: 32.4 g/dL (ref 30.0–36.0)
MCV: 84.6 fl (ref 78.0–100.0)
PLATELETS: 184 10*3/uL (ref 150.0–400.0)
RBC: 5.03 Mil/uL (ref 3.87–5.11)
RDW: 14.2 % (ref 11.5–15.5)
WBC: 7 10*3/uL (ref 4.0–10.5)

## 2018-01-23 LAB — LIPID PANEL
CHOL/HDL RATIO: 3
Cholesterol: 146 mg/dL (ref 0–200)
HDL: 50.2 mg/dL (ref >39.00)
LDL Cholesterol: 83 mg/dL (ref 0–99)
NONHDL: 96
Triglycerides: 63 mg/dL (ref 0.0–149.0)
VLDL: 12.6 mg/dL (ref 0.0–40.0)

## 2018-01-23 LAB — TSH: TSH: 1.02 u[IU]/mL (ref 0.35–4.50)

## 2018-01-23 LAB — MAGNESIUM: Magnesium: 2 mg/dL (ref 1.5–2.5)

## 2018-01-23 MED ORDER — RANITIDINE HCL 300 MG PO TABS: ORAL_TABLET | ORAL | 4 refills | Status: DC

## 2018-01-23 NOTE — Patient Instructions (Addendum)
Flonase/Fluticasone nasal spray daily for allergies Consider a full ranitidine daily if the PND continues Consider Loratadine twice daily for bad allergic symptoms Encouraged increased rest and hydration, add probiotics, zinc such as Coldeze or Xicam. Treat fevers as needed. Vitamin C, Elderberry, aged or black garlic and Umcka help to boost the immune response   hyland's leg cramp med Preventive Care 40-64 Years, Female Preventive care refers to lifestyle choices and visits with your health care provider that can promote health and wellness. What does preventive care include?  A yearly physical exam. This is also called an annual well check.  Dental exams once or twice a year.  Routine eye exams. Ask your health care provider how often you should have your eyes checked.  Personal lifestyle choices, including: ? Daily care of your teeth and gums. ? Regular physical activity. ? Eating a healthy diet. ? Avoiding tobacco and drug use. ? Limiting alcohol use. ? Practicing safe sex. ? Taking low-dose aspirin daily starting at age 7. ? Taking vitamin and mineral supplements as recommended by your health care provider. What happens during an annual well check? The services and screenings done by your health care provider during your annual well check will depend on your age, overall health, lifestyle risk factors, and family history of disease. Counseling Your health care provider may ask you questions about your:  Alcohol use.  Tobacco use.  Drug use.  Emotional well-being.  Home and relationship well-being.  Sexual activity.  Eating habits.  Work and work Statistician.  Method of birth control.  Menstrual cycle.  Pregnancy history.  Screening You may have the following tests or measurements:  Height, weight, and BMI.  Blood pressure.  Lipid and cholesterol levels. These may be checked every 5 years, or more frequently if you are over 34 years old.  Skin  check.  Lung cancer screening. You may have this screening every year starting at age 78 if you have a 30-pack-year history of smoking and currently smoke or have quit within the past 15 years.  Fecal occult blood test (FOBT) of the stool. You may have this test every year starting at age 30.  Flexible sigmoidoscopy or colonoscopy. You may have a sigmoidoscopy every 5 years or a colonoscopy every 10 years starting at age 59.  Hepatitis C blood test.  Hepatitis B blood test.  Sexually transmitted disease (STD) testing.  Diabetes screening. This is done by checking your blood sugar (glucose) after you have not eaten for a while (fasting). You may have this done every 1-3 years.  Mammogram. This may be done every 1-2 years. Talk to your health care provider about when you should start having regular mammograms. This may depend on whether you have a family history of breast cancer.  BRCA-related cancer screening. This may be done if you have a family history of breast, ovarian, tubal, or peritoneal cancers.  Pelvic exam and Pap test. This may be done every 3 years starting at age 90. Starting at age 67, this may be done every 5 years if you have a Pap test in combination with an HPV test.  Bone density scan. This is done to screen for osteoporosis. You may have this scan if you are at high risk for osteoporosis.  Discuss your test results, treatment options, and if necessary, the need for more tests with your health care provider. Vaccines Your health care provider may recommend certain vaccines, such as:  Influenza vaccine. This is recommended every year.  Tetanus, diphtheria, and acellular pertussis (Tdap, Td) vaccine. You may need a Td booster every 10 years.  Varicella vaccine. You may need this if you have not been vaccinated.  Zoster vaccine. You may need this after age 65.  Measles, mumps, and rubella (MMR) vaccine. You may need at least one dose of MMR if you were born in 1957  or later. You may also need a second dose.  Pneumococcal 13-valent conjugate (PCV13) vaccine. You may need this if you have certain conditions and were not previously vaccinated.  Pneumococcal polysaccharide (PPSV23) vaccine. You may need one or two doses if you smoke cigarettes or if you have certain conditions.  Meningococcal vaccine. You may need this if you have certain conditions.  Hepatitis A vaccine. You may need this if you have certain conditions or if you travel or work in places where you may be exposed to hepatitis A.  Hepatitis B vaccine. You may need this if you have certain conditions or if you travel or work in places where you may be exposed to hepatitis B.  Haemophilus influenzae type b (Hib) vaccine. You may need this if you have certain conditions.  Talk to your health care provider about which screenings and vaccines you need and how often you need them. This information is not intended to replace advice given to you by your health care provider. Make sure you discuss any questions you have with your health care provider. Document Released: 12/18/2015 Document Revised: 08/10/2016 Document Reviewed: 09/22/2015 Elsevier Interactive Patient Education  [employer].

## 2018-01-23 NOTE — Assessment & Plan Note (Signed)
Well controlled, no changes to meds. Encouraged heart healthy diet such as the DASH diet and exercise as tolerated.  °

## 2018-01-23 NOTE — Assessment & Plan Note (Signed)
Patient encouraged to maintain heart healthy diet, regular exercise, adequate sleep. Consider daily probiotics. Take medications as prescribed 

## 2018-01-23 NOTE — Progress Notes (Signed)
Subjective:  I acted as a Neurosurgeonscribe for Dr. Abner GreenspanBlyth. Princess, [place]MA  Patient ID: Jenna Johnson, female    DOB: [name] 15, 1956, 62 y.o.   MRN: [id number]  No chief complaint on file.   HPI  Patient is in today for an annual exam and follow up on chronic medical concerns including hypertension, anemia and allergies. She feels well today. No recent febrile illness or hospitalizations. No trouble with activities of daily living. Is maintaining a heart healthy diet and stays active. Denies CP/palp/SOB/HA/congestion/fevers/GI or GU c/o. Taking meds as prescribed  Patient Care Team: Bradd CanaryBlyth, Keandre Linden A, MD as PCP - General (Family Medicine) Tyrone SchimkeStonecipher, Karl, MD as Consulting Physician (Ophthalmology)   Past Medical History:  Diagnosis Date  . Allergic state   . Allergy   . Anemia   . Chicken pox as a child  . Dermatitis 09/18/2013  . Endometriosis   . GERD (gastroesophageal reflux disease)   . H/O atrial septal defect   . Hip pain, bilateral 09/18/2013  . Hypertension   . Lactose intolerance   . Measles as a child  . Myalgia and myositis 09/18/2013  . Preventative health care 01/07/2014  . TIA (transient ischemic attack) 2004  . TIA (transient ischemic attack) 09/18/2013  . UTI (urinary tract infection)     Past Surgical History:  Procedure Laterality Date  . ABDOMINAL HYSTERECTOMY  62 yrs old   still has one ovary  . APPENDECTOMY  62 yrs old  . ASD REPAIR  2006  . hole in heart    . RIGHT OOPHORECTOMY     ruptured ovarian cyst at age 62  . tia  surgery 2005    Family History  Problem Relation Age of Onset  . Multiple sclerosis Mother   . Other Mother        blue tumor, air embolism,  . Diabetes Father [id number]       type 2  . Other Father        fatty liver, lactose intolerant  . Cancer Paternal Grandmother [phone]       breast  . Diabetes Paternal Grandmother   . Other Sister        s/p hysterectomy  . Breast cancer Maternal Grandmother   . Other Sister        s/p  hysterectomy    Social History   Socioeconomic History  . Marital status: Married    Spouse name: Not on file  . Number of children: Not on file  . Years of education: Not on file  . Highest education level: Not on file  Social Needs  . Financial resource strain: Not on file  . Food insecurity - worry: Not on file  . Food insecurity - inability: Not on file  . Transportation needs - medical: Not on file  . Transportation needs - non-medical: Not on file  Occupational History  . Not on file  Tobacco Use  . Smoking status: Never Smoker  . Smokeless tobacco: Never Used  Substance and Sexual Activity  . Alcohol use: Yes    Comment: occasionally  . Drug use: No  . Sexual activity: Yes    Partners: Male  Other Topics Concern  . Not on file  Social History Narrative  . Not on file    Outpatient Medications Prior to Visit  Medication Sig Dispense Refill  . aspirin EC 81 MG tablet Take 1 tablet (81 mg total) by mouth daily.    Marland Kitchen. guaifenesin (HUMIBID E) 400 MG  TABS tablet Take 400 mg by mouth as needed.     . loratadine (CLARITIN) 10 MG tablet Take 10 mg by mouth daily as needed.     . Vitamin D, Ergocalciferol, (DRISDOL) 50000 units CAPS capsule Take 1 capsule (50,000 Units total) by mouth every 7 (seven) days. 12 capsule 0  . Probiotic Product (PROBIOTIC PO) Take by mouth daily.    . ranitidine (ZANTAC) 300 MG tablet TAKE 1 TABLET(300 MG) BY MOUTH AT BEDTIME AS NEEDED FOR HEARTBURN 30 tablet 11   No facility-administered medications prior to visit.     No Known Allergies  Review of Systems  Constitutional: Negative for chills, fever and malaise/fatigue.  HENT: Positive for congestion. Negative for hearing loss.   Eyes: Negative for discharge.  Respiratory: Negative for cough, sputum production and shortness of breath.   Cardiovascular: Negative for chest pain, palpitations and leg swelling.  Gastrointestinal: Negative for abdominal pain, blood in stool, constipation,  diarrhea, heartburn, nausea and vomiting.  Genitourinary: Negative for dysuria, frequency, hematuria and urgency.  Musculoskeletal: Negative for back pain, falls and myalgias.  Skin: Negative for rash.  Neurological: Negative for dizziness, sensory change, loss of consciousness, weakness and headaches.  Endo/Heme/Allergies: Negative for environmental allergies. Does not bruise/bleed easily.  Psychiatric/Behavioral: Negative for depression and suicidal ideas. The patient is not nervous/anxious and does not have insomnia.        Objective:    Physical Exam  Constitutional: She is oriented to person, place, and time. She appears well-developed and well-nourished. No distress.  HENT:  Head: Normocephalic and atraumatic.  Eyes: Conjunctivae are normal.  Neck: Neck supple. No thyromegaly present.  Cardiovascular: Normal rate, regular rhythm and normal heart sounds.  No murmur heard. Pulmonary/Chest: Effort normal and breath sounds normal. No respiratory distress.  Abdominal: Soft. Bowel sounds are normal. She exhibits no distension and no mass. There is no tenderness.  Musculoskeletal: She exhibits no edema.  Lymphadenopathy:    She has no cervical adenopathy.  Neurological: She is alert and oriented to person, place, and time.  Skin: Skin is warm and dry.  Psychiatric: She has a normal mood and affect. Her behavior is normal.    BP 122/76 (BP Location: Left Arm, Patient Position: Sitting, Cuff Size: Normal)   Pulse 68   Temp 97.8 F (36.6 C) (Oral)   Resp 18   Ht 5' (1.524 m)   Wt 175 lb 12.8 oz (79.7 kg)   SpO2 98%   BMI 34.33 kg/m  Wt Readings from Last 3 Encounters:  01/23/18 175 lb 12.8 oz (79.7 kg)  01/03/18 179 lb (81.2 kg)  09/27/17 173 lb (78.5 kg)   BP Readings from Last 3 Encounters:  01/23/18 122/76  01/03/18 118/86  09/27/17 128/80     Immunization History  Administered Date(s) Administered  . Influenza,inj,Quad PF,6+ Mos 09/16/2013  .  Influenza-Unspecified 09/04/2014, 10/01/2015  . Pneumococcal Conjugate-13 09/16/2013, 11/02/2015  . Tdap 12/05/2008  . Zoster 10/01/2015    Health Maintenance  Topic Date Due  . Hepatitis C Screening  1956/06/18  . HIV Screening  04/24/1973  . COLONOSCOPY  12/05/2017  . PAP SMEAR  07/12/2018  . TETANUS/TDAP  12/05/2018  . MAMMOGRAM  04/12/2019  . INFLUENZA VACCINE  Completed    Lab Results  Component Value Date   WBC 7.0 01/23/2018   HGB 13.8 01/23/2018   HCT 42.6 01/23/2018   PLT 184.0 01/23/2018   GLUCOSE 81 01/23/2018   CHOL 146 01/23/2018   TRIG 63.0 01/23/2018  HDL 50.20 01/23/2018   LDLCALC 83 01/23/2018   ALT 15 01/23/2018   AST 16 01/23/2018   NA 140 01/23/2018   K 4.1 01/23/2018   CL 104 01/23/2018   CREATININE 0.82 01/23/2018   BUN 15 01/23/2018   CO2 30 01/23/2018   TSH 1.02 01/23/2018    Lab Results  Component Value Date   TSH 1.02 01/23/2018   Lab Results  Component Value Date   WBC 7.0 01/23/2018   HGB 13.8 01/23/2018   HCT 42.6 01/23/2018   MCV 84.6 01/23/2018   PLT 184.0 01/23/2018   Lab Results  Component Value Date   NA 140 01/23/2018   K 4.1 01/23/2018   CO2 30 01/23/2018   GLUCOSE 81 01/23/2018   BUN 15 01/23/2018   CREATININE 0.82 01/23/2018   BILITOT 0.3 01/23/2018   ALKPHOS 66 01/23/2018   AST 16 01/23/2018   ALT 15 01/23/2018   PROT 7.7 01/23/2018   ALBUMIN 4.3 01/23/2018   CALCIUM 9.7 01/23/2018   GFR 91.53 01/23/2018   Lab Results  Component Value Date   CHOL 146 01/23/2018   Lab Results  Component Value Date   HDL 50.20 01/23/2018   Lab Results  Component Value Date   LDLCALC 83 01/23/2018   Lab Results  Component Value Date   TRIG 63.0 01/23/2018   Lab Results  Component Value Date   CHOLHDL 3 01/23/2018   No results found for: HGBA1C       Assessment & Plan:   Problem List Items Addressed This Visit    Allergic state    Encouraged nasal saline, antihistamines and flonase as needed       Hypertension    Well controlled, no changes to meds. Encouraged heart healthy diet such as the DASH diet and exercise as tolerated.       Relevant Orders   Comprehensive metabolic panel (Completed)   CBC (Completed)   Lipid panel (Completed)   TSH (Completed)   UTI (urinary tract infection)    Urinalysis unremarkable      GERD (gastroesophageal reflux disease)    Avoid offending foods, start probiotics. Do not eat large meals in late evening and consider raising head of bed.       Relevant Medications   ranitidine (ZANTAC) 300 MG tablet   Preventative health care    Patient encouraged to maintain heart healthy diet, regular exercise, adequate sleep. Consider daily probiotics. Take medications as prescribed      Relevant Orders   Lipid panel (Completed)   Patellofemoral arthritis of right knee    Improved since stopping lunges.       Osteopenia    Encouraged to get adequate exercise, calcium and vitamin d intake       Other Visit Diagnoses    Urinary incontinence, unspecified type    -  Primary   Relevant Orders   Urinalysis (Completed)   Myalgia       Relevant Orders   Magnesium (Completed)      I have discontinued [name] Y. Phoenix's Probiotic Product (PROBIOTIC PO). I am also having her maintain her aspirin EC, loratadine, guaifenesin, Vitamin D (Ergocalciferol), and ranitidine.  Meds ordered this encounter  Medications  . ranitidine (ZANTAC) 300 MG tablet    Sig: TAKE 1 TABLET(300 MG) BY MOUTH AT BEDTIME AS NEEDED FOR HEARTBURN    Dispense:  90 tablet    Refill:  4    CMA served as scribe during this visit. History, Physical and  Plan performed by medical provider. Documentation and orders reviewed and attested to.  Penni Homans, MD

## 2018-01-23 NOTE — Assessment & Plan Note (Signed)
Improved since stopping lunges.

## 2018-01-23 NOTE — Assessment & Plan Note (Signed)
Encouraged to get adequate exercise, calcium and vitamin d intake 

## 2018-01-24 NOTE — Assessment & Plan Note (Signed)
Encouraged nasal saline, antihistamines and flonase as needed

## 2018-01-24 NOTE — Assessment & Plan Note (Signed)
Urinalysis unremarkable 

## 2018-01-24 NOTE — Assessment & Plan Note (Signed)
Avoid offending foods, start probiotics. Do not eat large meals in late evening and consider raising head of bed.  

## 2018-01-31 ENCOUNTER — Ambulatory Visit: Payer: PRIVATE HEALTH INSURANCE | Admitting: Family Medicine

## 2018-03-06 ENCOUNTER — Encounter: Payer: Self-pay | Admitting: Family Medicine

## 2018-03-12 NOTE — Telephone Encounter (Signed)
AD has been filed to chart

## 2018-04-03 ENCOUNTER — Other Ambulatory Visit: Payer: Self-pay | Admitting: Family Medicine

## 2018-04-20 ENCOUNTER — Other Ambulatory Visit: Payer: Self-pay | Admitting: Family Medicine

## 2018-04-20 DIAGNOSIS — Z1231 Encounter for screening mammogram for malignant neoplasm of breast: Secondary | ICD-10-CM

## 2018-06-26 ENCOUNTER — Ambulatory Visit
Admission: RE | Admit: 2018-06-26 | Discharge: 2018-06-26 | Disposition: A | Discharge status: Home / Self Care | Payer: PRIVATE HEALTH INSURANCE | Source: Ambulatory Visit | Attending: Family Medicine | Admitting: Family Medicine

## 2018-06-26 DIAGNOSIS — Z1231 Encounter for screening mammogram for malignant neoplasm of breast: Secondary | ICD-10-CM

## 2018-07-06 ENCOUNTER — Encounter: Payer: Self-pay | Admitting: Family Medicine

## 2018-08-07 ENCOUNTER — Encounter: Payer: Self-pay | Admitting: Family Medicine

## 2018-08-16 ENCOUNTER — Encounter (INDEPENDENT_AMBULATORY_CARE_PROVIDER_SITE_OTHER): Payer: Self-pay

## 2018-08-27 ENCOUNTER — Encounter (INDEPENDENT_AMBULATORY_CARE_PROVIDER_SITE_OTHER): Payer: Self-pay | Admitting: Family Medicine

## 2018-08-27 ENCOUNTER — Ambulatory Visit (INDEPENDENT_AMBULATORY_CARE_PROVIDER_SITE_OTHER): Payer: PRIVATE HEALTH INSURANCE | Admitting: Family Medicine

## 2018-08-27 VITALS — BP 155/87 | HR 74 | Ht 61.0 in | Wt 171.0 lb

## 2018-08-27 DIAGNOSIS — R5383 Other fatigue: Secondary | ICD-10-CM

## 2018-08-27 DIAGNOSIS — Z9189 Other specified personal risk factors, not elsewhere classified: Secondary | ICD-10-CM

## 2018-08-27 DIAGNOSIS — E669 Obesity, unspecified: Secondary | ICD-10-CM

## 2018-08-27 DIAGNOSIS — R030 Elevated blood-pressure reading, without diagnosis of hypertension: Secondary | ICD-10-CM

## 2018-08-27 DIAGNOSIS — R0602 Shortness of breath: Secondary | ICD-10-CM | POA: Diagnosis not present

## 2018-08-27 DIAGNOSIS — Z1331 Encounter for screening for depression: Secondary | ICD-10-CM

## 2018-08-27 DIAGNOSIS — Z0289 Encounter for other administrative examinations: Secondary | ICD-10-CM

## 2018-08-27 DIAGNOSIS — Z6832 Body mass index (BMI) 32.0-32.9, adult: Secondary | ICD-10-CM

## 2018-08-28 LAB — COMPREHENSIVE METABOLIC PANEL
ALBUMIN: 4.4 g/dL (ref 3.6–4.8)
ALT: 12 IU/L (ref 0–32)
AST: 16 IU/L (ref 0–40)
Albumin/Globulin Ratio: 1.6 (ref 1.2–2.2)
Alkaline Phosphatase: 85 IU/L (ref 39–117)
BILIRUBIN TOTAL: 0.3 mg/dL (ref 0.0–1.2)
BUN/Creatinine Ratio: 15 (ref 12–28)
BUN: 12 mg/dL (ref 8–27)
CHLORIDE: 102 mmol/L (ref 96–106)
CO2: 23 mmol/L (ref 20–29)
Calcium: 9.3 mg/dL (ref 8.7–10.3)
Creatinine, Ser: 0.82 mg/dL (ref 0.57–1.00)
GFR calc non Af Amer: 78 mL/min/{1.73_m2} (ref >59)
GFR, EST AFRICAN AMERICAN: 90 mL/min/{1.73_m2} (ref >59)
Globulin, Total: 2.7 g/dL (ref 1.5–4.5)
Glucose: 81 mg/dL (ref 65–99)
Potassium: 4.3 mmol/L (ref 3.5–5.2)
Sodium: 139 mmol/L (ref 134–144)
TOTAL PROTEIN: 7.1 g/dL (ref 6.0–8.5)

## 2018-08-28 LAB — CBC WITH DIFFERENTIAL
BASOS ABS: 0 10*3/uL (ref 0.0–0.2)
Basos: 0 %
EOS (ABSOLUTE): 0 10*3/uL (ref 0.0–0.4)
Eos: 1 %
HEMOGLOBIN: 12.8 g/dL (ref 11.1–15.9)
Hematocrit: 38.9 % (ref 34.0–46.6)
IMMATURE GRANS (ABS): 0 10*3/uL (ref 0.0–0.1)
Immature Granulocytes: 0 %
LYMPHS: 33 %
Lymphocytes Absolute: 2.5 10*3/uL (ref 0.7–3.1)
MCH: 26.8 pg (ref 26.6–33.0)
MCHC: 32.9 g/dL (ref 31.5–35.7)
MCV: 82 fL (ref 79–97)
Monocytes Absolute: 0.5 10*3/uL (ref 0.1–0.9)
Monocytes: 7 %
Neutrophils Absolute: 4.4 10*3/uL (ref 1.4–7.0)
Neutrophils: 59 %
RBC: 4.77 x10E6/uL (ref 3.77–5.28)
RDW: 13.1 % (ref 12.3–15.4)
WBC: 7.5 10*3/uL (ref 3.4–10.8)

## 2018-08-28 LAB — T4, FREE: FREE T4: 1.19 ng/dL (ref 0.82–1.77)

## 2018-08-28 LAB — LIPID PANEL WITH LDL/HDL RATIO
CHOLESTEROL TOTAL: 146 mg/dL (ref 100–199)
HDL: 61 mg/dL (ref >39)
LDL Calculated: 73 mg/dL (ref 0–99)
LDl/HDL Ratio: 1.2 ratio (ref 0.0–3.2)
Triglycerides: 61 mg/dL (ref 0–149)
VLDL Cholesterol Cal: 12 mg/dL (ref 5–40)

## 2018-08-28 LAB — FOLATE: Folate: 7.7 ng/mL (ref >3.0)

## 2018-08-28 LAB — HEMOGLOBIN A1C
ESTIMATED AVERAGE GLUCOSE: 123 mg/dL
HEMOGLOBIN A1C: 5.9 % — AB (ref 4.8–5.6)

## 2018-08-28 LAB — INSULIN, RANDOM: INSULIN: 8.4 u[IU]/mL (ref 2.6–24.9)

## 2018-08-28 LAB — VITAMIN B12: Vitamin B-12: 615 pg/mL (ref 232–1245)

## 2018-08-28 LAB — TSH: TSH: 0.958 u[IU]/mL (ref 0.450–4.500)

## 2018-08-28 LAB — VITAMIN D 25 HYDROXY (VIT D DEFICIENCY, FRACTURES): VIT D 25 HYDROXY: 25.4 ng/mL — AB (ref 30.0–100.0)

## 2018-08-28 LAB — T3: T3, Total: 120 ng/dL (ref 71–180)

## 2018-08-28 NOTE — Progress Notes (Signed)
Office: [phone]  /  Fax: [phone]   Dear Dr. Abner Greenspan,   Thank you for referring ALENI ANDRUS to our clinic. The following note includes my evaluation and treatment recommendations.  HPI:   Chief Complaint: OBESITY    OFILIA RAYON has been referred by Misty Stanley A. Abner Greenspan, MD for consultation regarding her obesity and obesity related comorbidities.    NAAMAH BOGGESS (MR# [id number]) is a 62 y.o. female who presents on 08/28/2018 for obesity evaluation and treatment. Current BMI is Body mass index is 32.31 kg/m.Marland Kitchen Marcia has been struggling with her weight for many years and has been unsuccessful in either losing weight, maintaining weight loss, or reaching her healthy weight goal.     Raynelle Fanning attended our information session and states she is currently in the action stage of change and ready to dedicate time achieving and maintaining a healthier weight. Story is interested in becoming our patient and working on intensive lifestyle modifications including (but not limited to) diet, exercise and weight loss.    Sueanne states her family eats meals together she thinks her family will eat healthier with  her she struggles with family and or coworkers weight loss sabotage her desired weight loss is 32 to 37 lbs she started gaining weight in her late 72's and 50's her heaviest weight ever was 180 lbs. she is a picky eater and doesn't like to eat healthier foods  she has significant food cravings issues  she skips meals frequently she is frequently drinking liquids with calories she frequently makes poor food choices she frequently eats larger portions than normal  she has binge eating behaviors she struggles with emotional eating    Fatigue [name] feels her energy is lower than it should be. This has worsened with weight gain and has not worsened recently. [name] admits to daytime somnolence and  denies waking up still tired. Patient is  at risk for obstructive sleep apnea. Patent has a history of symptoms of daytime fatigue. Patient generally gets 7 or 8 hours of sleep per night, and states they generally have generally restful sleep. Snoring is present. Apneic episodes are not present. Epworth Sleepiness Score is 10  Dyspnea on exertion [name] notes increasing shortness of breath with exercising and seems to be worsening over time with weight gain. She notes getting out of breath sooner with activity than she used to. This has not gotten worse recently. [name] denies orthopnea.  Elevated Blood Pressure without history of hypertension CATIA TODOROV is a 62 y.o. female with elevated blood pressure, and with no history or hypertension. This may be due to white coat syndrome. She is not on medications and GUISELLE MIAN denies chest pain. She is attempting to work on weight loss to help control her blood pressure with the goal of decreasing her risk of heart attack and stroke. George-Ednas blood pressure is not currently controlled.  At risk for cardiovascular disease Veta is at a higher than average risk for cardiovascular disease due to obesity and elevated blood pressure. She currently denies any chest pain.  Depression Screen [name]'s Food and Mood (modified PHQ-9) score was  Depression screen PHQ 2/9 08/27/2018  Decreased Interest 1  Down, Depressed, Hopeless 0  PHQ - 2 Score 1  Altered sleeping 0  Tired, decreased energy 0  Change in appetite 1  Feeling bad or failure about yourself  0  Trouble concentrating 1  Moving slowly or fidgety/restless 0  Suicidal thoughts 0  PHQ-9 Score 3  Difficult doing work/chores Not difficult at all    ALLERGIES: No Known Allergies  MEDICATIONS: Current Outpatient Medications on File Prior to Visit  Medication Sig Dispense Refill  . acetaminophen (TYLENOL) 650 MG CR tablet Take 650 mg by mouth every 8 (eight) hours as needed for pain.    Marland Kitchen  aspirin EC 81 MG tablet Take 1 tablet (81 mg total) by mouth daily.    Marland Kitchen ibuprofen (ADVIL,MOTRIN) 200 MG tablet Take 200 mg by mouth every 6 (six) hours as needed.    . loratadine (CLARITIN) 10 MG tablet Take 10 mg by mouth daily as needed.     . ranitidine (ZANTAC) 300 MG tablet TAKE 1 TABLET(300 MG) BY MOUTH AT BEDTIME AS NEEDED FOR HEARTBURN 90 tablet 4  . Vitamin D, Ergocalciferol, (DRISDOL) 50000 units CAPS capsule Take 1 capsule (50,000 Units total) by mouth every 7 (seven) days. 12 capsule 0   No current facility-administered medications on file prior to visit.     PAST MEDICAL HISTORY: Past Medical History:  Diagnosis Date  . Allergic state   . Allergy   . Anemia   . Arthritis of knee, right   . Back pain   . Bursitis of both hips   . Chicken pox as a child  . Constipation   . Dermatitis 09/18/2013  . Endometriosis   . GERD (gastroesophageal reflux disease)   . H/O atrial septal defect   . Hip pain, bilateral 09/18/2013  . Hypertension   . IBS (irritable bowel syndrome)   . Joint pain   . Lactose intolerance   . Leg edema   . Measles as a child  . Multiple food allergies    Sugar  . Myalgia and myositis 09/18/2013  . Preventative health care 01/07/2014  . TIA (transient ischemic attack) 2004  . TIA (transient ischemic attack) 09/18/2013  . UTI (urinary tract infection)     PAST SURGICAL HISTORY: Past Surgical History:  Procedure Laterality Date  . ABDOMINAL HYSTERECTOMY  62 yrs old   still has one ovary  . APPENDECTOMY  62 yrs old  . ASD REPAIR  2006  . hole in heart    . RIGHT OOPHORECTOMY     ruptured ovarian cyst at age 89  . tia  surgery 2005    SOCIAL HISTORY: Social History   Tobacco Use  . Smoking status: Never Smoker  . Smokeless tobacco: Never Used  Substance Use Topics  . Alcohol use: Yes    Comment: occasionally  . Drug use: No    FAMILY HISTORY: Family History  Problem Relation Age of Onset  . Multiple sclerosis Mother   . Other  Mother        blue tumor, air embolism,  . Hypertension Mother   . Obesity Mother   . Diabetes Father 58       type 2  . Other Father        fatty liver, lactose intolerant  . Thyroid disease Father   . Cancer Paternal Grandmother 71       breast  . Diabetes Paternal Grandmother   . Other Sister        s/p hysterectomy  . Breast cancer Maternal Grandmother   . Other Sister        s/p hysterectomy    ROS: Review of Systems  Constitutional: Positive for malaise/fatigue.  HENT: Positive for congestion (nasal stuffiness) and sinus pain.        + Ear Drainage   Eyes:       +  Wear Glasses or Contacts + Floaters  Respiratory: Positive for shortness of breath (on exertion).   Cardiovascular: Negative for chest pain and orthopnea.       + Leg Cramping   Gastrointestinal: Positive for constipation and heartburn.  Musculoskeletal: Positive for back pain.       + Muscle or Joint Pain + Muscle Stiffness    PHYSICAL EXAM: Blood pressure (!) 155/87, pulse 74, height 5\' 1"  (1.549 m), weight 171 lb (77.6 kg), SpO2 99 %. Body mass index is 32.31 kg/m. Physical Exam  Constitutional: She is oriented to person, place, and time. She appears well-developed and well-nourished.  HENT:  Head: Normocephalic and atraumatic.  Nose: Nose normal.  Eyes: EOM are normal. No scleral icterus.  Neck: Normal range of motion. Neck supple. No thyromegaly present.  Cardiovascular: Normal rate and regular rhythm.  Pulmonary/Chest: Effort normal. No respiratory distress.  Abdominal: Soft. There is no tenderness.  + Obesity  Musculoskeletal: Normal range of motion.  Range of Motion normal in all 4 extremities  Neurological: She is alert and oriented to person, place, and time. Coordination normal.  Skin: Skin is warm and dry.  Psychiatric: She has a normal mood and affect. Her behavior is normal.  Vitals reviewed.   RECENT LABS AND TESTS: BMET    Component Value Date/Time   NA 139 08/27/2018  1300   K 4.3 08/27/2018 1300   CL 102 08/27/2018 1300   CO2 23 08/27/2018 1300   GLUCOSE 81 08/27/2018 1300   GLUCOSE 81 01/23/2018 1210   BUN 12 08/27/2018 1300   CREATININE 0.82 08/27/2018 1300   CREATININE 0.88 12/26/2013 1617   CALCIUM 9.3 08/27/2018 1300   GFRNONAA 78 08/27/2018 1300   GFRAA 90 08/27/2018 1300   Lab Results  Component Value Date   HGBA1C 5.9 (H) 08/27/2018   Lab Results  Component Value Date   INSULIN 8.4 08/27/2018   CBC    Component Value Date/Time   WBC 7.5 08/27/2018 1300   WBC 7.0 01/23/2018 1210   RBC 4.77 08/27/2018 1300   RBC 5.03 01/23/2018 1210   HGB 12.8 08/27/2018 1300   HCT 38.9 08/27/2018 1300   PLT 184.0 01/23/2018 1210   MCV 82 08/27/2018 1300   MCH 26.8 08/27/2018 1300   MCH 27.1 12/26/2013 1617   MCHC 32.9 08/27/2018 1300   MCHC 32.4 01/23/2018 1210   RDW 13.1 08/27/2018 1300   LYMPHSABS 2.5 08/27/2018 1300   EOSABS 0.0 08/27/2018 1300   BASOSABS 0.0 08/27/2018 1300   Iron/TIBC/Ferritin/ %Sat No results found for: IRON, TIBC, FERRITIN, IRONPCTSAT Lipid Panel     Component Value Date/Time   CHOL 146 08/27/2018 1300   TRIG 61 08/27/2018 1300   HDL 61 08/27/2018 1300   CHOLHDL 3 01/23/2018 1210   VLDL 12.6 01/23/2018 1210   LDLCALC 73 08/27/2018 1300   Hepatic Function Panel     Component Value Date/Time   PROT 7.1 08/27/2018 1300   ALBUMIN 4.4 08/27/2018 1300   AST 16 08/27/2018 1300   ALT 12 08/27/2018 1300   ALKPHOS 85 08/27/2018 1300   BILITOT 0.3 08/27/2018 1300   BILIDIR 0.1 12/26/2013 1617   IBILI 0.3 12/26/2013 1617      Component Value Date/Time   TSH 0.958 08/27/2018 1300   TSH 1.02 01/23/2018 1210   TSH 0.83 01/19/2017 1141    ECG  shows NSR with a rate of 65 BPM INDIRECT CALORIMETER done today shows a VO2 of 224 and a REE  of 1556.  Her calculated basal metabolic rate is 1610 thus her basal metabolic rate is better than expected.    ASSESSMENT AND PLAN: Other fatigue - Plan: EKG 12-Lead,  Vitamin B12, CBC With Differential, Comprehensive metabolic panel, Folate, Hemoglobin A1c, Insulin, random, Lipid Panel With LDL/HDL Ratio, T3, T4, free, TSH, VITAMIN D 25 Hydroxy (Vit-D Deficiency, Fractures)  Shortness of breath on exertion - Plan: CBC With Differential  Blood pressure elevated without history of HTN  Depression screening  At risk for heart disease  Class 1 obesity with serious comorbidity and body mass index (BMI) of 32.0 to 32.9 in adult, unspecified obesity type  PLAN: Fatigue Brylynn was informed that her fatigue may be related to obesity, depression or many other causes. Labs will be ordered, and in the meanwhile [name] has agreed to work on diet, exercise and weight loss to help with fatigue. Proper sleep hygiene was discussed including the need for 7-8 hours of quality sleep each night. A sleep study was not ordered based on symptoms and Epworth score.  Dyspnea on exertion [name]'s shortness of breath appears to be obesity related and exercise induced. She has agreed to work on weight loss and gradually increase exercise to treat her exercise induced shortness of breath. If [name] follows our instructions and loses weight without improvement of her shortness of breath, we will plan to refer to pulmonology. We will monitor this condition regularly. Tikesha agrees to this plan.  Elevated Blood Pressure without history of hypertension We discussed sodium restriction, working on healthy weight loss, and a regular exercise program as the means to achieve improved blood pressure control. [name] agreed with this plan and agreed to follow up as directed. We will recheck blood pressure in 2 weeks and will continue to monitor her blood pressure as well as her progress with the above lifestyle modifications. She will start diet prescription and we will check labs today. Threasa will watch for signs of hypotension as she continues her lifestyle  modifications.  Cardiovascular risk counseling Mylin was given extended (15 minutes) coronary artery disease prevention counseling today. She is 62 y.o. female and has risk factors for heart disease including obesity and elevated blood pressure. We discussed intensive lifestyle modifications today with an emphasis on specific weight loss instructions and strategies. Pt was also informed of the importance of increasing exercise and decreasing saturated fats to help prevent heart disease.  Depression Screen [name] had a negative depression screening. Depression is commonly associated with obesity and often results in emotional eating behaviors. We will monitor this closely and work on CBT to help improve the non-hunger eating patterns. Referral to Psychology may be required if no improvement is seen as she continues in our clinic.  Obesity Janith is currently in the action stage of change and her goal is to continue with weight loss efforts. I recommend [name] begin the structured treatment plan as follows:  She has agreed to follow the Category 2 plan Avrie has been instructed to eventually work up to a goal of 150 minutes of combined cardio and strengthening exercise per week for weight loss and overall health benefits. We discussed the following Behavioral Modification Strategies today: increasing lean protein intake, decreasing simple carbohydrates  and work on meal planning and easy cooking plans   She was informed of the importance of frequent follow up visits to maximize her success with intensive lifestyle modifications for her multiple health conditions. She was informed we would discuss her lab results at her next  visit unless there is a critical issue that needs to be addressed sooner. [name] agreed to keep her next visit at the agreed upon time to discuss these results.    OBESITY BEHAVIORAL INTERVENTION VISIT  Today's visit was # 1   Starting weight:  171 lbs Starting date: 08/27/18 Today's weight : 171 lbs Today's date: 08/27/2018 Total lbs lost to date: 0   ASK: We discussed the diagnosis of obesity with Montel CulverGeorge-Edna Y Frediani today and [name] agreed to give us permission to discuss obesity behavioral modification therapy today.  ASSESS: [name] has the diagnosis of obesity and her BMI today is 32.33 [name] is in the action stage of change   ADVISE: [name] was educated on the multiple health risks of obesity as well as the benefit of weight loss to improve her health. She was advised of the need for long term treatment and the importance of lifestyle modifications to improve her current health and to decrease her risk of future health problems.  AGREE: Multiple dietary modification options and treatment options were discussed and  [name] agreed to follow the recommendations documented in the above note.  ARRANGE: [name] was educated on the importance of frequent visits to treat obesity as outlined per CMS and USPSTF guidelines and agreed to schedule her next follow up appointment today.  I, [place] CraneJoanne Murray, am acting as transcriptionist for Quillian Quincearen Beasley, MD  I have reviewed the above documentation for accuracy and completeness, and I agree with the above. -Quillian Quincearen Beasley, MD

## 2018-09-10 ENCOUNTER — Ambulatory Visit (INDEPENDENT_AMBULATORY_CARE_PROVIDER_SITE_OTHER): Payer: PRIVATE HEALTH INSURANCE | Admitting: Family Medicine

## 2018-09-10 VITALS — BP 129/80 | HR 77 | Temp 98.0°F | Ht 61.0 in | Wt 168.0 lb

## 2018-09-10 DIAGNOSIS — Z9189 Other specified personal risk factors, not elsewhere classified: Secondary | ICD-10-CM | POA: Diagnosis not present

## 2018-09-10 DIAGNOSIS — R7303 Prediabetes: Secondary | ICD-10-CM | POA: Diagnosis not present

## 2018-09-10 DIAGNOSIS — E559 Vitamin D deficiency, unspecified: Secondary | ICD-10-CM

## 2018-09-10 DIAGNOSIS — Z6831 Body mass index (BMI) 31.0-31.9, adult: Secondary | ICD-10-CM

## 2018-09-10 DIAGNOSIS — E669 Obesity, unspecified: Secondary | ICD-10-CM | POA: Diagnosis not present

## 2018-09-10 MED ORDER — VITAMIN D (ERGOCALCIFEROL) 1.25 MG (50000 UNIT) PO CAPS: 50000.0000 [IU] | ORAL_CAPSULE | ORAL | 0 refills | Status: DC

## 2018-09-11 NOTE — Progress Notes (Signed)
Office: [phone]  /  Fax: [phone]   HPI:   Chief Complaint: OBESITY Jenna Johnson is here to discuss her progress with her obesity treatment plan. She is on the Category 2 plan and is following her eating plan approximately 95 to 98 % of the time. She states she is walking and biking 30 to 45 minutes 3 to 5 times per week. Jenna Johnson did well with weight loss on her Category 2 plan. She states her hunger was controlled and she sometimes struggled to eat all of her dinner. She even journaled her food on my fitness pal on her own.  Her weight is 168 lb (76.2 kg) today and has had a weight loss of 3 pounds over a period of 2 weeks since her last visit. She has lost 3 lbs since starting treatment with [place].  Vitamin D deficiency Jenna Johnson has a new diagnosis of vitamin D deficiency. She is not currently taking vit D or a multivitamin. She admits fatigue and denies nausea, vomiting or muscle weakness.  Pre-Diabetes Jenna Johnson has a new diagnosis of pre-diabetes based on her elevated Hgb A1c and was informed this puts her at greater risk of developing diabetes. She has a normal fasting glucose, but an elevated A1c and Insulin. Her polyphagia has improved on the Category 2 plan. She denies hypoglycemia.  At risk for diabetes Jenna Johnson is at higher than average risk for developing diabetes due to her pre-diabetes and obesity.   ALLERGIES: No Known Allergies  MEDICATIONS: Current Outpatient Medications on File Prior to Visit  Medication Sig Dispense Refill  . acetaminophen (TYLENOL) 650 MG CR tablet Take 650 mg by mouth every 8 (eight) hours as needed for pain.    Marland Kitchen aspirin EC 81 MG tablet Take 1 tablet (81 mg total) by mouth daily.    Marland Kitchen ibuprofen (ADVIL,MOTRIN) 200 MG tablet Take 200 mg by mouth every 6 (six) hours as needed.    . loratadine (CLARITIN) 10 MG tablet Take 10 mg by mouth daily as needed.     . ranitidine (ZANTAC) 300 MG tablet TAKE 1 TABLET(300 MG) BY MOUTH AT  BEDTIME AS NEEDED FOR HEARTBURN 90 tablet 4   No current facility-administered medications on file prior to visit.     PAST MEDICAL HISTORY: Past Medical History:  Diagnosis Date  . Allergic state   . Allergy   . Anemia   . Arthritis of knee, right   . Back pain   . Bursitis of both hips   . Chicken pox as a child  . Constipation   . Dermatitis 09/18/2013  . Endometriosis   . GERD (gastroesophageal reflux disease)   . H/O atrial septal defect   . Hip pain, bilateral 09/18/2013  . Hypertension   . IBS (irritable bowel syndrome)   . Joint pain   . Lactose intolerance   . Leg edema   . Measles as a child  . Multiple food allergies    Sugar  . Myalgia and myositis 09/18/2013  . Preventative health care 01/07/2014  . TIA (transient ischemic attack) 2004  . TIA (transient ischemic attack) 09/18/2013  . UTI (urinary tract infection)     PAST SURGICAL HISTORY: Past Surgical History:  Procedure Laterality Date  . ABDOMINAL HYSTERECTOMY  62 yrs old   still has one ovary  . APPENDECTOMY  62 yrs old  . ASD REPAIR  2006  . hole in heart    . RIGHT OOPHORECTOMY     ruptured ovarian cyst at age  21  . tia  surgery 2005    SOCIAL HISTORY: Social History   Tobacco Use  . Smoking status: Never Smoker  . Smokeless tobacco: Never Used  Substance Use Topics  . Alcohol use: Yes    Comment: occasionally  . Drug use: No    FAMILY HISTORY: Family History  Problem Relation Age of Onset  . Multiple sclerosis Mother   . Other Mother        blue tumor, air embolism,  . Hypertension Mother   . Obesity Mother   . Diabetes Father 65       type 2  . Other Father        fatty liver, lactose intolerant  . Thyroid disease Father   . Cancer Paternal Grandmother 50       breast  . Diabetes Paternal Grandmother   . Other Sister        s/p hysterectomy  . Breast cancer Maternal Grandmother   . Other Sister        s/p hysterectomy    ROS: Review of Systems  Constitutional:  Positive for malaise/fatigue and weight loss.  Endo/Heme/Allergies:       Negative for hypoglycemia.    PHYSICAL EXAM: Blood pressure 129/80, pulse 77, temperature 98 F (36.7 C), temperature source Oral, height 5\' 1"  (1.549 m), weight 168 lb (76.2 kg), SpO2 100 %. Body mass index is 31.74 kg/m. Physical Exam  Constitutional: She is oriented to person, place, and time. She appears well-developed and well-nourished.  Cardiovascular: Normal rate.  Pulmonary/Chest: Effort normal.  Musculoskeletal: Normal range of motion.  Neurological: She is oriented to person, place, and time.  Skin: Skin is warm and dry.  Psychiatric: She has a normal mood and affect. Her behavior is normal.  Vitals reviewed.   RECENT LABS AND TESTS: BMET    Component Value Date/Time   NA 139 08/27/2018 1300   K 4.3 08/27/2018 1300   CL 102 08/27/2018 1300   CO2 23 08/27/2018 1300   GLUCOSE 81 08/27/2018 1300   GLUCOSE 81 01/23/2018 1210   BUN 12 08/27/2018 1300   CREATININE 0.82 08/27/2018 1300   CREATININE 0.88 12/26/2013 1617   CALCIUM 9.3 08/27/2018 1300   GFRNONAA 78 08/27/2018 1300   GFRAA 90 08/27/2018 1300   Lab Results  Component Value Date   HGBA1C 5.9 (H) 08/27/2018   Lab Results  Component Value Date   INSULIN 8.4 08/27/2018   CBC    Component Value Date/Time   WBC 7.5 08/27/2018 1300   WBC 7.0 01/23/2018 1210   RBC 4.77 08/27/2018 1300   RBC 5.03 01/23/2018 1210   HGB 12.8 08/27/2018 1300   HCT 38.9 08/27/2018 1300   PLT 184.0 01/23/2018 1210   MCV 82 08/27/2018 1300   MCH 26.8 08/27/2018 1300   MCH 27.1 12/26/2013 1617   MCHC 32.9 08/27/2018 1300   MCHC 32.4 01/23/2018 1210   RDW 13.1 08/27/2018 1300   LYMPHSABS 2.5 08/27/2018 1300   EOSABS 0.0 08/27/2018 1300   BASOSABS 0.0 08/27/2018 1300   Iron/TIBC/Ferritin/ %Sat No results found for: IRON, TIBC, FERRITIN, IRONPCTSAT Lipid Panel     Component Value Date/Time   CHOL 146 08/27/2018 1300   TRIG 61 08/27/2018 1300     HDL 61 08/27/2018 1300   CHOLHDL 3 01/23/2018 1210   VLDL 12.6 01/23/2018 1210   LDLCALC 73 08/27/2018 1300   Hepatic Function Panel     Component Value Date/Time   PROT 7.1 08/27/2018  1300   ALBUMIN 4.4 08/27/2018 1300   AST 16 08/27/2018 1300   ALT 12 08/27/2018 1300   ALKPHOS 85 08/27/2018 1300   BILITOT 0.3 08/27/2018 1300   BILIDIR 0.1 12/26/2013 1617   IBILI 0.3 12/26/2013 1617      Component Value Date/Time   TSH 0.958 08/27/2018 1300   TSH 1.02 01/23/2018 1210   TSH 0.83 01/19/2017 1141   Results for Jenna, ARONOFF "YVETTE" (MRN [id number]) as of 09/11/2018 15:37  Ref. Range 08/27/2018 13:00  Vitamin D, 25-Hydroxy Latest Ref Range: 30.0 - 100.0 ng/mL 25.4 (L)   ASSESSMENT AND PLAN: Vitamin D deficiency - Plan: Vitamin D, Ergocalciferol, (DRISDOL) 50000 units CAPS capsule  Prediabetes  At risk for diabetes mellitus  Class 1 obesity with serious comorbidity and body mass index (BMI) of 31.0 to 31.9 in adult, unspecified obesity type  PLAN:  Vitamin D Deficiency Jenna Johnson was informed that low vitamin D levels contributes to fatigue and are associated with obesity, breast, and colon cancer. She agrees to start to take prescription Vit D @50 ,000 IU every week #4 with no refills and will follow up for routine testing of vitamin D, at least 2-3 times per year. She was informed of the risk of over-replacement of vitamin D and agrees to not increase her dose unless she discusses this with [place] first. We will recheck labs in 3 months. Jenna Johnson agrees to follow up in 2 weeks.  Pre-Diabetes Jenna Johnson will continue to work on weight loss, exercise, and decreasing simple carbohydrates in her diet to help decrease the risk of diabetes. We discussed metformin including benefits and risks. She was informed that eating too many simple carbohydrates or too many calories at one sitting increases the likelihood of GI side effects. Jenna Johnson deferred metformin for now and  agrees to continue the Category 2 plan and weight loss. Jenna Johnson agreed to follow up with [place] as directed to monitor her progress in 2 weeks.  Diabetes risk counseling Jenna Johnson was given extended (15 minutes) diabetes prevention counseling today. She is 62 y.o. female and has risk factors for diabetes including pre-diabetes and obesity. We discussed intensive lifestyle modifications today with an emphasis on weight loss as well as increasing exercise and decreasing simple carbohydrates in her diet.  Obesity Jenna Johnson is currently in the action stage of change. As such, her goal is to continue with weight loss efforts. She has agreed to follow the Category 2 plan. Jenna Johnson has been instructed to continue walking and biking for 3 to 5 times a week. We discussed the following Behavioral Modification Strategies today: increasing lean protein intake, decreasing simple carbohydrates, and work on meal planning and easy cooking plans  Jenna Johnson has agreed to follow up with our clinic in 2 weeks. She was informed of the importance of frequent follow up visits to maximize her success with intensive lifestyle modifications for her multiple health conditions.   OBESITY BEHAVIORAL INTERVENTION VISIT  Today's visit was # 2   Starting weight: 171 lbs Starting date: 08/27/18 Today's weight : Weight: 168 lb (76.2 kg)  Today's date: 09/10/2018 Total lbs lost to date: 3  ASK: We discussed the diagnosis of obesity with Jenna Johnson today and Jenna Johnson agreed to give [place] permission to discuss obesity behavioral modification therapy today.  ASSESS: Jenna Johnson has the diagnosis of obesity and her BMI today is 31.76. Jenna Johnson is in the action stage of change   ADVISE: Jenna Johnson was educated on the multiple health risks of obesity as well as  the benefit of weight loss to improve her health. She was advised of the need for long term treatment and the importance of lifestyle  modifications to improve her current health and to decrease her risk of future health problems.  AGREE: Multiple dietary modification options and treatment options were discussed and Jenna Johnson agreed to follow the recommendations documented in the above note.  ARRANGE: Jenna Johnson was educated on the importance of frequent visits to treat obesity as outlined per CMS and USPSTF guidelines and agreed to schedule her next follow up appointment today.  I, Kirke Corin, am acting as transcriptionist for Wilder Glade, MD  I have reviewed the above documentation for accuracy and completeness, and I agree with the above. -Quillian Quince, MD

## 2018-09-24 ENCOUNTER — Encounter (INDEPENDENT_AMBULATORY_CARE_PROVIDER_SITE_OTHER): Payer: Self-pay | Admitting: Family Medicine

## 2018-09-24 ENCOUNTER — Ambulatory Visit (INDEPENDENT_AMBULATORY_CARE_PROVIDER_SITE_OTHER): Payer: PRIVATE HEALTH INSURANCE | Admitting: Family Medicine

## 2018-09-24 VITALS — BP 135/76 | HR 75 | Temp 97.9°F | Ht 61.0 in | Wt 167.0 lb

## 2018-09-24 DIAGNOSIS — Z6830 Body mass index (BMI) 30.0-30.9, adult: Secondary | ICD-10-CM

## 2018-09-24 DIAGNOSIS — R7303 Prediabetes: Secondary | ICD-10-CM | POA: Diagnosis not present

## 2018-09-24 DIAGNOSIS — K219 Gastro-esophageal reflux disease without esophagitis: Secondary | ICD-10-CM

## 2018-09-24 DIAGNOSIS — E669 Obesity, unspecified: Secondary | ICD-10-CM | POA: Insufficient documentation

## 2018-09-24 DIAGNOSIS — E66811 Obesity, class 1: Secondary | ICD-10-CM

## 2018-09-24 DIAGNOSIS — Z6832 Body mass index (BMI) 32.0-32.9, adult: Secondary | ICD-10-CM | POA: Insufficient documentation

## 2018-09-24 DIAGNOSIS — E559 Vitamin D deficiency, unspecified: Secondary | ICD-10-CM | POA: Diagnosis not present

## 2018-09-24 DIAGNOSIS — Z6831 Body mass index (BMI) 31.0-31.9, adult: Secondary | ICD-10-CM

## 2018-09-24 DIAGNOSIS — Z9189 Other specified personal risk factors, not elsewhere classified: Secondary | ICD-10-CM

## 2018-09-24 MED ORDER — VITAMIN D (ERGOCALCIFEROL) 1.25 MG (50000 UNIT) PO CAPS: 50000.0000 [IU] | ORAL_CAPSULE | ORAL | 0 refills | Status: DC

## 2018-09-24 MED ORDER — RANITIDINE HCL 300 MG PO TABS: ORAL_TABLET | ORAL | 0 refills | Status: DC

## 2018-09-24 NOTE — Progress Notes (Signed)
Office: [phone]  /  Fax: [phone]   HPI:   Chief Complaint: OBESITY Jenna Johnson is here to discuss her progress with her obesity treatment plan. She is on the  follow the Category 2 plan and is following her eating plan approximately 99 % of the time. She states she is doing doing cardio exercise such as biking, walking, or calisthenics 30 minutes 4 times per week. [name] is doing well with category 2 plan though she is a bit bored with breakfast options. She denies excessive hunger.  Her weight is 167 lb (75.8 kg) today and has had a weight loss of 1 pounds over a period of 2 weeks since her last visit. She has lost 4 lbs since starting treatment with [place].  Vitamin D deficiency Jenna Johnson has a diagnosis of vitamin D deficiency. She is currently taking vit D and denies nausea, vomiting or muscle weakness.  GERD She has a history of GERD but reports it is much improved since starting our meal plan. She has been taking ranitidine nightly. She is out of her ranitidine and needs a refill.  Pre-Diabetes Jenna Johnson has a diagnosis of prediabetes based on her elevated HgA1c of 5.9 and was informed this puts her at greater risk of developing diabetes. She is not taking metformin currently and continues to work on diet and exercise to decrease risk of diabetes. She denies nausea or hypoglycemia.  At risk for diabetes Jenna Johnson is at higher than averagerisk for developing diabetes due to her obesity. She currently denies polyuria or polydipsia.   ALLERGIES: No Known Allergies  MEDICATIONS: Current Outpatient Medications on File Prior to Visit  Medication Sig Dispense Refill  . acetaminophen (TYLENOL) 650 MG CR tablet Take 650 mg by mouth every 8 (eight) hours as needed for pain.    Marland Kitchen aspirin EC 81 MG tablet Take 1 tablet (81 mg total) by mouth daily.    Marland Kitchen ibuprofen (ADVIL,MOTRIN) 200 MG tablet Take 200 mg by mouth every 6 (six) hours as needed.    . loratadine (CLARITIN)  10 MG tablet Take 10 mg by mouth daily as needed.      No current facility-administered medications on file prior to visit.     PAST MEDICAL HISTORY: Past Medical History:  Diagnosis Date  . Allergic state   . Allergy   . Anemia   . Arthritis of knee, right   . Back pain   . Bursitis of both hips   . Chicken pox as a child  . Constipation   . Dermatitis 09/18/2013  . Endometriosis   . GERD (gastroesophageal reflux disease)   . H/O atrial septal defect   . Hip pain, bilateral 09/18/2013  . Hypertension   . IBS (irritable bowel syndrome)   . Joint pain   . Lactose intolerance   . Leg edema   . Measles as a child  . Multiple food allergies    Sugar  . Myalgia and myositis 09/18/2013  . Preventative health care 01/07/2014  . TIA (transient ischemic attack) 2004  . TIA (transient ischemic attack) 09/18/2013  . UTI (urinary tract infection)     PAST SURGICAL HISTORY: Past Surgical History:  Procedure Laterality Date  . ABDOMINAL HYSTERECTOMY  63 yrs old   still has one ovary  . APPENDECTOMY  62 yrs old  . ASD REPAIR  2006  . hole in heart    . RIGHT OOPHORECTOMY     ruptured ovarian cyst at age 7  . tia  surgery  2005    SOCIAL HISTORY: Social History   Tobacco Use  . Smoking status: Never Smoker  . Smokeless tobacco: Never Used  Substance Use Topics  . Alcohol use: Yes    Comment: occasionally  . Drug use: No    FAMILY HISTORY: Family History  Problem Relation Age of Onset  . Multiple sclerosis Mother   . Other Mother        blue tumor, air embolism,  . Hypertension Mother   . Obesity Mother   . Diabetes Father 37       type 2  . Other Father        fatty liver, lactose intolerant  . Thyroid disease Father   . Cancer Paternal Grandmother 30       breast  . Diabetes Paternal Grandmother   . Other Sister        s/p hysterectomy  . Breast cancer Maternal Grandmother   . Other Sister        s/p hysterectomy    ROS: Review of Systems    Constitutional: Positive for weight loss.  Gastrointestinal: Positive for heartburn. Negative for nausea and vomiting.  Genitourinary: Negative for urgency.  Neurological: Negative for weakness.  Endo/Heme/Allergies: Negative for polydipsia.       Negative for polyphagia.    PHYSICAL EXAM: Blood pressure 135/76, pulse 75, temperature 97.9 F (36.6 C), temperature source Oral, height 5\' 1"  (1.549 m), weight 167 lb (75.8 kg), SpO2 97 %. Body mass index is 31.55 kg/m. Physical Exam  Constitutional: She is oriented to person, place, and time. She appears well-developed and well-nourished.  Neck: Normal range of motion.  Cardiovascular: Normal rate.  Pulmonary/Chest: Effort normal.  Musculoskeletal: Normal range of motion.  Neurological: She is alert and oriented to person, place, and time.  Skin: Skin is warm and dry.  Psychiatric: She has a normal mood and affect. Her behavior is normal.    RECENT LABS AND TESTS: BMET    Component Value Date/Time   NA 139 08/27/2018 1300   K 4.3 08/27/2018 1300   CL 102 08/27/2018 1300   CO2 23 08/27/2018 1300   GLUCOSE 81 08/27/2018 1300   GLUCOSE 81 01/23/2018 1210   BUN 12 08/27/2018 1300   CREATININE 0.82 08/27/2018 1300   CREATININE 0.88 12/26/2013 1617   CALCIUM 9.3 08/27/2018 1300   GFRNONAA 78 08/27/2018 1300   GFRAA 90 08/27/2018 1300   Lab Results  Component Value Date   HGBA1C 5.9 (H) 08/27/2018   Lab Results  Component Value Date   INSULIN 8.4 08/27/2018   CBC    Component Value Date/Time   WBC 7.5 08/27/2018 1300   WBC 7.0 01/23/2018 1210   RBC 4.77 08/27/2018 1300   RBC 5.03 01/23/2018 1210   HGB 12.8 08/27/2018 1300   HCT 38.9 08/27/2018 1300   PLT 184.0 01/23/2018 1210   MCV 82 08/27/2018 1300   MCH 26.8 08/27/2018 1300   MCH 27.1 12/26/2013 1617   MCHC 32.9 08/27/2018 1300   MCHC 32.4 01/23/2018 1210   RDW 13.1 08/27/2018 1300   LYMPHSABS 2.5 08/27/2018 1300   EOSABS 0.0 08/27/2018 1300   BASOSABS 0.0  08/27/2018 1300   Iron/TIBC/Ferritin/ %Sat No results found for: IRON, TIBC, FERRITIN, IRONPCTSAT Lipid Panel     Component Value Date/Time   CHOL 146 08/27/2018 1300   TRIG 61 08/27/2018 1300   HDL 61 08/27/2018 1300   CHOLHDL 3 01/23/2018 1210   VLDL 12.6 01/23/2018 1210  LDLCALC 73 08/27/2018 1300   Hepatic Function Panel     Component Value Date/Time   PROT 7.1 08/27/2018 1300   ALBUMIN 4.4 08/27/2018 1300   AST 16 08/27/2018 1300   ALT 12 08/27/2018 1300   ALKPHOS 85 08/27/2018 1300   BILITOT 0.3 08/27/2018 1300   BILIDIR 0.1 12/26/2013 1617   IBILI 0.3 12/26/2013 1617      Component Value Date/Time   TSH 0.958 08/27/2018 1300   TSH 1.02 01/23/2018 1210   TSH 0.83 01/19/2017 1141    ASSESSMENT AND PLAN: Vitamin D deficiency - Plan: Vitamin D, Ergocalciferol, (DRISDOL) 50000 units CAPS capsule  Gastroesophageal reflux disease, esophagitis presence not specified  Prediabetes  At risk for diabetes mellitus  Class 1 obesity with serious comorbidity and body mass index (BMI) of 31.0 to 31.9 in adult, unspecified obesity type  PLAN:  Vitamin D Deficiency Jenna Johnson was informed that low vitamin D levels contributes to fatigue and are associated with obesity, breast, and colon cancer. She agrees to continue to take prescription Vit D @50 ,000 IU every week and will follow up for routine testing of vitamin D, at least 2-3 times per year. Her prescription was refilled today for vit D 50,000 IU weekly #4 with no refills. She was informed of the risk of over-replacement of vitamin D and agrees to not increase her dose unless she discusses this with [place] first.  GERD Refill sent for ranitidine 300 mg PO as needed for heartburn at bedtime. We discussed a trial off of the medication for a few days to see if her heartburn is controlled with diet only and she agreed to this.  Jenna Johnson agreed to follow up with [place] as directed to monitor her progress  Pre-Diabetes Jenna Johnson  will continue to work on weight loss, exercise, and decreasing simple carbohydrates in her diet to help decrease the risk of diabetes.. She was informed that eating too many simple carbohydrates or too many calories at one sitting increases the likelihood of GI side effects. Jenna Johnson declined metformin for now and a prescription was not written today. Jenna Johnson agreed to follow up with [place] as directed to monitor her progress.  Diabetes risk counseling Jenna Johnson was given extended (15 minutes) diabetes prevention counseling today. She is 62 y.o. female and has risk factors for diabetes including obesity. We discussed intensive lifestyle modifications today with an emphasis on weight loss as well as increasing exercise and decreasing simple carbohydrates in her diet.  Obesity Jenna Johnson is currently in the action stage of change. As such, her goal is to continue with weight loss efforts She has agreed to follow the Category 2 plan with alternative breakfast options. Jenna Johnson will continue doing cardio exercise such as biking, walking, or calisthenics 30 minutes 4 times per week. We discussed the following Behavioral Modification Strategies today: increasing lean protein intake and strategies for making healthy choices when dining out.  Jenna Johnson has agreed to follow up with our clinic in 2 weeks. She was informed of the importance of frequent follow up visits to maximize her success with intensive lifestyle modifications for her multiple health conditions.   OBESITY BEHAVIORAL INTERVENTION VISIT  Today's visit was # 3   Starting weight: 171 lbs Starting date: 08/27/18 Today's weight : Weight: 167 lb (75.8 kg)  Today's date: 09/24/2018 Total lbs lost to date: 4   ASK: We discussed the diagnosis of obesity with Jenna Johnson today and Jenna Johnson agreed to give [place] permission to discuss obesity behavioral modification therapy today.  ASSESS: Jenna Johnson has the diagnosis of  obesity and her BMI today is 31.57. Jenna Johnson is in the action stage of change   ADVISE: Jenna Johnson was educated on the multiple health risks of obesity as well as the benefit of weight loss to improve her health. She was advised of the need for long term treatment and the importance of lifestyle modifications to improve her current health and to decrease her risk of future health problems.  AGREE: Multiple dietary modification options and treatment options were discussed and  Jenna Johnson agreed to follow the recommendations documented in the above note.  ARRANGE: Jenna Johnson was educated on the importance of frequent visits to treat obesity as outlined per CMS and USPSTF guidelines and agreed to schedule her next follow up appointment today.

## 2018-10-08 ENCOUNTER — Ambulatory Visit (INDEPENDENT_AMBULATORY_CARE_PROVIDER_SITE_OTHER): Payer: PRIVATE HEALTH INSURANCE | Admitting: Family Medicine

## 2018-10-08 ENCOUNTER — Telehealth: Payer: Self-pay | Admitting: Gastroenterology

## 2018-10-08 ENCOUNTER — Encounter (INDEPENDENT_AMBULATORY_CARE_PROVIDER_SITE_OTHER): Payer: Self-pay | Admitting: Family Medicine

## 2018-10-08 VITALS — BP 146/84 | HR 69 | Temp 98.1°F | Ht 61.0 in | Wt 165.0 lb

## 2018-10-08 DIAGNOSIS — R7303 Prediabetes: Secondary | ICD-10-CM

## 2018-10-08 DIAGNOSIS — E559 Vitamin D deficiency, unspecified: Secondary | ICD-10-CM

## 2018-10-08 DIAGNOSIS — I10 Essential (primary) hypertension: Secondary | ICD-10-CM | POA: Diagnosis not present

## 2018-10-08 DIAGNOSIS — K219 Gastro-esophageal reflux disease without esophagitis: Secondary | ICD-10-CM | POA: Diagnosis not present

## 2018-10-08 DIAGNOSIS — Z6831 Body mass index (BMI) 31.0-31.9, adult: Secondary | ICD-10-CM

## 2018-10-08 DIAGNOSIS — E669 Obesity, unspecified: Secondary | ICD-10-CM

## 2018-10-08 NOTE — Progress Notes (Signed)
Office: [phone]  /  Fax: [phone]   HPI:   Chief Complaint: OBESITY Jenna Johnson is here to discuss her progress with her obesity treatment plan. She is on the  follow the Category 2 plan and is following her eating plan approximately 100% of the time. She states she is cycling or walking for 45 minutes 3 times per week. Jenna Johnson is currently struggling with eating all of the prescribed food in daily.  Her weight is 165 lb (74.8 kg) today and has had a weight loss of 2 pounds over a period of 2 weeks since her last visit. She has lost 6 lbs since starting treatment with [place].  Vitamin D deficiency Jenna Johnson has a diagnosis of vitamin D deficiency. She is currently taking vit D and denies nausea, vomiting or muscle weakness.  GERD She has noticed decreased heartburn and reflux since starting the meal plan. She did not take a trial off of the ranitidine as discussed at her last visit but she would like to.   Pre-Diabetes Jenna Johnson has a diagnosis of prediabetes based on her elevated HgA1c and was informed this puts her at greater risk of developing diabetes. She is not taking metformin currently and continues to work on diet and exercise to decrease risk of diabetes. She polyphagia. Lab Results  Component Value Date   HGBA1C 5.9 (H) 08/27/2018    Hypertension Jenna Johnson is a 62 y.o. female with hypertension.  Jenna Johnson denies chest pain or shortness of breath on exertion. She is working weight loss to help control her blood pressure with the goal of decreasing her risk of heart attack and stroke. Jenna Johnson blood pressure is not well controlled today. She feels the elevated blood pressure is related to high sodium intake yesterday when she ate [place] food.  Her BP at her last 2 visits was < 140/90. She does not take an antihypertensive. I asked her to monitor her blood pressure at home 2-3 times per week and bring the readings to her next appointment.     ALLERGIES: No Known Allergies  MEDICATIONS: Current Outpatient Medications on File Prior to Visit  Medication Sig Dispense Refill  . acetaminophen (TYLENOL) 650 MG CR tablet Take 650 mg by mouth every 8 (eight) hours as needed for pain.    Marland Kitchen aspirin EC 81 MG tablet Take 1 tablet (81 mg total) by mouth daily.    Marland Kitchen ibuprofen (ADVIL,MOTRIN) 200 MG tablet Take 200 mg by mouth every 6 (six) hours as needed.    . loratadine (CLARITIN) 10 MG tablet Take 10 mg by mouth daily as needed.     . ranitidine (ZANTAC) 300 MG tablet TAKE 1 TABLET(300 MG) BY MOUTH AT BEDTIME AS NEEDED FOR HEARTBURN 90 tablet 0  . Vitamin D, Ergocalciferol, (DRISDOL) 50000 units CAPS capsule Take 1 capsule (50,000 Units total) by mouth every 7 (seven) days. 4 capsule 0   No current facility-administered medications on file prior to visit.     PAST MEDICAL HISTORY: Past Medical History:  Diagnosis Date  . Allergic state   . Allergy   . Anemia   . Arthritis of knee, right   . Back pain   . Bursitis of both hips   . Chicken pox as a child  . Constipation   . Dermatitis 09/18/2013  . Endometriosis   . GERD (gastroesophageal reflux disease)   . H/O atrial septal defect   . Hip pain, bilateral 09/18/2013  . Hypertension   . IBS (irritable  bowel syndrome)   . Joint pain   . Lactose intolerance   . Leg edema   . Measles as a child  . Multiple food allergies    Sugar  . Myalgia and myositis 09/18/2013  . Preventative health care 01/07/2014  . TIA (transient ischemic attack) 2004  . TIA (transient ischemic attack) 09/18/2013  . UTI (urinary tract infection)     PAST SURGICAL HISTORY: Past Surgical History:  Procedure Laterality Date  . ABDOMINAL HYSTERECTOMY  62 yrs old   still has one ovary  . APPENDECTOMY  62 yrs old  . ASD REPAIR  2006  . hole in heart    . RIGHT OOPHORECTOMY     ruptured ovarian cyst at age 23  . tia  surgery 2005    SOCIAL HISTORY: Social History   Tobacco Use  . Smoking  status: Never Smoker  . Smokeless tobacco: Never Used  Substance Use Topics  . Alcohol use: Yes    Comment: occasionally  . Drug use: No    FAMILY HISTORY: Family History  Problem Relation Age of Onset  . Multiple sclerosis Mother   . Other Mother        blue tumor, air embolism,  . Hypertension Mother   . Obesity Mother   . Diabetes Father 106       type 2  . Other Father        fatty liver, lactose intolerant  . Thyroid disease Father   . Cancer Paternal Grandmother 82       breast  . Diabetes Paternal Grandmother   . Other Sister        s/p hysterectomy  . Breast cancer Maternal Grandmother   . Other Sister        s/p hysterectomy    ROS: Review of Systems  Constitutional: Positive for weight loss.  Respiratory: Negative for shortness of breath.   Cardiovascular: Negative for chest pain.  Gastrointestinal: Negative for nausea and vomiting.  Neurological: Negative for weakness.  Endo/Heme/Allergies:       Negative for polyphagia.    PHYSICAL EXAM: Blood pressure (!) 146/84, pulse 69, temperature 98.1 F (36.7 C), temperature source Oral, height 5\' 1"  (1.549 m), weight 165 lb (74.8 kg), SpO2 98 %. Body mass index is 31.18 kg/m. Physical Exam  Constitutional: She is oriented to person, place, and time. She appears well-developed and well-nourished.  Cardiovascular: Normal rate.  Pulmonary/Chest: Effort normal.  Musculoskeletal: Normal range of motion.  Neurological: She is alert and oriented to person, place, and time.  Skin: Skin is warm and dry.  Psychiatric: She has a normal mood and affect. Her behavior is normal.  Vitals reviewed.   RECENT LABS AND TESTS: BMET    Component Value Date/Time   NA 139 08/27/2018 1300   K 4.3 08/27/2018 1300   CL 102 08/27/2018 1300   CO2 23 08/27/2018 1300   GLUCOSE 81 08/27/2018 1300   GLUCOSE 81 01/23/2018 1210   BUN 12 08/27/2018 1300   CREATININE 0.82 08/27/2018 1300   CREATININE 0.88 12/26/2013 1617    CALCIUM 9.3 08/27/2018 1300   GFRNONAA 78 08/27/2018 1300   GFRAA 90 08/27/2018 1300   Lab Results  Component Value Date   HGBA1C 5.9 (H) 08/27/2018   Lab Results  Component Value Date   INSULIN 8.4 08/27/2018   CBC    Component Value Date/Time   WBC 7.5 08/27/2018 1300   WBC 7.0 01/23/2018 1210   RBC 4.77 08/27/2018 1300  RBC 5.03 01/23/2018 1210   HGB 12.8 08/27/2018 1300   HCT 38.9 08/27/2018 1300   PLT 184.0 01/23/2018 1210   MCV 82 08/27/2018 1300   MCH 26.8 08/27/2018 1300   MCH 27.1 12/26/2013 1617   MCHC 32.9 08/27/2018 1300   MCHC 32.4 01/23/2018 1210   RDW 13.1 08/27/2018 1300   LYMPHSABS 2.5 08/27/2018 1300   EOSABS 0.0 08/27/2018 1300   BASOSABS 0.0 08/27/2018 1300   Iron/TIBC/Ferritin/ %Sat No results found for: IRON, TIBC, FERRITIN, IRONPCTSAT Lipid Panel     Component Value Date/Time   CHOL 146 08/27/2018 1300   TRIG 61 08/27/2018 1300   HDL 61 08/27/2018 1300   CHOLHDL 3 01/23/2018 1210   VLDL 12.6 01/23/2018 1210   LDLCALC 73 08/27/2018 1300   Hepatic Function Panel     Component Value Date/Time   PROT 7.1 08/27/2018 1300   ALBUMIN 4.4 08/27/2018 1300   AST 16 08/27/2018 1300   ALT 12 08/27/2018 1300   ALKPHOS 85 08/27/2018 1300   BILITOT 0.3 08/27/2018 1300   BILIDIR 0.1 12/26/2013 1617   IBILI 0.3 12/26/2013 1617      Component Value Date/Time   TSH 0.958 08/27/2018 1300   TSH 1.02 01/23/2018 1210   TSH 0.83 01/19/2017 1141  Results for Jenna Johnson, Jenna Johnson "Jenna Johnson" (MRN [id number]) as of 10/08/2018 15:19  Ref. Range 08/27/2018 13:00  Vitamin D, 25-Hydroxy Latest Ref Range: 30.0 - 100.0 ng/mL 25.4 (L)    ASSESSMENT AND PLAN: Vitamin D deficiency  Gastroesophageal reflux disease, esophagitis presence not specified  Prediabetes  Essential hypertension  Class 1 obesity with serious comorbidity and body mass index (BMI) of 31.0 to 31.9 in adult, unspecified obesity type  PLAN:  Vitamin D Deficiency Jenna Johnson was informed  that low vitamin D levels contributes to fatigue and are associated with obesity, breast, and colon cancer. She agrees to continue to take prescription Vit D @50 ,000 IU every week and will follow up for routine testing of vitamin D, at least 2-3 times per year. No prescription was sent today.  She was informed of the risk of over-replacement of vitamin D and agrees to not increase her dose unless she discusses this with [place] first.  GERD She will take a trial off of her ranitidine for a few days to see if she still needs the medication. If she does not experience heartburn or reflux, she will only take the ranitidine as needed rather than daily.   Pre-Diabetes Jenna Johnson will continue to work on weight loss, exercise, and decreasing simple carbohydrates in her diet to help decrease the risk of diabetes. She declines metformin at this time.  Jenna Johnson agreed to follow up with [place] as directed to monitor her progress.  Hypertension We discussed sodium restriction, working on healthy weight loss, and a regular exercise program as the means to achieve improved blood pressure control. Jenna Johnson agreed with this plan and agreed to follow up as directed. We will continue to monitor her blood pressure as well as her progress with the above lifestyle modifications.  We will defer medication at this time and continue to monitor her blood pressure.   Obesity Jenna Johnson is currently in the action stage of change. As such, her goal is to continue with weight loss efforts She has agreed to follow the Category 2 plan Jenna Johnson has been instructed to continue cycling or walking for 45 minutes 3 times per week. We discussed the following Behavioral Modification Strategies today: work on meal planning and easy  cooking plans, increase water intake, and planning for success.  Jenna Johnson has agreed to follow up with our clinic in 2 weeks. She was informed of the importance of frequent follow up visits to maximize  her success with intensive lifestyle modifications for her multiple health conditions.  I spent > than 50% of the 15 minute visit on counseling as documented in the note.  OBESITY BEHAVIORAL INTERVENTION VISIT  Today's visit was # 4  Starting weight: 171 lbs Starting date: 08/27/18 Today's weight : Weight: 165 lb (74.8 kg)  Today's date: 10/08/2018 Total lbs lost to date: 6  ASK: We discussed the diagnosis of obesity with Jenna Johnson today and Jenna Johnson agreed to give [place] permission to discuss obesity behavioral modification therapy today.  ASSESS: Jenna Johnson has the diagnosis of obesity and her BMI today is 31.2. Jenna Johnson is in the action stage of change   ADVISE: Jenna Johnson was educated on the multiple health risks of obesity as well as the benefit of weight loss to improve her health. She was advised of the need for long term treatment and the importance of lifestyle modifications to improve her current health and to decrease her risk of future health problems.  AGREE: Multiple dietary modification options and treatment options were discussed and  Jenna Johnson agreed to follow the recommendations documented in the above note.  ARRANGE: Jenna Johnson was educated on the importance of frequent visits to treat obesity as outlined per CMS and USPSTF guidelines and agreed to schedule her next follow up appointment today.

## 2018-10-08 NOTE — Telephone Encounter (Signed)
ROI fax to [facility] for records.

## 2018-10-22 ENCOUNTER — Ambulatory Visit (INDEPENDENT_AMBULATORY_CARE_PROVIDER_SITE_OTHER): Payer: PRIVATE HEALTH INSURANCE | Admitting: Family Medicine

## 2018-10-22 VITALS — BP 136/80 | HR 65 | Temp 98.0°F | Ht 61.0 in | Wt 165.0 lb

## 2018-10-22 DIAGNOSIS — Z6831 Body mass index (BMI) 31.0-31.9, adult: Secondary | ICD-10-CM

## 2018-10-22 DIAGNOSIS — R7303 Prediabetes: Secondary | ICD-10-CM

## 2018-10-22 DIAGNOSIS — E669 Obesity, unspecified: Secondary | ICD-10-CM

## 2018-10-22 DIAGNOSIS — E559 Vitamin D deficiency, unspecified: Secondary | ICD-10-CM | POA: Diagnosis not present

## 2018-10-22 NOTE — Telephone Encounter (Signed)
[facility] to Dr. Glynis SmilesNandigam Kanitha V

## 2018-10-24 ENCOUNTER — Ambulatory Visit (INDEPENDENT_AMBULATORY_CARE_PROVIDER_SITE_OTHER): Payer: PRIVATE HEALTH INSURANCE | Admitting: Family Medicine

## 2018-10-25 ENCOUNTER — Encounter (INDEPENDENT_AMBULATORY_CARE_PROVIDER_SITE_OTHER): Payer: Self-pay | Admitting: Family Medicine

## 2018-10-25 NOTE — Progress Notes (Signed)
Office: [phone]  /  Fax: [phone]   HPI:   Chief Complaint: OBESITY Jenna Johnson is here to discuss her progress with her obesity treatment plan. She is on the Category 2 plan and is following her eating plan approximately 90 to 95% of the time. She states she is walking, biking and doing calisthenics 15 to 30 minutes 4 times per week. Jenna Johnson is eating all of the food on the Category 2 plan. She is also journaling using [employer]itness[employer]. Her weight is 165 lb (74.8 kg) today and she has maintained weight over a period of 2 weeks since her last visit. She has lost 6 lbs since starting treatment with [place].  Pre-Diabetes Jenna Johnson has a diagnosis of prediabetes based on her elevated Hgb A1c and was informed this puts her at greater risk of developing diabetes. Her last A1c was at 5.9 She is not taking metformin currently and continues to work on diet and exercise to decrease risk of diabetes. She denies polyphagia or hypoglycemia.  Vitamin D deficiency Jenna Johnson has a diagnosis of vitamin D deficiency. She is stable on vit D, but she is not yet at goal. Jenna Johnson denies nausea, vomiting or muscle weakness.  ALLERGIES: No Known Allergies  MEDICATIONS: Current Outpatient Medications on File Prior to Visit  Medication Sig Dispense Refill  . acetaminophen (TYLENOL) 650 MG CR tablet Take 650 mg by mouth every 8 (eight) hours as needed for pain.    Marland Kitchen aspirin EC 81 MG tablet Take 1 tablet (81 mg total) by mouth daily.    Marland Kitchen ibuprofen (ADVIL,MOTRIN) 200 MG tablet Take 200 mg by mouth every 6 (six) hours as needed.    . loratadine (CLARITIN) 10 MG tablet Take 10 mg by mouth daily as needed.     . ranitidine (ZANTAC) 300 MG tablet TAKE 1 TABLET(300 MG) BY MOUTH AT BEDTIME AS NEEDED FOR HEARTBURN 90 tablet 0  . Vitamin D, Ergocalciferol, (DRISDOL) 50000 units CAPS capsule Take 1 capsule (50,000 Units total) by mouth every 7 (seven) days. 4 capsule 0   No current facility-administered  medications on file prior to visit.     PAST MEDICAL HISTORY: Past Medical History:  Diagnosis Date  . Allergic state   . Allergy   . Anemia   . Arthritis of knee, right   . Back pain   . Bursitis of both hips   . Chicken pox as a child  . Constipation   . Dermatitis 09/18/2013  . Endometriosis   . GERD (gastroesophageal reflux disease)   . H/O atrial septal defect   . Hip pain, bilateral 09/18/2013  . Hypertension   . IBS (irritable bowel syndrome)   . Joint pain   . Lactose intolerance   . Leg edema   . Measles as a child  . Multiple food allergies    Sugar  . Myalgia and myositis 09/18/2013  . Preventative health care 01/07/2014  . TIA (transient ischemic attack) 2004  . TIA (transient ischemic attack) 09/18/2013  . UTI (urinary tract infection)     PAST SURGICAL HISTORY: Past Surgical History:  Procedure Laterality Date  . ABDOMINAL HYSTERECTOMY  62 yrs old   still has one ovary  . APPENDECTOMY  62 yrs old  . ASD REPAIR  2006  . hole in heart    . RIGHT OOPHORECTOMY     ruptured ovarian cyst at age 33  . tia  surgery 2005    SOCIAL HISTORY: Social History   Tobacco Use  .  Smoking status: Never Smoker  . Smokeless tobacco: Never Used  Substance Use Topics  . Alcohol use: Yes    Comment: occasionally  . Drug use: No    FAMILY HISTORY: Family History  Problem Relation Age of Onset  . Multiple sclerosis Mother   . Other Mother        blue tumor, air embolism,  . Hypertension Mother   . Obesity Mother   . Diabetes Father 14       type 2  . Other Father        fatty liver, lactose intolerant  . Thyroid disease Father   . Cancer Paternal Grandmother 19       breast  . Diabetes Paternal Grandmother   . Other Sister        s/p hysterectomy  . Breast cancer Maternal Grandmother   . Other Sister        s/p hysterectomy    ROS: Review of Systems  Constitutional: Negative for weight loss.  Gastrointestinal: Negative for nausea and vomiting.    Musculoskeletal:       Negative for muscle weakness  Endo/Heme/Allergies:       Negative for polyphagia Negative for hypoglycemia    PHYSICAL EXAM: Blood pressure 136/80, pulse 65, temperature 98 F (36.7 C), temperature source Oral, height 5\' 1"  (1.549 m), weight 165 lb (74.8 kg), SpO2 96 %. Body mass index is 31.18 kg/m. Physical Exam  Constitutional: She is oriented to person, place, and time. She appears well-developed and well-nourished.  Cardiovascular: Normal rate.  Pulmonary/Chest: Effort normal.  Musculoskeletal: Normal range of motion.  Neurological: She is oriented to person, place, and time.  Skin: Skin is warm and dry.  Psychiatric: She has a normal mood and affect. Her behavior is normal.  Vitals reviewed.   RECENT LABS AND TESTS: BMET    Component Value Date/Time   NA 139 08/27/2018 1300   K 4.3 08/27/2018 1300   CL 102 08/27/2018 1300   CO2 23 08/27/2018 1300   GLUCOSE 81 08/27/2018 1300   GLUCOSE 81 01/23/2018 1210   BUN 12 08/27/2018 1300   CREATININE 0.82 08/27/2018 1300   CREATININE 0.88 12/26/2013 1617   CALCIUM 9.3 08/27/2018 1300   GFRNONAA 78 08/27/2018 1300   GFRAA 90 08/27/2018 1300   Lab Results  Component Value Date   HGBA1C 5.9 (H) 08/27/2018   Lab Results  Component Value Date   INSULIN 8.4 08/27/2018   CBC    Component Value Date/Time   WBC 7.5 08/27/2018 1300   WBC 7.0 01/23/2018 1210   RBC 4.77 08/27/2018 1300   RBC 5.03 01/23/2018 1210   HGB 12.8 08/27/2018 1300   HCT 38.9 08/27/2018 1300   PLT 184.0 01/23/2018 1210   MCV 82 08/27/2018 1300   MCH 26.8 08/27/2018 1300   MCH 27.1 12/26/2013 1617   MCHC 32.9 08/27/2018 1300   MCHC 32.4 01/23/2018 1210   RDW 13.1 08/27/2018 1300   LYMPHSABS 2.5 08/27/2018 1300   EOSABS 0.0 08/27/2018 1300   BASOSABS 0.0 08/27/2018 1300   Iron/TIBC/Ferritin/ %Sat No results found for: IRON, TIBC, FERRITIN, IRONPCTSAT Lipid Panel     Component Value Date/Time   CHOL 146 08/27/2018  1300   TRIG 61 08/27/2018 1300   HDL 61 08/27/2018 1300   CHOLHDL 3 01/23/2018 1210   VLDL 12.6 01/23/2018 1210   LDLCALC 73 08/27/2018 1300   Hepatic Function Panel     Component Value Date/Time   PROT 7.1 08/27/2018  1300   ALBUMIN 4.4 08/27/2018 1300   AST 16 08/27/2018 1300   ALT 12 08/27/2018 1300   ALKPHOS 85 08/27/2018 1300   BILITOT 0.3 08/27/2018 1300   BILIDIR 0.1 12/26/2013 1617   IBILI 0.3 12/26/2013 1617      Component Value Date/Time   TSH 0.958 08/27/2018 1300   TSH 1.02 01/23/2018 1210   TSH 0.83 01/19/2017 1141   Results for Montel CulverMORROW, Jenna Johnson Y "YVETTE" (MRN [id number]) as of 10/25/2018 08:05  Ref. Range 08/27/2018 13:00  Vitamin D, 25-Hydroxy Latest Ref Range: 30.0 - 100.0 ng/mL 25.4 (L)   ASSESSMENT AND PLAN: Prediabetes  Vitamin D deficiency  Class 1 obesity with serious comorbidity and body mass index (BMI) of 31.0 to 31.9 in adult, unspecified obesity type  PLAN:  Pre-Diabetes Jenna Johnson will continue to work on weight loss, exercise, and decreasing simple carbohydrates in her diet to help decrease the risk of diabetes. She was informed that eating too many simple carbohydrates or too many calories at one sitting increases the likelihood of GI side effects. Jenna Johnson agreed to follow up with us as directed to monitor her progress.  Vitamin D Deficiency Jenna Johnson was informed that low vitamin D levels contributes to fatigue and are associated with obesity, breast, and colon cancer. She agrees to continue to take prescription Vit D @50 ,000 IU every week and will follow up for routine testing of vitamin D, at least 2-3 times per year. She was informed of the risk of over-replacement of vitamin D and agrees to not increase her dose unless she discusses this with us first.  I spent > than 50% of the 15 minute visit on counseling as documented in the note.  Obesity Jenna Johnson is currently in the action stage of change. As such, her goal is to  continue with weight loss efforts She has agreed to follow the Category 2 plan Jenna Johnson has been instructed to continue walking, biking and doing calisthenics 15 to 30 minutes 4 times per week for weight loss and overall health benefits. We discussed the following Behavioral Modification Strategies today: increase H2O intake, planning for success, travel eating strategies and holiday eating strategies   Jenna Johnson has agreed to follow up with our clinic in 2 weeks. She was informed of the importance of frequent follow up visits to maximize her success with intensive lifestyle modifications for her multiple health conditions.   OBESITY BEHAVIORAL INTERVENTION VISIT  Today's visit was # 5  Starting weight: 171 lbs Starting date: 08/27/2018 Today's weight :  165 lbs Today's date: 10/22/2018 Total lbs lost to date: 6   ASK: We discussed the diagnosis of obesity with Montel CulverGeorge-Edna Y Belair today and Jenna Johnson agreed to give us permission to discuss obesity behavioral modification therapy today.  ASSESS: Jenna Johnson has the diagnosis of obesity and her BMI today is 31.2 Jenna Johnson is in the action stage of change   ADVISE: Jenna Johnson was educated on the multiple health risks of obesity as well as the benefit of weight loss to improve her health. She was advised of the need for long term treatment and the importance of lifestyle modifications to improve her current health and to decrease her risk of future health problems.  AGREE: Multiple dietary modification options and treatment options were discussed and  Jenna Johnson agreed to follow the recommendations documented in the above note.  ARRANGE: Jenna Johnson was educated on the importance of frequent visits to treat obesity as outlined per CMS and USPSTF guidelines and agreed to schedule her next follow  up appointment today.  Cristi Loron, am acting as Energy manager for [place], FNP-C.  I have reviewed the above  documentation for accuracy and completeness, and I agree with the above.  - Duwan Adrian, FNP-C.

## 2018-11-05 ENCOUNTER — Ambulatory Visit (INDEPENDENT_AMBULATORY_CARE_PROVIDER_SITE_OTHER): Payer: PRIVATE HEALTH INSURANCE | Admitting: Family Medicine

## 2018-11-05 VITALS — BP 146/74 | HR 64 | Temp 97.9°F | Ht 61.0 in | Wt 162.0 lb

## 2018-11-05 DIAGNOSIS — R030 Elevated blood-pressure reading, without diagnosis of hypertension: Secondary | ICD-10-CM

## 2018-11-05 DIAGNOSIS — E669 Obesity, unspecified: Secondary | ICD-10-CM

## 2018-11-05 DIAGNOSIS — E559 Vitamin D deficiency, unspecified: Secondary | ICD-10-CM | POA: Diagnosis not present

## 2018-11-05 DIAGNOSIS — Z9189 Other specified personal risk factors, not elsewhere classified: Secondary | ICD-10-CM

## 2018-11-05 DIAGNOSIS — Z6830 Body mass index (BMI) 30.0-30.9, adult: Secondary | ICD-10-CM

## 2018-11-05 DIAGNOSIS — E66811 Obesity, class 1: Secondary | ICD-10-CM

## 2018-11-05 MED ORDER — VITAMIN D (ERGOCALCIFEROL) 1.25 MG (50000 UNIT) PO CAPS: 50000.0000 [IU] | ORAL_CAPSULE | ORAL | 0 refills | Status: DC

## 2018-11-07 NOTE — Progress Notes (Signed)
Office: [phone]  /  Fax: [phone]   HPI:   Chief Complaint: OBESITY Jenna Johnson is here to discuss her progress with her obesity treatment plan. She is following the Category 2 plan and is following her eating plan approximately 95 % of the time. She states she is swimming, cycling and walking 35-50 minutes 3-4 times per week. Jenna Johnson is doing well on Category 2. Her husband is very supportive. He has attended all of her visits to our clinic and does the cooking at home.   Her weight is 162 lb (73.5 kg) today and has had a weight loss of 3 pounds over a period of 2 weeks since her last visit. She has lost 9 lbs since starting treatment with [place].  Vitamin D deficiency Jenna Johnson has a diagnosis of vitamin D deficiency. She is currently taking vit D and denies nausea, vomiting or muscle weakness.  At risk for osteopenia and osteoporosis Jenna Johnson is at higher risk of osteopenia and osteoporosis due to vitamin D deficiency.   Elevated BP without diagnosis of HTN Jenna Johnson has an elevated BP without previous diagnosis of HTN. She denies any shortness of breath, chest pain or headache. 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 We discussed that we may consider adding an antihypertensive medication if her blood pressure readings are elevated.    ALLERGIES: No Known Allergies  MEDICATIONS: Current Outpatient Medications on File Prior to Visit  Medication Sig Dispense Refill  . acetaminophen (TYLENOL) 650 MG CR tablet Take 650 mg by mouth every 8 (eight) hours as needed for pain.    Marland Kitchen aspirin EC 81 MG tablet Take 1 tablet (81 mg total) by mouth daily.    Marland Kitchen ibuprofen (ADVIL,MOTRIN) 200 MG tablet Take 200 mg by mouth every 6 (six) hours as needed.    . loratadine (CLARITIN) 10 MG tablet Take 10 mg by mouth daily as needed.     . ranitidine (ZANTAC) 300 MG tablet TAKE 1 TABLET(300 MG) BY MOUTH AT BEDTIME AS NEEDED FOR HEARTBURN 90 tablet 0   No current  facility-administered medications on file prior to visit.     PAST MEDICAL HISTORY: Past Medical History:  Diagnosis Date  . Allergic state   . Allergy   . Anemia   . Arthritis of knee, right   . Back pain   . Bursitis of both hips   . Chicken pox as a child  . Constipation   . Dermatitis 09/18/2013  . Endometriosis   . GERD (gastroesophageal reflux disease)   . H/O atrial septal defect   . Hip pain, bilateral 09/18/2013  . Hypertension   . IBS (irritable bowel syndrome)   . Joint pain   . Lactose intolerance   . Leg edema   . Measles as a child  . Multiple food allergies    Sugar  . Myalgia and myositis 09/18/2013  . Preventative health care 01/07/2014  . TIA (transient ischemic attack) 2004  . TIA (transient ischemic attack) 09/18/2013  . UTI (urinary tract infection)     PAST SURGICAL HISTORY: Past Surgical History:  Procedure Laterality Date  . ABDOMINAL HYSTERECTOMY  62 yrs old   still has one ovary  . APPENDECTOMY  62 yrs old  . ASD REPAIR  2006  . hole in heart    . RIGHT OOPHORECTOMY     ruptured ovarian cyst at age 76  . tia  surgery 2005    SOCIAL HISTORY: Social History  Tobacco Use  . Smoking status: Never Smoker  . Smokeless tobacco: Never Used  Substance Use Topics  . Alcohol use: Yes    Comment: occasionally  . Drug use: No    FAMILY HISTORY: Family History  Problem Relation Age of Onset  . Multiple sclerosis Mother   . Other Mother        blue tumor, air embolism,  . Hypertension Mother   . Obesity Mother   . Diabetes Father 20       type 2  . Other Father        fatty liver, lactose intolerant  . Thyroid disease Father   . Cancer Paternal Grandmother 58       breast  . Diabetes Paternal Grandmother   . Other Sister        s/p hysterectomy  . Breast cancer Maternal Grandmother   . Other Sister        s/p hysterectomy    ROS: Review of Systems  Constitutional: Positive for malaise/fatigue and weight loss.  Respiratory:  Negative for shortness of breath.   Cardiovascular: Negative for chest pain.  Gastrointestinal: Negative for nausea and vomiting.  Musculoskeletal:       Negative for muscle weakness  Neurological: Negative for headaches.    PHYSICAL EXAM: Blood pressure (!) 146/74, pulse 64, temperature 97.9 F (36.6 C), temperature source Oral, height 5\' 1"  (1.549 m), weight 162 lb (73.5 kg), SpO2 98 %. Body mass index is 30.61 kg/m. Physical Exam  Constitutional: She is oriented to person, place, and time. She appears well-developed and well-nourished.  Cardiovascular: Normal rate.  Pulmonary/Chest: Effort normal.  Musculoskeletal: Normal range of motion.  Neurological: She is alert and oriented to person, place, and time.  Skin: Skin is warm and dry.  Psychiatric: She has a normal mood and affect. Her behavior is normal.  Vitals reviewed.   RECENT LABS AND TESTS: BMET    Component Value Date/Time   NA 139 08/27/2018 1300   K 4.3 08/27/2018 1300   CL 102 08/27/2018 1300   CO2 23 08/27/2018 1300   GLUCOSE 81 08/27/2018 1300   GLUCOSE 81 01/23/2018 1210   BUN 12 08/27/2018 1300   CREATININE 0.82 08/27/2018 1300   CREATININE 0.88 12/26/2013 1617   CALCIUM 9.3 08/27/2018 1300   GFRNONAA 78 08/27/2018 1300   GFRAA 90 08/27/2018 1300   Lab Results  Component Value Date   HGBA1C 5.9 (H) 08/27/2018   Lab Results  Component Value Date   INSULIN 8.4 08/27/2018   CBC    Component Value Date/Time   WBC 7.5 08/27/2018 1300   WBC 7.0 01/23/2018 1210   RBC 4.77 08/27/2018 1300   RBC 5.03 01/23/2018 1210   HGB 12.8 08/27/2018 1300   HCT 38.9 08/27/2018 1300   PLT 184.0 01/23/2018 1210   MCV 82 08/27/2018 1300   MCH 26.8 08/27/2018 1300   MCH 27.1 12/26/2013 1617   MCHC 32.9 08/27/2018 1300   MCHC 32.4 01/23/2018 1210   RDW 13.1 08/27/2018 1300   LYMPHSABS 2.5 08/27/2018 1300   EOSABS 0.0 08/27/2018 1300   BASOSABS 0.0 08/27/2018 1300   Iron/TIBC/Ferritin/ %Sat No results found  for: IRON, TIBC, FERRITIN, IRONPCTSAT Lipid Panel     Component Value Date/Time   CHOL 146 08/27/2018 1300   TRIG 61 08/27/2018 1300   HDL 61 08/27/2018 1300   CHOLHDL 3 01/23/2018 1210   VLDL 12.6 01/23/2018 1210   LDLCALC 73 08/27/2018 1300   Hepatic Function  Panel     Component Value Date/Time   PROT 7.1 08/27/2018 1300   ALBUMIN 4.4 08/27/2018 1300   AST 16 08/27/2018 1300   ALT 12 08/27/2018 1300   ALKPHOS 85 08/27/2018 1300   BILITOT 0.3 08/27/2018 1300   BILIDIR 0.1 12/26/2013 1617   IBILI 0.3 12/26/2013 1617      Component Value Date/Time   TSH 0.958 08/27/2018 1300   TSH 1.02 01/23/2018 1210   TSH 0.83 01/19/2017 1141  Results for Jenna CulverMORROW, Jenna Johnson Y "YVETTE" (MRN [id number]) as of 11/07/2018 16:26  Ref. Range 08/27/2018 13:00  Vitamin D, 25-Hydroxy Latest Ref Range: 30.0 - 100.0 ng/mL 25.4 (L)    ASSESSMENT AND PLAN: Vitamin D deficiency - Plan: Vitamin D, Ergocalciferol, (DRISDOL) 1.25 MG (50000 UT) CAPS capsule  Blood pressure elevated without history of HTN  At risk for osteoporosis  Class 1 obesity with serious comorbidity and body mass index (BMI) of 30.0 to 30.9 in adult, unspecified obesity type  PLAN: Vitamin D Deficiency Jenna Johnson was informed that low vitamin D levels contributes to fatigue and are associated with obesity, breast, and colon cancer. She agrees to continue to take prescription Vit D @50 ,000 IU every week #4 with no refills. She will follow up for routine testing of vitamin D, at least 2-3 times per year. She was informed of the risk of over-replacement of vitamin D and agrees to not increase her dose unless she discusses this with us first. Jenna FanningGeorge-Edna agrees to follow up with our office in 3 weeks.   At risk for osteopenia and osteoporosis Jenna FanningGeorge-Edna was given extended  (15 minutes) osteoporosis prevention counseling today. Jenna FanningGeorge-Edna is at risk for osteopenia and osteoporsis due to her vitamin D deficiency. She was encouraged to  take her vitamin D and follow her higher calcium diet and increase strengthening exercise to help strengthen her bones and decrease her risk of osteopenia and osteoporosis. Jenna Johnson agrees to follow up with our office in 3 weeks.   Elevated BP without diagnosis of HTN We discussed the risk associated with elevated BP. Jenna FanningGeorge-Edna has agreed to monitor her BP readings at home. Jenna Johnson agrees to follow up with our office in 3 weeks.   Obesity Jenna FanningGeorge-Edna is currently in the action stage of change. As such, her goal is to continue with weight loss efforts She has agreed to follow the Category 2 plan Jenna FanningGeorge-Edna has been instructed to work up to a goal of 150 minutes of combined cardio and strengthening exercise per week for weight loss and overall health benefits. We discussed the following Behavioral Modification Stratagies today: decreasing sodium intake and planning for success.   Jenna FanningGeorge-Edna has agreed to follow up with our clinic in 3 weeks. She was informed of the importance of frequent follow up visits to maximize her success with intensive lifestyle modifications for her multiple health conditions.   OBESITY BEHAVIORAL INTERVENTION VISIT  Today's visit was # 6   Starting weight: 171 lbs Starting date: 08/27/2018 Today's weight : Weight: 162 lb (73.5 kg)  Today's date: 11/05/2018 Total lbs lost to date: 9 lbs At least 15 minutes were spent on discussing the following behavioral intervention visit.   ASK: We discussed the diagnosis of obesity with Jenna Johnson today and Jenna Johnson agreed to give us permission to discuss obesity behavioral modification therapy today.  ASSESS: Jenna FanningGeorge-Edna has the diagnosis of obesity and her BMI today is 30.63 Jenna FanningGeorge-Edna is in the action stage of change   ADVISE: Jenna FanningGeorge-Edna was educated on the multiple  health risks of obesity as well as the benefit of weight loss to improve her health. She was advised of the need for long term  treatment and the importance of lifestyle modifications to improve her current health and to decrease her risk of future health problems.  AGREE: Multiple dietary modification options and treatment options were discussed and  Jenna Johnson agreed to follow the recommendations documented in the above note.  ARRANGE: Jenna Johnson was educated on the importance of frequent visits to treat obesity as outlined per CMS and USPSTF guidelines and agreed to schedule her next follow up appointment today.  I, Ellery Plunk, CMA, am acting as Energy manager for [place], FNP-C.  I have reviewed the above documentation for accuracy and completeness, and I agree with the above.  - Leone Putman, FNP-C.

## 2018-11-08 ENCOUNTER — Encounter (INDEPENDENT_AMBULATORY_CARE_PROVIDER_SITE_OTHER): Payer: Self-pay | Admitting: Family Medicine

## 2018-11-26 ENCOUNTER — Encounter (INDEPENDENT_AMBULATORY_CARE_PROVIDER_SITE_OTHER): Payer: Self-pay | Admitting: Family Medicine

## 2018-11-26 ENCOUNTER — Ambulatory Visit (INDEPENDENT_AMBULATORY_CARE_PROVIDER_SITE_OTHER): Payer: PRIVATE HEALTH INSURANCE | Admitting: Family Medicine

## 2018-11-26 VITALS — BP 141/83 | HR 68 | Temp 97.8°F | Ht 61.0 in | Wt 160.0 lb

## 2018-11-26 DIAGNOSIS — E559 Vitamin D deficiency, unspecified: Secondary | ICD-10-CM

## 2018-11-26 DIAGNOSIS — R7303 Prediabetes: Secondary | ICD-10-CM | POA: Insufficient documentation

## 2018-11-26 DIAGNOSIS — Z9189 Other specified personal risk factors, not elsewhere classified: Secondary | ICD-10-CM

## 2018-11-26 DIAGNOSIS — E669 Obesity, unspecified: Secondary | ICD-10-CM

## 2018-11-26 DIAGNOSIS — Z6830 Body mass index (BMI) 30.0-30.9, adult: Secondary | ICD-10-CM

## 2018-11-26 DIAGNOSIS — I10 Essential (primary) hypertension: Secondary | ICD-10-CM

## 2018-11-26 DIAGNOSIS — E66811 Obesity, class 1: Secondary | ICD-10-CM

## 2018-11-26 MED ORDER — VITAMIN D (ERGOCALCIFEROL) 1.25 MG (50000 UNIT) PO CAPS: 50000.0000 [IU] | ORAL_CAPSULE | ORAL | 0 refills | Status: DC

## 2018-11-26 NOTE — Progress Notes (Signed)
Office: [phone]  /  Fax: [phone]   HPI:   Chief Complaint: OBESITY Jenna Johnson is here to discuss her progress with her obesity treatment plan. She is on the  Category 2 plan and is following her eating plan approximately 95 % of the time. She states she is doing gym calisthenics or walking 30 minutes 3 times per week. Jenna Johnson is sticking well to the plan and eating all of the food on the plan.  Her weight is 160 lb (72.6 kg) today and has had a weight loss of 2 pounds over a period of 3 weeks since her last visit. She has lost 11 lbs since starting treatment with [place].  Vitamin D deficiency Jenna Johnson has a diagnosis of vitamin D deficiency. She is currently taking vit D and is not at goal. She denies nausea, vomiting, or muscle weakness.  Hypertension Jenna Johnson is a 62 y.o. female with hypertension. Jenna Johnson's blood pressure's are around 130's /80's at home. She is working on weight loss to help control her blood pressure with the goal of decreasing her risk of heart attack and stroke. Jenna Johnson is not on medications for HTN. She denies chest pain or shortness of breath.  Pre-Diabetes Jenna Johnson has a diagnosis of pre-diabetes based on her elevated Hgb A1c and was informed this puts her at greater risk of developing diabetes. She is not taking metformin currently and her last A1c was 5.9 on 08/27/18. She continues to work on diet and exercise to decrease risk of diabetes. She denies polyphagia.  At risk for diabetes Jenna Johnson is at higher than average risk for developing diabetes due to her pre-diabetes and obesity. She currently denies polyuria or polydipsia.  ASSESSMENT AND PLAN:  Vitamin D deficiency - Plan: VITAMIN D 25 Hydroxy (Vit-D Deficiency, Fractures), Vitamin D, Ergocalciferol, (DRISDOL) 1.25 MG (50000 UT) CAPS capsule  Essential hypertension  Prediabetes - Plan: Hemoglobin A1c, Insulin, random, Comprehensive metabolic panel  At risk for  diabetes mellitus  Class 1 obesity with serious comorbidity and body mass index (BMI) of 30.0 to 30.9 in adult, unspecified obesity type  PLAN:  Vitamin D Deficiency Jenna Johnson was informed that low vitamin D levels contributes to fatigue and are associated with obesity, breast, and colon cancer. She agrees to continue to take prescription Vit D @50 ,000 IU every week #4 with no refills and will follow up for routine testing of vitamin D, at least 2-3 times per year. She was informed of the risk of over-replacement of vitamin D and agrees to not increase her dose unless she discusses this with [place] first. We will recheck her vitamin D today and she agreed to follow up in 3 weeks.  Hypertension We discussed sodium restriction, working on healthy weight loss, and a regular exercise program as the means to achieve improved blood pressure control.  We will continue to monitor her blood pressure as well as her progress with the above lifestyle modifications. She will continue her meal plan and will watch for signs of hypotension as she continues her lifestyle modifications. We will continue to monitor her blood pressure and not start anti-hypertensive at this time since home BPs are at goal.. Jenna Johnson agreed with this plan and agreed to follow up as directed.  Pre-Diabetes Jenna Johnson will continue to work on weight loss, exercise, and decreasing simple carbohydrates in her diet to help decrease the risk of diabetes. She was informed that eating too many simple carbohydrates or too many calories at one sitting increases the likelihood of  GI side effects. Jenna Johnson agreed to continue her meal plan. Jenna Johnson agreed to follow up with us as directed to monitor her progress. We will check her A1c, a fasting Insulin and glucose today. Jenna Johnson agrees to follow up in 3 weeks.  Obesity Jenna Johnson is currently in the action stage of change. As such, her goal is to continue with weight loss efforts. She  has agreed to follow the Category 2 plan. Jenna Johnson has been instructed to do cardio for 30 minutes for 3 times a week and strength training 4 times a week. We discussed the following Behavioral Modification Strategies today: holiday eating strategies, celebration eating strategies, and planning for success. Jenna Johnson has agreed to follow up with our clinic in 3 weeks. She was informed of the importance of frequent follow up visits to maximize her success with intensive lifestyle modifications for her multiple health conditions.  ALLERGIES: No Known Allergies  MEDICATIONS: Current Outpatient Medications on File Prior to Visit  Medication Sig Dispense Refill  . acetaminophen (TYLENOL) 650 MG CR tablet Take 650 mg by mouth every 8 (eight) hours as needed for pain.    Marland Kitchen. aspirin EC 81 MG tablet Take 1 tablet (81 mg total) by mouth daily.    Marland Kitchen. ibuprofen (ADVIL,MOTRIN) 200 MG tablet Take 200 mg by mouth every 6 (six) hours as needed.    . loratadine (CLARITIN) 10 MG tablet Take 10 mg by mouth daily as needed.     . ranitidine (ZANTAC) 300 MG tablet TAKE 1 TABLET(300 MG) BY MOUTH AT BEDTIME AS NEEDED FOR HEARTBURN 90 tablet 0   No current facility-administered medications on file prior to visit.     PAST MEDICAL HISTORY: Past Medical History:  Diagnosis Date  . Allergic state   . Allergy   . Anemia   . Arthritis of knee, right   . Back pain   . Bursitis of both hips   . Chicken pox as a child  . Constipation   . Dermatitis 09/18/2013  . Endometriosis   . GERD (gastroesophageal reflux disease)   . H/O atrial septal defect   . Hip pain, bilateral 09/18/2013  . Hypertension   . IBS (irritable bowel syndrome)   . Joint pain   . Lactose intolerance   . Leg edema   . Measles as a child  . Multiple food allergies    Sugar  . Myalgia and myositis 09/18/2013  . Preventative health care 01/07/2014  . TIA (transient ischemic attack) 2004  . TIA (transient ischemic attack) 09/18/2013    . UTI (urinary tract infection)     PAST SURGICAL HISTORY: Past Surgical History:  Procedure Laterality Date  . ABDOMINAL HYSTERECTOMY  62 yrs old   still has one ovary  . APPENDECTOMY  62 yrs old  . ASD REPAIR  2006  . hole in heart    . RIGHT OOPHORECTOMY     ruptured ovarian cyst at age 89  . tia  surgery 2005    SOCIAL HISTORY: Social History   Tobacco Use  . Smoking status: Never Smoker  . Smokeless tobacco: Never Used  Substance Use Topics  . Alcohol use: Yes    Comment: occasionally  . Drug use: No    FAMILY HISTORY: Family History  Problem Relation Age of Onset  . Multiple sclerosis Mother   . Other Mother        blue tumor, air embolism,  . Hypertension Mother   . Obesity Mother   . Diabetes Father 5579  type 2  . Other Father        fatty liver, lactose intolerant  . Thyroid disease Father   . Cancer Paternal Grandmother 9       breast  . Diabetes Paternal Grandmother   . Other Sister        s/p hysterectomy  . Breast cancer Maternal Grandmother   . Other Sister        s/p hysterectomy   ROS: Review of Systems  Constitutional: Positive for weight loss.  Respiratory: Negative for shortness of breath.   Cardiovascular: Negative for chest pain.  Gastrointestinal: Negative for nausea and vomiting.  Genitourinary:       Negative for polyuria.  Musculoskeletal:       Negative for muscle weakness.  Endo/Heme/Allergies: Negative for polydipsia.       Negative for polyphagia.   PHYSICAL EXAM: Blood pressure (!) 141/83, pulse 68, temperature 97.8 F (36.6 C), temperature source Oral, height 5\' 1"  (1.549 m), weight 160 lb (72.6 kg), SpO2 98 %. Body mass index is 30.23 kg/m. Physical Exam Vitals signs reviewed.  Constitutional:      Appearance: Normal appearance. She is obese.  Cardiovascular:     Rate and Rhythm: Normal rate.  Pulmonary:     Effort: Pulmonary effort is normal.  Musculoskeletal: Normal range of motion.  Skin:     General: Skin is warm and dry.  Neurological:     Mental Status: She is alert and oriented to person, place, and time.  Psychiatric:        Mood and Affect: Mood normal.        Behavior: Behavior normal.    RECENT LABS AND TESTS: BMET    Component Value Date/Time   NA 139 08/27/2018 1300   K 4.3 08/27/2018 1300   CL 102 08/27/2018 1300   CO2 23 08/27/2018 1300   GLUCOSE 81 08/27/2018 1300   GLUCOSE 81 01/23/2018 1210   BUN 12 08/27/2018 1300   CREATININE 0.82 08/27/2018 1300   CREATININE 0.88 12/26/2013 1617   CALCIUM 9.3 08/27/2018 1300   GFRNONAA 78 08/27/2018 1300   GFRAA 90 08/27/2018 1300   Lab Results  Component Value Date   HGBA1C 5.9 (H) 08/27/2018   Lab Results  Component Value Date   INSULIN 8.4 08/27/2018   CBC    Component Value Date/Time   WBC 7.5 08/27/2018 1300   WBC 7.0 01/23/2018 1210   RBC 4.77 08/27/2018 1300   RBC 5.03 01/23/2018 1210   HGB 12.8 08/27/2018 1300   HCT 38.9 08/27/2018 1300   PLT 184.0 01/23/2018 1210   MCV 82 08/27/2018 1300   MCH 26.8 08/27/2018 1300   MCH 27.1 12/26/2013 1617   MCHC 32.9 08/27/2018 1300   MCHC 32.4 01/23/2018 1210   RDW 13.1 08/27/2018 1300   LYMPHSABS 2.5 08/27/2018 1300   EOSABS 0.0 08/27/2018 1300   BASOSABS 0.0 08/27/2018 1300   Iron/TIBC/Ferritin/ %Sat No results found for: IRON, TIBC, FERRITIN, IRONPCTSAT Lipid Panel     Component Value Date/Time   CHOL 146 08/27/2018 1300   TRIG 61 08/27/2018 1300   HDL 61 08/27/2018 1300   CHOLHDL 3 01/23/2018 1210   VLDL 12.6 01/23/2018 1210   LDLCALC 73 08/27/2018 1300   Hepatic Function Panel     Component Value Date/Time   PROT 7.1 08/27/2018 1300   ALBUMIN 4.4 08/27/2018 1300   AST 16 08/27/2018 1300   ALT 12 08/27/2018 1300   ALKPHOS 85 08/27/2018 1300  BILITOT 0.3 08/27/2018 1300   BILIDIR 0.1 12/26/2013 1617   IBILI 0.3 12/26/2013 1617      Component Value Date/Time   TSH 0.958 08/27/2018 1300   TSH 1.02 01/23/2018 1210   TSH 0.83  01/19/2017 1141   Results for Montel CulverMORROW, Jenna Johnson Y "YVETTE" (MRN [id number]) as of 11/26/2018 16:17  Ref. Range 08/27/2018 13:00  Vitamin D, 25-Hydroxy Latest Ref Range: 30.0 - 100.0 ng/mL 25.4 (L)    OBESITY BEHAVIORAL INTERVENTION VISIT  Today's visit was # 7   Starting weight: 171 lbs Starting date: 08/27/18 Today's weight : Weight: 160 lb (72.6 kg)  Today's date: 11/26/2018 Total lbs lost to date: 8311  ASK: We discussed the diagnosis of obesity with Montel CulverGeorge-Edna Y [place] today and Jenna Johnson agreed to give us permission to discuss obesity behavioral modification therapy today.  ASSESS: Jenna Johnson has the diagnosis of obesity and her BMI today is 30.2. Jenna Johnson is in the action stage of change.   ADVISE: Jenna Johnson was educated on the multiple health risks of obesity as well as the benefit of weight loss to improve her health. She was advised of the need for long term treatment and the importance of lifestyle modifications to improve her current health and to decrease her risk of future health problems.  AGREE: Multiple dietary modification options and treatment options were discussed and Jenna Johnson agreed to follow the recommendations documented in the above note.  ARRANGE: Jenna Johnson was educated on the importance of frequent visits to treat obesity as outlined per CMS and USPSTF guidelines and agreed to schedule her next follow up appointment today.  I, Kirke Corinara Soares, am acting as Energy managertranscriptionist for [place] Tool WorksDawn W. Lauriann Milillo, FNP-C.  I have reviewed the above documentation for accuracy and completeness, and I agree with the above.  - Modesty Rudy, FNP-C.

## 2018-12-11 ENCOUNTER — Encounter (INDEPENDENT_AMBULATORY_CARE_PROVIDER_SITE_OTHER): Payer: Self-pay | Admitting: Family Medicine

## 2018-12-11 DIAGNOSIS — R7303 Prediabetes: Secondary | ICD-10-CM

## 2018-12-11 DIAGNOSIS — E559 Vitamin D deficiency, unspecified: Secondary | ICD-10-CM

## 2018-12-11 MED ORDER — VITAMIN D (ERGOCALCIFEROL) 1.25 MG (50000 UNIT) PO CAPS: 50000.0000 [IU] | ORAL_CAPSULE | ORAL | 0 refills | Status: DC

## 2018-12-14 ENCOUNTER — Telehealth: Payer: Self-pay

## 2018-12-14 NOTE — Telephone Encounter (Signed)
Copied from CRM [id number]. Topic: General - Other >> Dec 14, 2018  9:25 AM Arlyss Gandy, NT wrote: Reason for CRM: Pt states that she is needing labs to be drawn. She states she went to the [facility] and they were unable to draw the labs. Pt is unsure of which labs are needing to be done. She states that Dr. Francena Hanly office was to get in contact with Dr. Abner Greenspan. Please advise. She states to call [phone]

## 2018-12-16 NOTE — Telephone Encounter (Signed)
Have not heard from them but order insulin, hgba1c, cmp for hyperglycemia. Vitamin D for vitamin d deficiency and tsh with cbc

## 2018-12-17 ENCOUNTER — Ambulatory Visit (INDEPENDENT_AMBULATORY_CARE_PROVIDER_SITE_OTHER): Payer: PRIVATE HEALTH INSURANCE | Admitting: Family Medicine

## 2018-12-17 ENCOUNTER — Encounter (INDEPENDENT_AMBULATORY_CARE_PROVIDER_SITE_OTHER): Payer: Self-pay

## 2018-12-20 ENCOUNTER — Telehealth: Payer: Self-pay

## 2018-12-20 ENCOUNTER — Ambulatory Visit (INDEPENDENT_AMBULATORY_CARE_PROVIDER_SITE_OTHER): Payer: PRIVATE HEALTH INSURANCE | Admitting: Physician Assistant

## 2018-12-20 ENCOUNTER — Encounter (INDEPENDENT_AMBULATORY_CARE_PROVIDER_SITE_OTHER): Payer: Self-pay | Admitting: Physician Assistant

## 2018-12-20 VITALS — BP 129/79 | HR 67 | Temp 97.6°F | Ht 61.0 in | Wt 159.0 lb

## 2018-12-20 DIAGNOSIS — E559 Vitamin D deficiency, unspecified: Secondary | ICD-10-CM

## 2018-12-20 DIAGNOSIS — I10 Essential (primary) hypertension: Secondary | ICD-10-CM | POA: Diagnosis not present

## 2018-12-20 DIAGNOSIS — R7303 Prediabetes: Secondary | ICD-10-CM

## 2018-12-20 DIAGNOSIS — E669 Obesity, unspecified: Secondary | ICD-10-CM

## 2018-12-20 DIAGNOSIS — Z9189 Other specified personal risk factors, not elsewhere classified: Secondary | ICD-10-CM

## 2018-12-20 DIAGNOSIS — Z6830 Body mass index (BMI) 30.0-30.9, adult: Secondary | ICD-10-CM

## 2018-12-20 NOTE — Progress Notes (Signed)
Office: [phone]  /  Fax: [phone]   HPI:   Chief Complaint: OBESITY Jenna Johnson is here to discuss her progress with her obesity treatment plan. She is on the Category 2 plan and is following her eating plan approximately 98 % of the time. She states she is swimming, biking, walking and calisthenics 40-45 minutes 4 times per week. Jenna did well with weight loss. She has been using My Fitness Pal to journal her food. She denies excessive hunger. Her weight is 159 lb (72.1 kg) today and has had a weight loss of 1 pounds over a period of 3 weeks since her last visit. She has lost 12 lbs since starting treatment with us.  Vitamin D deficiency Jenna Johnson has a diagnosis of vitamin D deficiency. She is currently taking prescription Vit D and denies nausea, vomiting or muscle weakness.  Hypertension Jenna Johnson is a 63 y.o. female with hypertension.  Jenna CulverGeorge-Edna Y Johnson denies chest pain. She is working weight loss to help control her blood pressure with the goal of decreasing her risk of heart attack and stroke. Jenna Johnson blood pressure is currently controlled. She is not taking medication.  Pre-Diabetes Jenna Johnson has a diagnosis of prediabetes based on her elevated Hgb A1c and was informed this puts her at greater risk of developing diabetes. She is not taking medication currently and continues to work on diet and exercise to decrease risk of diabetes. She denies nausea or hypoglycemia. She denies polyphagia.  At risk for cardiovascular disease Jenna Johnson is at a higher than average risk for cardiovascular disease due to obesity. She currently denies any chest pain.   ASSESSMENT AND PLAN:  Vitamin D deficiency  Essential hypertension  Prediabetes  At risk for heart disease  Class 1 obesity with serious comorbidity and body mass index (BMI) of 30.0 to 30.9 in adult, unspecified obesity type  PLAN:  Vitamin D Deficiency Jenna was informed that  low vitamin D levels contributes to fatigue and are associated with obesity, breast, and colon cancer. She agrees to continue to take prescription Vit D @50 ,000 IU every week #4 with no refills and will follow up for routine testing of vitamin D, at least 2-3 times per year. She was informed of the risk of over-replacement of vitamin D and agrees to not increase her dose unless she discusses this with us first. We will check labs. We will check labs today. Jenna agrees to follow up with our clinic in 3 weeks.  Hypertension We discussed sodium restriction, working on healthy weight loss, and a regular exercise program as the means to achieve improved blood pressure control. Jenna agreed with this plan and agreed to follow up as directed. We will continue to monitor her blood pressure as well as her progress with the above lifestyle modifications. She is not on medication and will watch for signs of hypotension as she continues her lifestyle modifications. We will check labs today. Jenna agrees to follow up with our clinic in 3 weeks.  Pre-Diabetes Jenna Johnson will continue to work on weight loss, exercise, and decreasing simple carbohydrates in her diet to help decrease the risk of diabetes. She was informed that eating too many simple carbohydrates or too many calories at one sitting increases the likelihood of GI side effects. Jenna Johnson is not on medication for now. She denies polyphagia. We will check labs today. Jenna agrees to follow up with our clinic in 3 weeks.  Cardiovascular risk counseling Jenna Johnson was given extended (15 minutes) coronary artery disease  prevention counseling today. She is 63 y.o. female and has risk factors for heart disease including obesity. We discussed intensive lifestyle modifications today with an emphasis on specific weight loss instructions and strategies. Pt was also informed of the importance of increasing exercise and decreasing saturated  fats to help prevent heart disease.  Obesity Jenna Johnson is currently in the action stage of change. As such, her goal is to continue with weight loss efforts She has agreed to follow the Category 2 plan Jenna Johnson has been instructed to work up to a goal of 150 minutes of combined cardio and strengthening exercise per week for weight loss and overall health benefits. We discussed the following Behavioral Modification Strategies today: work on meal planning and easy cooking plans and planning for success.  Jenna Johnson has agreed to follow up with our clinic in 3 weeks. She was informed of the importance of frequent follow up visits to maximize her success with intensive lifestyle modifications for her multiple health conditions.  ALLERGIES: No Known Allergies  MEDICATIONS: Current Outpatient Medications on File Prior to Visit  Medication Sig Dispense Refill  . acetaminophen (TYLENOL) 650 MG CR tablet Take 650 mg by mouth every 8 (eight) hours as needed for pain.    Marland Kitchen aspirin EC 81 MG tablet Take 1 tablet (81 mg total) by mouth daily.    Marland Kitchen ibuprofen (ADVIL,MOTRIN) 200 MG tablet Take 200 mg by mouth every 6 (six) hours as needed.    . loratadine (CLARITIN) 10 MG tablet Take 10 mg by mouth daily as needed.     . ranitidine (ZANTAC) 300 MG tablet TAKE 1 TABLET(300 MG) BY MOUTH AT BEDTIME AS NEEDED FOR HEARTBURN 90 tablet 0  . Vitamin D, Ergocalciferol, (DRISDOL) 1.25 MG (50000 UT) CAPS capsule Take 1 capsule (50,000 Units total) by mouth every 7 (seven) days. 4 capsule 0   No current facility-administered medications on file prior to visit.     PAST MEDICAL HISTORY: Past Medical History:  Diagnosis Date  . Allergic state   . Allergy   . Anemia   . Arthritis of knee, right   . Back pain   . Bursitis of both hips   . Chicken pox as a child  . Constipation   . Dermatitis 09/18/2013  . Endometriosis   . GERD (gastroesophageal reflux disease)   . H/O atrial septal defect   . Hip  pain, bilateral 09/18/2013  . Hypertension   . IBS (irritable bowel syndrome)   . Joint pain   . Lactose intolerance   . Leg edema   . Measles as a child  . Multiple food allergies    Sugar  . Myalgia and myositis 09/18/2013  . Preventative health care 01/07/2014  . TIA (transient ischemic attack) 2004  . TIA (transient ischemic attack) 09/18/2013  . UTI (urinary tract infection)     PAST SURGICAL HISTORY: Past Surgical History:  Procedure Laterality Date  . ABDOMINAL HYSTERECTOMY  63 yrs old   still has one ovary  . APPENDECTOMY  63 yrs old  . ASD REPAIR  2006  . hole in heart    . RIGHT OOPHORECTOMY     ruptured ovarian cyst at age 70  . tia  surgery 2005    SOCIAL HISTORY: Social History   Tobacco Use  . Smoking status: Never Smoker  . Smokeless tobacco: Never Used  Substance Use Topics  . Alcohol use: Yes    Comment: occasionally  . Drug use: No    FAMILY  HISTORY: Family History  Problem Relation Age of Onset  . Multiple sclerosis Mother   . Other Mother        blue tumor, air embolism,  . Hypertension Mother   . Obesity Mother   . Diabetes Father 18       type 2  . Other Father        fatty liver, lactose intolerant  . Thyroid disease Father   . Cancer Paternal Grandmother 8       breast  . Diabetes Paternal Grandmother   . Other Sister        s/p hysterectomy  . Breast cancer Maternal Grandmother   . Other Sister        s/p hysterectomy    ROS: Review of Systems  Constitutional: Positive for weight loss.  Cardiovascular: Negative for chest pain.  Gastrointestinal: Negative for nausea and vomiting.  Genitourinary: Negative for frequency.  Musculoskeletal:       Negative for muscle weakness  Endo/Heme/Allergies: Negative for polydipsia.       Negative for hypoglycemia Negative for polyphagia    PHYSICAL EXAM: Blood pressure 129/79, pulse 67, temperature 97.6 F (36.4 C), temperature source Oral, height 5\' 1"  (1.549 m), weight 159 lb  (72.1 kg), SpO2 98 %. Body mass index is 30.04 kg/m. Physical Exam Vitals signs reviewed.  Constitutional:      Appearance: Normal appearance. She is obese.  Cardiovascular:     Rate and Rhythm: Normal rate.     Pulses: Normal pulses.  Pulmonary:     Effort: Pulmonary effort is normal.  Musculoskeletal: Normal range of motion.  Skin:    General: Skin is warm and dry.  Neurological:     Mental Status: She is alert and oriented to person, place, and time.  Psychiatric:        Mood and Affect: Mood normal.        Behavior: Behavior normal.     RECENT LABS AND TESTS: BMET    Component Value Date/Time   NA 139 08/27/2018 1300   K 4.3 08/27/2018 1300   CL 102 08/27/2018 1300   CO2 23 08/27/2018 1300   GLUCOSE 81 08/27/2018 1300   GLUCOSE 81 01/23/2018 1210   BUN 12 08/27/2018 1300   CREATININE 0.82 08/27/2018 1300   CREATININE 0.88 12/26/2013 1617   CALCIUM 9.3 08/27/2018 1300   GFRNONAA 78 08/27/2018 1300   GFRAA 90 08/27/2018 1300   Lab Results  Component Value Date   HGBA1C 5.9 (H) 08/27/2018   Lab Results  Component Value Date   INSULIN 8.4 08/27/2018   CBC    Component Value Date/Time   WBC 7.5 08/27/2018 1300   WBC 7.0 01/23/2018 1210   RBC 4.77 08/27/2018 1300   RBC 5.03 01/23/2018 1210   HGB 12.8 08/27/2018 1300   HCT 38.9 08/27/2018 1300   PLT 184.0 01/23/2018 1210   MCV 82 08/27/2018 1300   MCH 26.8 08/27/2018 1300   MCH 27.1 12/26/2013 1617   MCHC 32.9 08/27/2018 1300   MCHC 32.4 01/23/2018 1210   RDW 13.1 08/27/2018 1300   LYMPHSABS 2.5 08/27/2018 1300   EOSABS 0.0 08/27/2018 1300   BASOSABS 0.0 08/27/2018 1300   Iron/TIBC/Ferritin/ %Sat No results found for: IRON, TIBC, FERRITIN, IRONPCTSAT Lipid Panel     Component Value Date/Time   CHOL 146 08/27/2018 1300   TRIG 61 08/27/2018 1300   HDL 61 08/27/2018 1300   CHOLHDL 3 01/23/2018 1210   VLDL 12.6 01/23/2018 1210  LDLCALC 73 08/27/2018 1300   Hepatic Function Panel       Component Value Date/Time   PROT 7.1 08/27/2018 1300   ALBUMIN 4.4 08/27/2018 1300   AST 16 08/27/2018 1300   ALT 12 08/27/2018 1300   ALKPHOS 85 08/27/2018 1300   BILITOT 0.3 08/27/2018 1300   BILIDIR 0.1 12/26/2013 1617   IBILI 0.3 12/26/2013 1617      Component Value Date/Time   TSH 0.958 08/27/2018 1300   TSH 1.02 01/23/2018 1210   TSH 0.83 01/19/2017 1141     Ref. Range 08/27/2018 13:00  Vitamin D, 25-Hydroxy Latest Ref Range: 30.0 - 100.0 ng/mL 25.4 (L)     OBESITY BEHAVIORAL INTERVENTION VISIT  Today's visit was # 8   Starting weight: 171 lbs Starting date: 08/27/2018 Today's weight :: 159 lbs  Today's date: 12/20/2018 Total lbs lost to date: 12  ASK: We discussed the diagnosis of obesity with Jenna Culver today and Jenna Fanning agreed to give [place] permission to discuss obesity behavioral modification therapy today.  ASSESS: Jenna Johnson has the diagnosis of obesity and her BMI today is 30.06 Jenna is in the action stage of change   ADVISE: Jenna Johnson was educated on the multiple health risks of obesity as well as the benefit of weight loss to improve her health. She was advised of the need for long term treatment and the importance of lifestyle modifications to improve her current health and to decrease her risk of future health problems.  AGREE: Multiple dietary modification options and treatment options were discussed and  Jenna agreed to follow the recommendations documented in the above note.  ARRANGE: Jenna Johnson was educated on the importance of frequent visits to treat obesity as outlined per CMS and USPSTF guidelines and agreed to schedule her next follow up appointment today.  I, Tammy Wysor, am acting as Energy manager for [employer], PA-C I, Alois Cliche, PA-C have reviewed above note and agree with its content

## 2018-12-20 NOTE — Telephone Encounter (Signed)
Please call and let them know we ordered labs

## 2018-12-20 NOTE — Telephone Encounter (Signed)
Ordered lab work.

## 2018-12-20 NOTE — Telephone Encounter (Signed)
Copied from CRM [id number]. Topic: General - Inquiry >> Dec 20, 2018  1:21 PM Windy Kalata, [place] wrote: Reason for CRM: April is calling from Dr. Dalbert Garnet office requesting to speak with Dr. Abner Greenspan or her nurse.    [phone]

## 2018-12-21 NOTE — Telephone Encounter (Signed)
Called office Office is closed on Fridays

## 2018-12-24 ENCOUNTER — Other Ambulatory Visit: Payer: Self-pay | Admitting: *Deleted

## 2018-12-24 DIAGNOSIS — R7303 Prediabetes: Secondary | ICD-10-CM

## 2018-12-24 NOTE — Telephone Encounter (Signed)
Patient will come in to the office for lab work on 12/26/2018. There are future labs in.

## 2018-12-26 ENCOUNTER — Other Ambulatory Visit (INDEPENDENT_AMBULATORY_CARE_PROVIDER_SITE_OTHER): Payer: 59

## 2018-12-26 DIAGNOSIS — Z6830 Body mass index (BMI) 30.0-30.9, adult: Secondary | ICD-10-CM | POA: Diagnosis not present

## 2018-12-26 DIAGNOSIS — E669 Obesity, unspecified: Secondary | ICD-10-CM | POA: Diagnosis not present

## 2018-12-26 DIAGNOSIS — R7303 Prediabetes: Secondary | ICD-10-CM | POA: Diagnosis not present

## 2018-12-26 DIAGNOSIS — I10 Essential (primary) hypertension: Secondary | ICD-10-CM

## 2018-12-26 DIAGNOSIS — E559 Vitamin D deficiency, unspecified: Secondary | ICD-10-CM

## 2018-12-26 LAB — CBC
HCT: 40.6 % (ref 36.0–46.0)
Hemoglobin: 13.4 g/dL (ref 12.0–15.0)
MCHC: 33.1 g/dL (ref 30.0–36.0)
MCV: 85.6 fl (ref 78.0–100.0)
Platelets: 152 10*3/uL (ref 150.0–400.0)
RBC: 4.75 Mil/uL (ref 3.87–5.11)
RDW: 14.4 % (ref 11.5–15.5)
WBC: 8.8 10*3/uL (ref 4.0–10.5)

## 2018-12-26 LAB — COMPREHENSIVE METABOLIC PANEL
ALBUMIN: 4.2 g/dL (ref 3.5–5.2)
ALT: 21 U/L (ref 0–35)
AST: 20 U/L (ref 0–37)
Alkaline Phosphatase: 74 U/L (ref 39–117)
BUN: 26 mg/dL — ABNORMAL HIGH (ref 6–23)
CHLORIDE: 103 meq/L (ref 96–112)
CO2: 29 mEq/L (ref 19–32)
CREATININE: 0.93 mg/dL (ref 0.40–1.20)
Calcium: 9.4 mg/dL (ref 8.4–10.5)
GFR: 74.24 mL/min (ref >60.00)
Glucose, Bld: 96 mg/dL (ref 70–99)
Potassium: 4.2 mEq/L (ref 3.5–5.1)
Sodium: 139 mEq/L (ref 135–145)
Total Bilirubin: 0.3 mg/dL (ref 0.2–1.2)
Total Protein: 7.2 g/dL (ref 6.0–8.3)

## 2018-12-26 LAB — TSH: TSH: 1.68 u[IU]/mL (ref 0.35–4.50)

## 2018-12-26 LAB — HEMOGLOBIN A1C: HEMOGLOBIN A1C: 5.9 % (ref 4.6–6.5)

## 2018-12-26 LAB — VITAMIN D 25 HYDROXY (VIT D DEFICIENCY, FRACTURES): VITD: 54.92 ng/mL (ref 30.00–100.00)

## 2018-12-27 ENCOUNTER — Encounter (INDEPENDENT_AMBULATORY_CARE_PROVIDER_SITE_OTHER): Payer: Self-pay | Admitting: Family Medicine

## 2019-01-01 LAB — INSULIN, FREE AND TOTAL
Free Insulin: 7.7 uU/mL
Total Insulin: 8.3 uU/mL

## 2019-01-14 ENCOUNTER — Ambulatory Visit (INDEPENDENT_AMBULATORY_CARE_PROVIDER_SITE_OTHER): Payer: PRIVATE HEALTH INSURANCE | Admitting: Family Medicine

## 2019-01-14 ENCOUNTER — Encounter (INDEPENDENT_AMBULATORY_CARE_PROVIDER_SITE_OTHER): Payer: Self-pay

## 2019-01-15 ENCOUNTER — Encounter (INDEPENDENT_AMBULATORY_CARE_PROVIDER_SITE_OTHER): Payer: Self-pay | Admitting: Family Medicine

## 2019-01-15 ENCOUNTER — Ambulatory Visit (INDEPENDENT_AMBULATORY_CARE_PROVIDER_SITE_OTHER): Payer: PRIVATE HEALTH INSURANCE | Admitting: Family Medicine

## 2019-01-15 VITALS — BP 132/89 | HR 69 | Temp 97.7°F | Ht 61.0 in | Wt 156.0 lb

## 2019-01-15 DIAGNOSIS — E669 Obesity, unspecified: Secondary | ICD-10-CM | POA: Diagnosis not present

## 2019-01-15 DIAGNOSIS — Z6830 Body mass index (BMI) 30.0-30.9, adult: Secondary | ICD-10-CM

## 2019-01-15 DIAGNOSIS — I10 Essential (primary) hypertension: Secondary | ICD-10-CM | POA: Diagnosis not present

## 2019-01-15 DIAGNOSIS — E559 Vitamin D deficiency, unspecified: Secondary | ICD-10-CM

## 2019-01-15 DIAGNOSIS — Z9189 Other specified personal risk factors, not elsewhere classified: Secondary | ICD-10-CM | POA: Diagnosis not present

## 2019-01-15 NOTE — Progress Notes (Signed)
Office: [phone]  /  Fax: [phone]   HPI:   Chief Complaint: OBESITY Jenna Johnson is here to discuss her progress with her obesity treatment plan. She is on the Category 2 plan and is following her eating plan approximately 95% of the time. She states she is doing aerobics and calisthenics 60 minutes 3 times per week. Jenna Johnson is sticking to her plan very closely and is steadily losing weight. Her weight is 156 lb (70.8 kg) today and has had a weight loss of 3 pounds over a period of 4 weeks since her last visit. She has lost 15 lbs since starting treatment with us.  Vitamin D deficiency Jenna Johnson has a diagnosis of Vitamin D deficiency. She is currently taking Vit D and denies nausea, vomiting or muscle weakness. Jenna's Vitamin D level 2 weeks ago was 54.92 which is at goal.  Hypertension Jenna CulverGeorge-Edna Y Johnson is a 63 y.o. female with hypertension.  Jenna Johnson denies chest pain but does report shortness of breath. She is working weight loss to help control her blood pressure with the goal of decreasing her risk of heart attack and stroke. George-Ednas blood pressure is currently controlled. Jenna Johnson is currently not on medications. She reports at home her blood pressures run 120's/70's.  At risk for cardiovascular disease Jenna Johnson is at a higher than average risk for cardiovascular disease due to obesity. She currently denies any chest pain.  ASSESSMENT AND PLAN:  Vitamin D deficiency  Essential hypertension  At risk for heart disease  Class 1 obesity with serious comorbidity and body mass index (BMI) of 30.0 to 30.9 in adult, unspecified obesity type - Starting BMI greater then 30  PLAN:  Vitamin D Deficiency Jenna was informed that low Vitamin D levels contributes to fatigue and are associated with obesity, breast, and colon cancer. Jenna Johnson will discontinue prescription Vitamin D and will start over-the-counter Vitamin D 2000 IU daily  and will follow-up for routine testing of Vitamin D, at least 2-3 times per year. She was informed of the risk of over-replacement of Vitamin D and agrees to not increase her dose unless she discusses this with us first.  Hypertension We discussed sodium restriction, working on healthy weight loss, and a regular exercise program as the means to achieve improved blood pressure control. Jenna agreed with this plan and agreed to follow-up as directed. We will continue to monitor her blood pressure as well as her progress with the above lifestyle modifications. She will continue her meal plan and continue to monitor her blood pressures at home. She will watch for signs of hypotension as she continues her lifestyle modifications.  Cardiovascular risk counseling Jenna Johnson was given extended (15 minutes) coronary artery disease prevention counseling today. She is 63 y.o. female and has risk factors for heart disease including obesity. We discussed intensive lifestyle modifications today with an emphasis on specific weight loss instructions and strategies. Pt was also informed of the importance of increasing exercise and decreasing saturated fats to help prevent heart disease.  Obesity Jenna Johnson is currently in the action stage of change. As such, her goal is to continue with weight loss efforts. She has agreed to follow the Category 2 plan. Jenna Johnson will continue current exercise regimen for weight loss and overall health benefits. We discussed the following Behavioral Modification Strategies today: Planning for success.  Jenna Johnson has agreed to follow-up with our clinic in 2 weeks. She was informed of the importance of frequent follow up visits to maximize her success with intensive  lifestyle modifications for her multiple health conditions.  ALLERGIES: No Known Allergies  MEDICATIONS: Current Outpatient Medications on File Prior to Visit  Medication Sig Dispense Refill  .  acetaminophen (TYLENOL) 650 MG CR tablet Take 650 mg by mouth every 8 (eight) hours as needed for pain.    Marland Kitchen aspirin EC 81 MG tablet Take 1 tablet (81 mg total) by mouth daily.    Marland Kitchen ibuprofen (ADVIL,MOTRIN) 200 MG tablet Take 200 mg by mouth every 6 (six) hours as needed.    . loratadine (CLARITIN) 10 MG tablet Take 10 mg by mouth daily as needed.     . ranitidine (ZANTAC) 300 MG tablet TAKE 1 TABLET(300 MG) BY MOUTH AT BEDTIME AS NEEDED FOR HEARTBURN 90 tablet 0  . Vitamin D, Ergocalciferol, (DRISDOL) 1.25 MG (50000 UT) CAPS capsule Take 1 capsule (50,000 Units total) by mouth every 7 (seven) days. 4 capsule 0   No current facility-administered medications on file prior to visit.     PAST MEDICAL HISTORY: Past Medical History:  Diagnosis Date  . Allergic state   . Allergy   . Anemia   . Arthritis of knee, right   . Back pain   . Bursitis of both hips   . Chicken pox as a child  . Constipation   . Dermatitis 09/18/2013  . Endometriosis   . GERD (gastroesophageal reflux disease)   . H/O atrial septal defect   . Hip pain, bilateral 09/18/2013  . Hypertension   . IBS (irritable bowel syndrome)   . Joint pain   . Lactose intolerance   . Leg edema   . Measles as a child  . Multiple food allergies    Sugar  . Myalgia and myositis 09/18/2013  . Preventative health care 01/07/2014  . TIA (transient ischemic attack) 2004  . TIA (transient ischemic attack) 09/18/2013  . UTI (urinary tract infection)     PAST SURGICAL HISTORY: Past Surgical History:  Procedure Laterality Date  . ABDOMINAL HYSTERECTOMY  64 yrs old   still has one ovary  . APPENDECTOMY  63 yrs old  . ASD REPAIR  2006  . hole in heart    . RIGHT OOPHORECTOMY     ruptured ovarian cyst at age 60  . tia  surgery 2005    SOCIAL HISTORY: Social History   Tobacco Use  . Smoking status: Never Smoker  . Smokeless tobacco: Never Used  Substance Use Topics  . Alcohol use: Yes    Comment: occasionally  . Drug use:  No    FAMILY HISTORY: Family History  Problem Relation Age of Onset  . Multiple sclerosis Mother   . Other Mother        blue tumor, air embolism,  . Hypertension Mother   . Obesity Mother   . Diabetes Father 32       type 2  . Other Father        fatty liver, lactose intolerant  . Thyroid disease Father   . Cancer Paternal Grandmother 43       breast  . Diabetes Paternal Grandmother   . Other Sister        s/p hysterectomy  . Breast cancer Maternal Grandmother   . Other Sister        s/p hysterectomy   ROS: Review of Systems  Constitutional: Positive for weight loss.  Respiratory: Positive for shortness of breath.   Cardiovascular: Negative for chest pain.  Gastrointestinal: Negative for nausea and vomiting.  Musculoskeletal:  Negative for muscle weakness.  Endo/Heme/Allergies:       Negative for hypoglycemia.   PHYSICAL EXAM: Blood pressure 132/89, pulse 69, temperature 97.7 F (36.5 C), temperature source Oral, height 5\' 1"  (1.549 m), weight 156 lb (70.8 kg), SpO2 98 %. Body mass index is 29.48 kg/m. Physical Exam Vitals signs reviewed.  Constitutional:      Appearance: Normal appearance. She is obese.  Cardiovascular:     Rate and Rhythm: Normal rate.     Pulses: Normal pulses.  Pulmonary:     Effort: Pulmonary effort is normal.     Breath sounds: Normal breath sounds.  Musculoskeletal: Normal range of motion.  Skin:    General: Skin is warm and dry.  Neurological:     Mental Status: She is alert and oriented to person, place, and time.  Psychiatric:        Behavior: Behavior normal.   RECENT LABS AND TESTS: BMET    Component Value Date/Time   NA 139 12/26/2018 0753   NA 139 08/27/2018 1300   K 4.2 12/26/2018 0753   CL 103 12/26/2018 0753   CO2 29 12/26/2018 0753   GLUCOSE 96 12/26/2018 0753   BUN 26 (H) 12/26/2018 0753   BUN 12 08/27/2018 1300   CREATININE 0.93 12/26/2018 0753   CREATININE 0.88 12/26/2013 1617   CALCIUM 9.4 12/26/2018  0753   GFRNONAA 78 08/27/2018 1300   GFRAA 90 08/27/2018 1300   Lab Results  Component Value Date   HGBA1C 5.9 12/26/2018   HGBA1C 5.9 (H) 08/27/2018   Lab Results  Component Value Date   INSULIN 8.4 08/27/2018   CBC    Component Value Date/Time   WBC 8.8 12/26/2018 0753   RBC 4.75 12/26/2018 0753   HGB 13.4 12/26/2018 0753   HGB 12.8 08/27/2018 1300   HCT 40.6 12/26/2018 0753   HCT 38.9 08/27/2018 1300   PLT 152.0 12/26/2018 0753   MCV 85.6 12/26/2018 0753   MCV 82 08/27/2018 1300   MCH 26.8 08/27/2018 1300   MCH 27.1 12/26/2013 1617   MCHC 33.1 12/26/2018 0753   RDW 14.4 12/26/2018 0753   RDW 13.1 08/27/2018 1300   LYMPHSABS 2.5 08/27/2018 1300   EOSABS 0.0 08/27/2018 1300   BASOSABS 0.0 08/27/2018 1300   Iron/TIBC/Ferritin/ %Sat No results found for: IRON, TIBC, FERRITIN, IRONPCTSAT Lipid Panel     Component Value Date/Time   CHOL 146 08/27/2018 1300   TRIG 61 08/27/2018 1300   HDL 61 08/27/2018 1300   CHOLHDL 3 01/23/2018 1210   VLDL 12.6 01/23/2018 1210   LDLCALC 73 08/27/2018 1300   Hepatic Function Panel     Component Value Date/Time   PROT 7.2 12/26/2018 0753   PROT 7.1 08/27/2018 1300   ALBUMIN 4.2 12/26/2018 0753   ALBUMIN 4.4 08/27/2018 1300   AST 20 12/26/2018 0753   ALT 21 12/26/2018 0753   ALKPHOS 74 12/26/2018 0753   BILITOT 0.3 12/26/2018 0753   BILITOT 0.3 08/27/2018 1300   BILIDIR 0.1 12/26/2013 1617   IBILI 0.3 12/26/2013 1617      Component Value Date/Time   TSH 1.68 12/26/2018 0753   TSH 0.958 08/27/2018 1300   TSH 1.02 01/23/2018 1210   Results for Alcide CleverMORROW, Jenna Y "YVETTE" (MRN [id number]) as of 01/15/2019 15:23  Ref. Range 08/27/2018 13:00  Vitamin D, 25-Hydroxy Latest Ref Range: 30.0 - 100.0 ng/mL 25.4 (L)   OBESITY BEHAVIORAL INTERVENTION VISIT  Today's visit was #9  Starting weight: 171 lbs Starting  date: 08/27/2018 Today's weight: 156 lbs Today's date: 01/15/2019 Total lbs lost to date: 15  ASK: We  discussed the diagnosis of obesity with Jenna Culver today and Jenna Fanning agreed to give [place] permission to discuss obesity behavioral modification therapy today.  ASSESS: Gisela has the diagnosis of obesity and her BMI today is @ 29.48. Jaidon is in the action stage of change.   ADVISE: Tandi was educated on the multiple health risks of obesity as well as the benefit of weight loss to improve her health. She was advised of the need for long term treatment and the importance of lifestyle modifications to improve her current health and to decrease her risk of future health problems.  AGREE: Multiple dietary modification options and treatment options were discussed and  Jenna agreed to follow the recommendations documented in the above note.  ARRANGE: Rashon was educated on the importance of frequent visits to treat obesity as outlined per CMS and USPSTF guidelines and agreed to schedule her next follow up appointment today.  IMarianna Payment, am acting as Energy manager for [employer], FNP-C.  I have reviewed the above documentation for accuracy and completeness, and I agree with the above.  - Soham Hollett, FNP-C.

## 2019-01-16 ENCOUNTER — Encounter (INDEPENDENT_AMBULATORY_CARE_PROVIDER_SITE_OTHER): Payer: Self-pay | Admitting: Family Medicine

## 2019-02-04 ENCOUNTER — Encounter (INDEPENDENT_AMBULATORY_CARE_PROVIDER_SITE_OTHER): Payer: Self-pay | Admitting: Family Medicine

## 2019-02-04 ENCOUNTER — Ambulatory Visit (INDEPENDENT_AMBULATORY_CARE_PROVIDER_SITE_OTHER): Payer: PRIVATE HEALTH INSURANCE | Admitting: Family Medicine

## 2019-02-04 VITALS — BP 140/82 | HR 64 | Temp 97.7°F | Ht 61.0 in | Wt 157.0 lb

## 2019-02-04 DIAGNOSIS — I10 Essential (primary) hypertension: Secondary | ICD-10-CM | POA: Diagnosis not present

## 2019-02-04 DIAGNOSIS — Z6830 Body mass index (BMI) 30.0-30.9, adult: Secondary | ICD-10-CM

## 2019-02-04 DIAGNOSIS — E669 Obesity, unspecified: Secondary | ICD-10-CM | POA: Diagnosis not present

## 2019-02-04 NOTE — Progress Notes (Signed)
Office: [phone]  /  Fax: [phone]   HPI:   Chief Complaint: OBESITY Jenna Johnson is here to discuss her progress with her obesity treatment plan. She is on the Category 2 plan and is following her eating plan approximately 98 % of the time. She states she is walking, swimming, and doing calisthenics 30 to 60 minutes 2 to 3 times per week. Jenna Johnson has eaten out more the last few weeks, as they have a new puppy and are very tired.  Her weight is 157 lb (71.2 kg) today and has had a weight gain of 1 pound over a period of 3 weeks since her last visit. She has lost 14 lbs since starting treatment with [place].  Hypertension Jenna Johnson is a 63 Johnson.o. female with hypertension. Jenna Johnson's blood pressure is borderline today. She does not take medications and hopes to control her blood pressure with weight loss and with the goal of decreasing her risk of heart attack and stroke. Jenna Johnson denies chest pain or shortness of breath.  ASSESSMENT AND PLAN:  Essential hypertension  Class 1 obesity with serious comorbidity and body mass index (BMI) of 30.0 to 30.9 in adult, unspecified obesity type - Starting BMI greater then 30  PLAN:  Hypertension We discussed sodium restriction, working on healthy weight loss, and a regular exercise program as the means to achieve improved blood pressure control. We will continue to monitor her blood pressure as well as her progress with the above lifestyle modifications. She will continue her meal plan and decrease the salt in her diet. She will watch for signs of hypotension as she continues her lifestyle modifications. Jenna Johnson agreed with this plan and agreed to follow up as directed in 3 weeks.  I spent > than 50% of the 15 minute visit on counseling as documented in the note.  Obesity Jenna Johnson is currently in the action stage of change. As such, her goal is to continue with weight loss efforts. She has agreed to follow the Category  2 plan. We discussed the following Behavioral Modification Strategies today: decreasing sodium intake, decrease eating out, and planning for success. Jenna Johnson will continue current exercise regimen for weight loss and overall health benefits.  Jenna Johnson has agreed to follow up with our clinic in 3 weeks. She was informed of the importance of frequent follow up visits to maximize her success with intensive lifestyle modifications for her multiple health conditions.  ALLERGIES: No Known Allergies  MEDICATIONS: Current Outpatient Medications on File Prior to Visit  Medication Sig Dispense Refill  . acetaminophen (TYLENOL) 650 MG CR tablet Take 650 mg by mouth every 8 (eight) hours as needed for pain.    Marland Kitchen aspirin EC 81 MG tablet Take 1 tablet (81 mg total) by mouth daily.    Marland Kitchen ibuprofen (ADVIL,MOTRIN) 200 MG tablet Take 200 mg by mouth every 6 (six) hours as needed.    . loratadine (CLARITIN) 10 MG tablet Take 10 mg by mouth daily as needed.     . ranitidine (ZANTAC) 300 MG tablet TAKE 1 TABLET(300 MG) BY MOUTH AT BEDTIME AS NEEDED FOR HEARTBURN 90 tablet 0  . Vitamin D, Ergocalciferol, (DRISDOL) 1.25 MG (50000 UT) CAPS capsule Take 1 capsule (50,000 Units total) by mouth every 7 (seven) days. 4 capsule 0   No current facility-administered medications on file prior to visit.     PAST MEDICAL HISTORY: Past Medical History:  Diagnosis Date  . Allergic state   . Allergy   . Anemia   .  Arthritis of knee, right   . Back pain   . Bursitis of both hips   . Chicken pox as a child  . Constipation   . Dermatitis 09/18/2013  . Endometriosis   . GERD (gastroesophageal reflux disease)   . H/O atrial septal defect   . Hip pain, bilateral 09/18/2013  . Hypertension   . IBS (irritable bowel syndrome)   . Joint pain   . Lactose intolerance   . Leg edema   . Measles as a child  . Multiple food allergies    Sugar  . Myalgia and myositis 09/18/2013  . Preventative health care 01/07/2014  . TIA  (transient ischemic attack) 2004  . TIA (transient ischemic attack) 09/18/2013  . UTI (urinary tract infection)     PAST SURGICAL HISTORY: Past Surgical History:  Procedure Laterality Date  . ABDOMINAL HYSTERECTOMY  63 yrs old   still has one ovary  . APPENDECTOMY  63 yrs old  . ASD REPAIR  2006  . hole in heart    . RIGHT OOPHORECTOMY     ruptured ovarian cyst at age 87  . tia  surgery 2005    SOCIAL HISTORY: Social History   Tobacco Use  . Smoking status: Never Smoker  . Smokeless tobacco: Never Used  Substance Use Topics  . Alcohol use: Yes    Comment: occasionally  . Drug use: No    FAMILY HISTORY: Family History  Problem Relation Age of Onset  . Multiple sclerosis Mother   . Other Mother        blue tumor, air embolism,  . Hypertension Mother   . Obesity Mother   . Diabetes Father 24       type 2  . Other Father        fatty liver, lactose intolerant  . Thyroid disease Father   . Cancer Paternal Grandmother 20       breast  . Diabetes Paternal Grandmother   . Other Sister        s/p hysterectomy  . Breast cancer Maternal Grandmother   . Other Sister        s/p hysterectomy    ROS: Review of Systems  Constitutional: Negative for weight loss.    PHYSICAL EXAM: Blood pressure 140/82, pulse 64, temperature 97.7 F (36.5 C), height 5\' 1"  (1.549 m), weight 157 lb (71.2 kg), SpO2 97 %. Body mass index is 29.66 kg/m. Physical Exam Vitals signs reviewed.  Constitutional:      Appearance: Normal appearance. She is obese.  Cardiovascular:     Rate and Rhythm: Normal rate.  Pulmonary:     Effort: Pulmonary effort is normal.  Musculoskeletal: Normal range of motion.  Skin:    General: Skin is warm and dry.  Neurological:     Mental Status: She is alert and oriented to person, place, and time.  Psychiatric:        Mood and Affect: Mood normal.        Behavior: Behavior normal.     RECENT LABS AND TESTS: BMET    Component Value Date/Time    NA 139 12/26/2018 0753   NA 139 08/27/2018 1300   K 4.2 12/26/2018 0753   CL 103 12/26/2018 0753   CO2 29 12/26/2018 0753   GLUCOSE 96 12/26/2018 0753   BUN 26 (H) 12/26/2018 0753   BUN 12 08/27/2018 1300   CREATININE 0.93 12/26/2018 0753   CREATININE 0.88 12/26/2013 1617   CALCIUM 9.4 12/26/2018 0753  GFRNONAA 78 08/27/2018 1300   GFRAA 90 08/27/2018 1300   Lab Results  Component Value Date   HGBA1C 5.9 12/26/2018   HGBA1C 5.9 (H) 08/27/2018   Lab Results  Component Value Date   INSULIN 8.4 08/27/2018   CBC    Component Value Date/Time   WBC 8.8 12/26/2018 0753   RBC 4.75 12/26/2018 0753   HGB 13.4 12/26/2018 0753   HGB 12.8 08/27/2018 1300   HCT 40.6 12/26/2018 0753   HCT 38.9 08/27/2018 1300   PLT 152.0 12/26/2018 0753   MCV 85.6 12/26/2018 0753   MCV 82 08/27/2018 1300   MCH 26.8 08/27/2018 1300   MCH 27.1 12/26/2013 1617   MCHC 33.1 12/26/2018 0753   RDW 14.4 12/26/2018 0753   RDW 13.1 08/27/2018 1300   LYMPHSABS 2.5 08/27/2018 1300   EOSABS 0.0 08/27/2018 1300   BASOSABS 0.0 08/27/2018 1300   Iron/TIBC/Ferritin/ %Sat No results found for: IRON, TIBC, FERRITIN, IRONPCTSAT Lipid Panel     Component Value Date/Time   CHOL 146 08/27/2018 1300   TRIG 61 08/27/2018 1300   HDL 61 08/27/2018 1300   CHOLHDL 3 01/23/2018 1210   VLDL 12.6 01/23/2018 1210   LDLCALC 73 08/27/2018 1300   Hepatic Function Panel     Component Value Date/Time   PROT 7.2 12/26/2018 0753   PROT 7.1 08/27/2018 1300   ALBUMIN 4.2 12/26/2018 0753   ALBUMIN 4.4 08/27/2018 1300   AST 20 12/26/2018 0753   ALT 21 12/26/2018 0753   ALKPHOS 74 12/26/2018 0753   BILITOT 0.3 12/26/2018 0753   BILITOT 0.3 08/27/2018 1300   BILIDIR 0.1 12/26/2013 1617   IBILI 0.3 12/26/2013 1617      Component Value Date/Time   TSH 1.68 12/26/2018 0753   TSH 0.958 08/27/2018 1300   TSH 1.02 01/23/2018 1210   Results for Jenna CleverMORROW, Jenna Johnson "YVETTE" (MRN [id number]) as of 02/04/2019 12:22  Ref.  Range 08/27/2018 13:00  Vitamin D, 25-Hydroxy Latest Ref Range: 30.0 - 100.0 ng/mL 25.4 (L)   OBESITY BEHAVIORAL INTERVENTION VISIT  Today's visit was # 10  Starting weight: 171 lbs Starting date: 08/27/18 Today's weight : Weight: 157 lb (71.2 kg)  Today's date: 02/04/2019 Total lbs lost to date: 14    02/04/2019  Height 5\' 1"  (1.549 m)  Weight 157 lb (71.2 kg)  BMI (Calculated) 29.68  BLOOD PRESSURE - SYSTOLIC 140  BLOOD PRESSURE - DIASTOLIC 82   Body Fat % 38.8 %  Total Body Water (lbs) 64.8 lbs   ASK: We discussed the diagnosis of obesity with Jenna CulverGeorge-Edna Johnson Johnson today and Jenna Johnson agreed to give us permission to discuss obesity behavioral modification therapy today.  ASSESS: Jenna FanningGeorge-Edna has the diagnosis of obesity and her BMI today is 29.68. Jenna FanningGeorge-Edna is in the action stage of change.   ADVISE: Jenna FanningGeorge-Edna was educated on the multiple health risks of obesity as well as the benefit of weight loss to improve her health. She was advised of the need for long term treatment and the importance of lifestyle modifications to improve her current health and to decrease her risk of future health problems.  AGREE: Multiple dietary modification options and treatment options were discussed and Jenna Johnson agreed to follow the recommendations documented in the above note.  ARRANGE: Jenna FanningGeorge-Edna was educated on the importance of frequent visits to treat obesity as outlined per CMS and USPSTF guidelines and agreed to schedule her next follow up appointment today.  Launa FlightI, Cara Soares, CMA, am acting as transcriptionist for  Jesse Sans, FNP-C.  I have reviewed the above documentation for accuracy and completeness, and I agree with the above.  -  , FNP-C.

## 2019-02-14 ENCOUNTER — Encounter: Payer: PRIVATE HEALTH INSURANCE | Admitting: Family Medicine

## 2019-02-25 ENCOUNTER — Ambulatory Visit (INDEPENDENT_AMBULATORY_CARE_PROVIDER_SITE_OTHER): Payer: PRIVATE HEALTH INSURANCE | Admitting: Family Medicine

## 2019-02-25 ENCOUNTER — Encounter (INDEPENDENT_AMBULATORY_CARE_PROVIDER_SITE_OTHER): Payer: Self-pay

## 2019-02-26 ENCOUNTER — Ambulatory Visit (INDEPENDENT_AMBULATORY_CARE_PROVIDER_SITE_OTHER): Payer: PRIVATE HEALTH INSURANCE | Admitting: Family Medicine

## 2019-02-26 ENCOUNTER — Other Ambulatory Visit: Payer: Self-pay

## 2019-02-26 ENCOUNTER — Encounter (INDEPENDENT_AMBULATORY_CARE_PROVIDER_SITE_OTHER): Payer: Self-pay

## 2019-02-26 ENCOUNTER — Encounter (INDEPENDENT_AMBULATORY_CARE_PROVIDER_SITE_OTHER): Payer: Self-pay | Admitting: Family Medicine

## 2019-02-26 DIAGNOSIS — R7303 Prediabetes: Secondary | ICD-10-CM

## 2019-02-26 DIAGNOSIS — I10 Essential (primary) hypertension: Secondary | ICD-10-CM | POA: Diagnosis not present

## 2019-02-26 DIAGNOSIS — E669 Obesity, unspecified: Secondary | ICD-10-CM | POA: Diagnosis not present

## 2019-02-26 DIAGNOSIS — Z6830 Body mass index (BMI) 30.0-30.9, adult: Secondary | ICD-10-CM

## 2019-02-26 NOTE — Progress Notes (Addendum)
Office: [phone]  /  Fax: [phone] TeleHealth Visit:  Jenna Johnson has consented to this TeleHealth visit today via telephone call. The patient is located at home, the provider is located at the [facility] office. The participants in this visit include the listed provider, patient, husband and any and all parties involved.   HPI:   Chief Complaint: OBESITY Jenna Johnson is here to discuss her progress with her obesity treatment plan. She is on the Category 2 plan and is following her eating plan approximately 99 % of the time. She states she is walking and biking for 30 to 60 minutes 4 to 5 times per week and she is doing Calisthenics and exercise bands 5 times per week. Jenna Johnson states her weight was 155 pounds yesterday and two weeks ago her weight was 157 pounds. She has no trouble finding food in stores, except for bread. She is baking her own wheat bread. We were unable to weight the patient today for this TeleHealth visit.She feels as if she has lost 2 lbs since her last visit. She has lost 16 lbs since starting treatment with [place].  Pre-Diabetes Jenna Johnson has a diagnosis of prediabetes based on her elevated Hgb A1c and was informed this puts her at greater risk of developing diabetes. She is not on metformin currently and continues to work on diet and exercise to decrease risk of diabetes. She admits to polyphagia when she exercises quite a bit. We discussed metformin briefly, but she declines. Lab Results  Component Value Date   HGBA1C 5.9 12/26/2018    Hypertension Jenna Johnson is a 63 y.o. female with hypertension. She is not currently taking medication and HTN has been well-controlled recently. She has not been checking her blood pressure at home. Jenna Johnson denies chest pain or shortness of breath on exertion. She is working weight loss to help control her blood pressure with the goal of decreasing her risk of heart attack and  stroke.   ASSESSMENT AND PLAN:  Prediabetes  Essential hypertension  Class 1 obesity with serious comorbidity and body mass index (BMI) of 30.0 to 30.9 in adult, unspecified obesity type - BMI greater than 30 at start of program  PLAN:  Pre-Diabetes Jenna Johnson will continue to work on weight loss, exercise, and decreasing simple carbohydrates in her diet to help decrease the risk of diabetes. She was informed that eating too many simple carbohydrates or too many calories at one sitting increases the likelihood of GI side effects. She will decrease exercise if polyphagia is a problem. Jenna Johnson will continue with the meal plan and follow up with [place] as directed to monitor her progress.  Hypertension We discussed sodium restriction, working on healthy weight loss, and a regular exercise program as the means to achieve improved blood pressure control. Jenna Johnson agreed with this plan and agreed to follow up as directed. We will continue to monitor her blood pressure as well as her progress with the above lifestyle modifications. Jenna Johnson will take her blood pressure a few times per week and she will record.  Obesity Jenna Johnson is currently in the action stage of change. As such, her goal is to continue with weight loss efforts She has agreed to follow the Category 2 plan Jenna Johnson will continue her current exercise regimen for weight loss and overall health benefits. We discussed the following Behavioral Modification Strategies today: planning for success and keeping healthy foods in the home  Jenna Johnson has agreed to follow up with our  clinic in 2 to 3 weeks. She was informed of the importance of frequent follow up visits to maximize her success with intensive lifestyle modifications for her multiple health conditions.  ALLERGIES: No Known Allergies  MEDICATIONS: Current Outpatient Medications on File Prior to Visit  Medication Sig Dispense Refill   cholecalciferol (VITAMIN  D3) 25 MCG (1000 UT) tablet Take 2,000 Units by mouth daily.     acetaminophen (TYLENOL) 650 MG CR tablet Take 650 mg by mouth every 8 (eight) hours as needed for pain.     aspirin EC 81 MG tablet Take 1 tablet (81 mg total) by mouth daily.     ibuprofen (ADVIL,MOTRIN) 200 MG tablet Take 200 mg by mouth every 6 (six) hours as needed.     loratadine (CLARITIN) 10 MG tablet Take 10 mg by mouth daily as needed.      ranitidine (ZANTAC) 300 MG tablet TAKE 1 TABLET(300 MG) BY MOUTH AT BEDTIME AS NEEDED FOR HEARTBURN 90 tablet 0   No current facility-administered medications on file prior to visit.     PAST MEDICAL HISTORY: Past Medical History:  Diagnosis Date   Allergic state    Allergy    Anemia    Arthritis of knee, right    Back pain    Bursitis of both hips    Chicken pox as a child   Constipation    Dermatitis 09/18/2013   Endometriosis    GERD (gastroesophageal reflux disease)    H/O atrial septal defect    Hip pain, bilateral 09/18/2013   Hypertension    IBS (irritable bowel syndrome)    Joint pain    Lactose intolerance    Leg edema    Measles as a child   Multiple food allergies    Sugar   Myalgia and myositis 09/18/2013   Preventative health care 01/07/2014   TIA (transient ischemic attack) 2004   TIA (transient ischemic attack) 09/18/2013   UTI (urinary tract infection)     PAST SURGICAL HISTORY: Past Surgical History:  Procedure Laterality Date   ABDOMINAL HYSTERECTOMY  63 yrs old   still has one ovary   APPENDECTOMY  63 yrs old   ASD REPAIR  2006   hole in heart     RIGHT OOPHORECTOMY     ruptured ovarian cyst at age 89   tia  surgery 2005    SOCIAL HISTORY: Social History   Tobacco Use   Smoking status: Never Smoker   Smokeless tobacco: Never Used  Substance Use Topics   Alcohol use: Yes    Comment: occasionally   Drug use: No    FAMILY HISTORY: Family History  Problem Relation Age of Onset    Multiple sclerosis Mother    Other Mother        blue tumor, air embolism,   Hypertension Mother    Obesity Mother    Diabetes Father 63       type 2   Other Father        fatty liver, lactose intolerant   Thyroid disease Father    Cancer Paternal Grandmother 46       breast   Diabetes Paternal Grandmother    Other Sister        s/p hysterectomy   Breast cancer Maternal Grandmother    Other Sister        s/p hysterectomy    ROS: Review of Systems  Constitutional: Positive for weight loss.  Respiratory: Negative for shortness of breath (on  exertion).   Cardiovascular: Negative for chest pain.  Endo/Heme/Allergies:       Positive for polyphagia    PHYSICAL EXAM: Pt in no acute distress  RECENT LABS AND TESTS: BMET    Component Value Date/Time   NA 139 12/26/2018 0753   NA 139 08/27/2018 1300   K 4.2 12/26/2018 0753   CL 103 12/26/2018 0753   CO2 29 12/26/2018 0753   GLUCOSE 96 12/26/2018 0753   BUN 26 (H) 12/26/2018 0753   BUN 12 08/27/2018 1300   CREATININE 0.93 12/26/2018 0753   CREATININE 0.88 12/26/2013 1617   CALCIUM 9.4 12/26/2018 0753   GFRNONAA 78 08/27/2018 1300   GFRAA 90 08/27/2018 1300   Lab Results  Component Value Date   HGBA1C 5.9 12/26/2018   HGBA1C 5.9 (H) 08/27/2018   Lab Results  Component Value Date   INSULIN 8.4 08/27/2018   CBC    Component Value Date/Time   WBC 8.8 12/26/2018 0753   RBC 4.75 12/26/2018 0753   HGB 13.4 12/26/2018 0753   HGB 12.8 08/27/2018 1300   HCT 40.6 12/26/2018 0753   HCT 38.9 08/27/2018 1300   PLT 152.0 12/26/2018 0753   MCV 85.6 12/26/2018 0753   MCV 82 08/27/2018 1300   MCH 26.8 08/27/2018 1300   MCH 27.1 12/26/2013 1617   MCHC 33.1 12/26/2018 0753   RDW 14.4 12/26/2018 0753   RDW 13.1 08/27/2018 1300   LYMPHSABS 2.5 08/27/2018 1300   EOSABS 0.0 08/27/2018 1300   BASOSABS 0.0 08/27/2018 1300   Iron/TIBC/Ferritin/ %Sat No results found for: IRON, TIBC, FERRITIN, IRONPCTSAT Lipid  Panel     Component Value Date/Time   CHOL 146 08/27/2018 1300   TRIG 61 08/27/2018 1300   HDL 61 08/27/2018 1300   CHOLHDL 3 01/23/2018 1210   VLDL 12.6 01/23/2018 1210   LDLCALC 73 08/27/2018 1300   Hepatic Function Panel     Component Value Date/Time   PROT 7.2 12/26/2018 0753   PROT 7.1 08/27/2018 1300   ALBUMIN 4.2 12/26/2018 0753   ALBUMIN 4.4 08/27/2018 1300   AST 20 12/26/2018 0753   ALT 21 12/26/2018 0753   ALKPHOS 74 12/26/2018 0753   BILITOT 0.3 12/26/2018 0753   BILITOT 0.3 08/27/2018 1300   BILIDIR 0.1 12/26/2013 1617   IBILI 0.3 12/26/2013 1617      Component Value Date/Time   TSH 1.68 12/26/2018 0753   TSH 0.958 08/27/2018 1300   TSH 1.02 01/23/2018 1210   Results for Jenna Johnson, Jenna Y "YVETTE" (MRN [id number]) as of 02/26/2019 15:25  Ref. Range 08/27/2018 13:00  Vitamin D, 25-Hydroxy Latest Ref Range: 30.0 - 100.0 ng/mL 25.4 (L)    I, [place] Crane, am acting as Energy manager for [place], FNP-C.  I have reviewed the above documentation for accuracy and completeness, and I agree with the above.  - Dawn Whitmire, FNP-C.

## 2019-02-27 ENCOUNTER — Encounter (INDEPENDENT_AMBULATORY_CARE_PROVIDER_SITE_OTHER): Payer: Self-pay | Admitting: Family Medicine

## 2019-02-27 ENCOUNTER — Encounter (INDEPENDENT_AMBULATORY_CARE_PROVIDER_SITE_OTHER): Payer: Self-pay

## 2019-02-28 ENCOUNTER — Ambulatory Visit (INDEPENDENT_AMBULATORY_CARE_PROVIDER_SITE_OTHER): Payer: PRIVATE HEALTH INSURANCE | Admitting: Family Medicine

## 2019-03-04 ENCOUNTER — Ambulatory Visit: Payer: PRIVATE HEALTH INSURANCE | Admitting: Family Medicine

## 2019-03-18 ENCOUNTER — Ambulatory Visit (INDEPENDENT_AMBULATORY_CARE_PROVIDER_SITE_OTHER): Payer: PRIVATE HEALTH INSURANCE | Admitting: Family Medicine

## 2019-03-18 ENCOUNTER — Other Ambulatory Visit: Payer: Self-pay

## 2019-03-18 ENCOUNTER — Encounter (INDEPENDENT_AMBULATORY_CARE_PROVIDER_SITE_OTHER): Payer: Self-pay | Admitting: Family Medicine

## 2019-03-18 DIAGNOSIS — R7303 Prediabetes: Secondary | ICD-10-CM

## 2019-03-18 DIAGNOSIS — Z6830 Body mass index (BMI) 30.0-30.9, adult: Secondary | ICD-10-CM

## 2019-03-18 DIAGNOSIS — E669 Obesity, unspecified: Secondary | ICD-10-CM

## 2019-03-18 NOTE — Telephone Encounter (Signed)
Please review

## 2019-03-18 NOTE — Progress Notes (Signed)
Office: [phone]  /  Fax: [phone] TeleHealth Visit:  Jenna Johnson has verbally consented to this TeleHealth visit today. The patient is located at home, the provider is located at the [facility] office. The participants in this visit include the listed provider, patient, and patient's husband, [name]. The visit was conducted today via FaceTime.  HPI:   Chief Complaint: OBESITY Jenna Johnson is here to discuss her progress with her obesity treatment plan. She is on the Category 2 plan and is following her eating plan approximately 90-97% of the time. She states she is biking 4-6 miles 3 times per week and walking 0.75-2.00 miles 5 times per week. Jenna Johnson reports her weight at home at the time of her last TeleHealth visit was 155 lbs; she reports her weight today is 154.4. She states she is sticking to the plan well. She reports desire to eat more related to stress but is sticking to the plan. We were unable to weigh the patient today for this TeleHealth visit. She feels as if she has lost 3 lbs since her last visit. She has lost 14 lbs since starting treatment with [place].  Pre-Diabetes Jenna Johnson has a diagnosis of prediabetes based on her elevated Hgb A1c and was informed this puts her at greater risk of developing diabetes. She is not taking metformin currently and continues to work on diet and exercise to decrease risk of diabetes. She denies polyphagia.  ASSESSMENT AND PLAN:  Prediabetes  Class 1 obesity with serious comorbidity and body mass index (BMI) of 30.0 to 30.9 in adult, unspecified obesity type  PLAN:  Pre-Diabetes Jenna Johnson will continue to work on weight loss, exercise, and decreasing simple carbohydrates in her diet to help decrease the risk of diabetes. We dicussed metformin including benefits and risks. She was informed that eating too many simple carbohydrates or too many calories at one sitting increases the likelihood of GI side  effects. Jenna Johnson is not taking metformin and a prescription was not written today. Jenna Johnson agreed to follow-up with [place] as directed to monitor her progress.  I spent > than 50% of the 15 minute visit on counseling as documented in the note.  Obesity Jenna Johnson is currently in the action stage of change. As such, her goal is to continue with weight loss efforts. She has agreed to follow the Category 2 plan. Jenna Johnson has been instructed to continue her current exercise regimen for weight loss and overall health benefits. We discussed the following Behavioral Modification Strategies today: keeping healthy foods in the home and planning for success.  Jenna Johnson has agreed to follow-up with our clinic in 2 weeks. She was informed of the importance of frequent follow-up visits to maximize her success with intensive lifestyle modifications for her multiple health conditions.  ALLERGIES: No Known Allergies  MEDICATIONS: Current Outpatient Medications on File Prior to Visit  Medication Sig Dispense Refill  . acetaminophen (TYLENOL) 650 MG CR tablet Take 650 mg by mouth every 8 (eight) hours as needed for pain.    Marland Kitchen aspirin EC 81 MG tablet Take 1 tablet (81 mg total) by mouth daily.    . cholecalciferol (VITAMIN D3) 25 MCG (1000 UT) tablet Take 2,000 Units by mouth daily.    Marland Kitchen ibuprofen (ADVIL,MOTRIN) 200 MG tablet Take 200 mg by mouth every 6 (six) hours as needed.    . loratadine (CLARITIN) 10 MG tablet Take 10 mg by mouth daily as needed.     . ranitidine (ZANTAC) 300 MG tablet TAKE  1 TABLET(300 MG) BY MOUTH AT BEDTIME AS NEEDED FOR HEARTBURN 90 tablet 0   No current facility-administered medications on file prior to visit.     PAST MEDICAL HISTORY: Past Medical History:  Diagnosis Date  . Allergic state   . Allergy   . Anemia   . Arthritis of knee, right   . Back pain   . Bursitis of both hips   . Chicken pox as a child  . Constipation   . Dermatitis 09/18/2013  .  Endometriosis   . GERD (gastroesophageal reflux disease)   . H/O atrial septal defect   . Hip pain, bilateral 09/18/2013  . Hypertension   . IBS (irritable bowel syndrome)   . Joint pain   . Lactose intolerance   . Leg edema   . Measles as a child  . Multiple food allergies    Sugar  . Myalgia and myositis 09/18/2013  . Preventative health care 01/07/2014  . TIA (transient ischemic attack) 2004  . TIA (transient ischemic attack) 09/18/2013  . UTI (urinary tract infection)     PAST SURGICAL HISTORY: Past Surgical History:  Procedure Laterality Date  . ABDOMINAL HYSTERECTOMY  63 yrs old   still has one ovary  . APPENDECTOMY  63 yrs old  . ASD REPAIR  2006  . hole in heart    . RIGHT OOPHORECTOMY     ruptured ovarian cyst at age 63  . tia  surgery 2005    SOCIAL HISTORY: Social History   Tobacco Use  . Smoking status: Never Smoker  . Smokeless tobacco: Never Used  Substance Use Topics  . Alcohol use: Yes    Comment: occasionally  . Drug use: No    FAMILY HISTORY: Family History  Problem Relation Age of Onset  . Multiple sclerosis Mother   . Other Mother        blue tumor, air embolism,  . Hypertension Mother   . Obesity Mother   . Diabetes Father 23       type 2  . Other Father        fatty liver, lactose intolerant  . Thyroid disease Father   . Cancer Paternal Grandmother 31       breast  . Diabetes Paternal Grandmother   . Other Sister        s/p hysterectomy  . Breast cancer Maternal Grandmother   . Other Sister        s/p hysterectomy   ROS: Review of Systems  Endo/Heme/Allergies:       Negative for polyphagia.   PHYSICAL EXAM: Pt in no acute distress  RECENT LABS AND TESTS: BMET    Component Value Date/Time   NA 139 12/26/2018 0753   NA 139 08/27/2018 1300   K 4.2 12/26/2018 0753   CL 103 12/26/2018 0753   CO2 29 12/26/2018 0753   GLUCOSE 96 12/26/2018 0753   BUN 26 (H) 12/26/2018 0753   BUN 12 08/27/2018 1300   CREATININE 0.93  12/26/2018 0753   CREATININE 0.88 12/26/2013 1617   CALCIUM 9.4 12/26/2018 0753   GFRNONAA 78 08/27/2018 1300   GFRAA 90 08/27/2018 1300   Lab Results  Component Value Date   HGBA1C 5.9 12/26/2018   HGBA1C 5.9 (H) 08/27/2018   Lab Results  Component Value Date   INSULIN 8.4 08/27/2018   CBC    Component Value Date/Time   WBC 8.8 12/26/2018 0753   RBC 4.75 12/26/2018 0753   HGB 13.4 12/26/2018 0753  HGB 12.8 08/27/2018 1300   HCT 40.6 12/26/2018 0753   HCT 38.9 08/27/2018 1300   PLT 152.0 12/26/2018 0753   MCV 85.6 12/26/2018 0753   MCV 82 08/27/2018 1300   MCH 26.8 08/27/2018 1300   MCH 27.1 12/26/2013 1617   MCHC 33.1 12/26/2018 0753   RDW 14.4 12/26/2018 0753   RDW 13.1 08/27/2018 1300   LYMPHSABS 2.5 08/27/2018 1300   EOSABS 0.0 08/27/2018 1300   BASOSABS 0.0 08/27/2018 1300   Iron/TIBC/Ferritin/ %Sat No results found for: IRON, TIBC, FERRITIN, IRONPCTSAT Lipid Panel     Component Value Date/Time   CHOL 146 08/27/2018 1300   TRIG 61 08/27/2018 1300   HDL 61 08/27/2018 1300   CHOLHDL 3 01/23/2018 1210   VLDL 12.6 01/23/2018 1210   LDLCALC 73 08/27/2018 1300   Hepatic Function Panel     Component Value Date/Time   PROT 7.2 12/26/2018 0753   PROT 7.1 08/27/2018 1300   ALBUMIN 4.2 12/26/2018 0753   ALBUMIN 4.4 08/27/2018 1300   AST 20 12/26/2018 0753   ALT 21 12/26/2018 0753   ALKPHOS 74 12/26/2018 0753   BILITOT 0.3 12/26/2018 0753   BILITOT 0.3 08/27/2018 1300   BILIDIR 0.1 12/26/2013 1617   IBILI 0.3 12/26/2013 1617      Component Value Date/Time   TSH 1.68 12/26/2018 0753   TSH 0.958 08/27/2018 1300   TSH 1.02 01/23/2018 1210   Results for Alcide CleverMORROW, Jenna Johnson Y "YVETTE" (MRN [id number]) as of 03/18/2019 11:46  Ref. Range 12/26/2018 07:53  VITD Latest Ref Range: 30.00 - 100.00 ng/mL 54.92    I, Marianna Paymentenise Haag, am acting as Energy managertranscriptionist for AshlandDawn Dantonio Justen, FNP-C.  I have reviewed the above documentation for accuracy and completeness, and I  agree with the above.  - Dawnetta Copenhaver, FNP-C.

## 2019-04-01 ENCOUNTER — Ambulatory Visit (INDEPENDENT_AMBULATORY_CARE_PROVIDER_SITE_OTHER): Payer: PRIVATE HEALTH INSURANCE | Admitting: Family Medicine

## 2019-04-01 ENCOUNTER — Encounter (INDEPENDENT_AMBULATORY_CARE_PROVIDER_SITE_OTHER): Payer: Self-pay | Admitting: Family Medicine

## 2019-04-01 ENCOUNTER — Other Ambulatory Visit: Payer: Self-pay

## 2019-04-01 DIAGNOSIS — E669 Obesity, unspecified: Secondary | ICD-10-CM | POA: Diagnosis not present

## 2019-04-01 DIAGNOSIS — I10 Essential (primary) hypertension: Secondary | ICD-10-CM | POA: Diagnosis not present

## 2019-04-01 DIAGNOSIS — Z6830 Body mass index (BMI) 30.0-30.9, adult: Secondary | ICD-10-CM | POA: Diagnosis not present

## 2019-04-01 NOTE — Progress Notes (Signed)
Office: [phone]  /  Fax: [phone] TeleHealth Visit:  SAMYA ANGELL has verbally consented to this TeleHealth visit today. The patient is located at home, the provider is located at the [facility] office. The participants in this visit include the listed provider, patient, and the patient's husband, [name]. The visit was conducted today via Webex.  HPI:   Chief Complaint: OBESITY Breilyn is here to discuss her progress with her obesity treatment plan. She is on the Category 2 plan and is following her eating plan approximately 95% of the time. She states she is biking 45-60 minutes 2 times per week and is walking 40 minutes 3 times per week. Drita states she struggles with sticking to the plan more than usual due to stress at being home more. She reports weighing 156 lbs today. She does state she is bored with food options and has had a few indiscretions with her diet such as some alcohol and extra calories. She states she is training for a 70 mile bike ride next year. We were unable to weigh the patient today for this TeleHealth visit. She feels as if she has gained 2 lbs since her last visit. She has lost 14 lbs since starting treatment with [place].  Hypertension TAMSEN OTERI is a 62 y.o. female with hypertension and is on no medication. HTN is well controlled.  DEJHA BORCHERT denies chest pain or shortness of breath on exertion. She is working weight loss to help control her blood pressure with the goal of decreasing her risk of heart attack and stroke. [name] reports her blood pressure readings at home to be 120/88 on 04/20; 141/93 on 04/26; and 120/82 on 04/27. BP Readings from Last 3 Encounters:  02/04/19 140/82  01/15/19 132/89  12/20/18 129/79    ASSESSMENT AND PLAN:  Essential hypertension  Class 1 obesity with serious comorbidity and body mass index (BMI) of 30.0 to 30.9 in adult, unspecified obesity type - Starting BMI  greater then 30  PLAN:  Hypertension We discussed sodium restriction, working on healthy weight loss, and a regular exercise program as the means to achieve improved blood pressure control. [name] agreed with this plan and agreed to follow up as directed. We will continue to monitor her blood pressure as well as her progress with the above lifestyle modifications. She will continue her meal plan and will watch for signs of hypotension as she continues her lifestyle modifications.  Obesity Avonelle is currently in the action stage of change. As such, her goal is to continue with weight loss efforts. She has agreed to follow the Category 2 plan. Genesi has been instructed to continue her current exercise regimen for weight loss and overall health benefits. We discussed the following Behavioral Modification Strategies today: planning for success.  Clementene has agreed to follow-up with our clinic in 2-3 weeks. She was informed of the importance of frequent follow-up visits to maximize her success with intensive lifestyle modifications for her multiple health conditions.  ALLERGIES: No Known Allergies  MEDICATIONS: Current Outpatient Medications on File Prior to Visit  Medication Sig Dispense Refill   acetaminophen (TYLENOL) 650 MG CR tablet Take 650 mg by mouth every 8 (eight) hours as needed for pain.     aspirin EC 81 MG tablet Take 1 tablet (81 mg total) by mouth daily.     cholecalciferol (VITAMIN D3) 25 MCG (1000 UT) tablet Take 2,000 Units by mouth daily.     ibuprofen (ADVIL,MOTRIN) 200 MG  tablet Take 200 mg by mouth every 6 (six) hours as needed.     loratadine (CLARITIN) 10 MG tablet Take 10 mg by mouth daily as needed.      ranitidine (ZANTAC) 300 MG tablet TAKE 1 TABLET(300 MG) BY MOUTH AT BEDTIME AS NEEDED FOR HEARTBURN 90 tablet 0   No current facility-administered medications on file prior to visit.     PAST MEDICAL HISTORY: Past Medical History:    Diagnosis Date   Allergic state    Allergy    Anemia    Arthritis of knee, right    Back pain    Bursitis of both hips    Chicken pox as a child   Constipation    Dermatitis 09/18/2013   Endometriosis    GERD (gastroesophageal reflux disease)    H/O atrial septal defect    Hip pain, bilateral 09/18/2013   Hypertension    IBS (irritable bowel syndrome)    Joint pain    Lactose intolerance    Leg edema    Measles as a child   Multiple food allergies    Sugar   Myalgia and myositis 09/18/2013   Preventative health care 01/07/2014   TIA (transient ischemic attack) 2004   TIA (transient ischemic attack) 09/18/2013   UTI (urinary tract infection)     PAST SURGICAL HISTORY: Past Surgical History:  Procedure Laterality Date   ABDOMINAL HYSTERECTOMY  63 yrs old   still has one ovary   APPENDECTOMY  63 yrs old   ASD REPAIR  2006   hole in heart     RIGHT OOPHORECTOMY     ruptured ovarian cyst at age 63   tia  surgery 2005    SOCIAL HISTORY: Social History   Tobacco Use   Smoking status: Never Smoker   Smokeless tobacco: Never Used  Substance Use Topics   Alcohol use: Yes    Comment: occasionally   Drug use: No    FAMILY HISTORY: Family History  Problem Relation Age of Onset   Multiple sclerosis Mother    Other Mother        blue tumor, air embolism,   Hypertension Mother    Obesity Mother    Diabetes Father 3079       type 2   Other Father        fatty liver, lactose intolerant   Thyroid disease Father    Cancer Paternal Grandmother 5970       breast   Diabetes Paternal Grandmother    Other Sister        s/p hysterectomy   Breast cancer Maternal Grandmother    Other Sister        s/p hysterectomy   ROS: Review of Systems  Respiratory: Negative for shortness of breath.   Cardiovascular: Negative for chest pain.   PHYSICAL EXAM: Pt in no acute distress  RECENT LABS AND TESTS: BMET    Component Value  Date/Time   NA 139 12/26/2018 0753   NA 139 08/27/2018 1300   K 4.2 12/26/2018 0753   CL 103 12/26/2018 0753   CO2 29 12/26/2018 0753   GLUCOSE 96 12/26/2018 0753   BUN 26 (H) 12/26/2018 0753   BUN 12 08/27/2018 1300   CREATININE 0.93 12/26/2018 0753   CREATININE 0.88 12/26/2013 1617   CALCIUM 9.4 12/26/2018 0753   GFRNONAA 78 08/27/2018 1300   GFRAA 90 08/27/2018 1300   Lab Results  Component Value Date   HGBA1C 5.9 12/26/2018  HGBA1C 5.9 (H) 08/27/2018   Lab Results  Component Value Date   INSULIN 8.4 08/27/2018   CBC    Component Value Date/Time   WBC 8.8 12/26/2018 0753   RBC 4.75 12/26/2018 0753   HGB 13.4 12/26/2018 0753   HGB 12.8 08/27/2018 1300   HCT 40.6 12/26/2018 0753   HCT 38.9 08/27/2018 1300   PLT 152.0 12/26/2018 0753   MCV 85.6 12/26/2018 0753   MCV 82 08/27/2018 1300   MCH 26.8 08/27/2018 1300   MCH 27.1 12/26/2013 1617   MCHC 33.1 12/26/2018 0753   RDW 14.4 12/26/2018 0753   RDW 13.1 08/27/2018 1300   LYMPHSABS 2.5 08/27/2018 1300   EOSABS 0.0 08/27/2018 1300   BASOSABS 0.0 08/27/2018 1300   Iron/TIBC/Ferritin/ %Sat No results found for: IRON, TIBC, FERRITIN, IRONPCTSAT Lipid Panel     Component Value Date/Time   CHOL 146 08/27/2018 1300   TRIG 61 08/27/2018 1300   HDL 61 08/27/2018 1300   CHOLHDL 3 01/23/2018 1210   VLDL 12.6 01/23/2018 1210   LDLCALC 73 08/27/2018 1300   Hepatic Function Panel     Component Value Date/Time   PROT 7.2 12/26/2018 0753   PROT 7.1 08/27/2018 1300   ALBUMIN 4.2 12/26/2018 0753   ALBUMIN 4.4 08/27/2018 1300   AST 20 12/26/2018 0753   ALT 21 12/26/2018 0753   ALKPHOS 74 12/26/2018 0753   BILITOT 0.3 12/26/2018 0753   BILITOT 0.3 08/27/2018 1300   BILIDIR 0.1 12/26/2013 1617   IBILI 0.3 12/26/2013 1617      Component Value Date/Time   TSH 1.68 12/26/2018 0753   TSH 0.958 08/27/2018 1300   TSH 1.02 01/23/2018 1210   Results for MARITSSA, HAUGHTON Y "YVETTE" (MRN [id number]) as of 04/01/2019  16:10  Ref. Range 08/27/2018 13:00  Vitamin D, 25-Hydroxy Latest Ref Range: 30.0 - 100.0 ng/mL 25.4 (L)   I, Marianna Payment, am acting as Energy manager for [place], FNP-C.  I have reviewed the above documentation for accuracy and completeness, and I agree with the above.  - Dawn Whitmire, FNP-C.

## 2019-04-02 ENCOUNTER — Encounter (INDEPENDENT_AMBULATORY_CARE_PROVIDER_SITE_OTHER): Payer: Self-pay | Admitting: Family Medicine

## 2019-04-16 ENCOUNTER — Ambulatory Visit (INDEPENDENT_AMBULATORY_CARE_PROVIDER_SITE_OTHER): Payer: Self-pay | Admitting: Family Medicine

## 2019-04-22 ENCOUNTER — Encounter (INDEPENDENT_AMBULATORY_CARE_PROVIDER_SITE_OTHER): Payer: Self-pay | Admitting: Family Medicine

## 2019-04-22 ENCOUNTER — Ambulatory Visit (INDEPENDENT_AMBULATORY_CARE_PROVIDER_SITE_OTHER): Payer: PRIVATE HEALTH INSURANCE | Admitting: Family Medicine

## 2019-04-22 ENCOUNTER — Other Ambulatory Visit: Payer: Self-pay

## 2019-04-22 DIAGNOSIS — R7303 Prediabetes: Secondary | ICD-10-CM

## 2019-04-22 DIAGNOSIS — E669 Obesity, unspecified: Secondary | ICD-10-CM | POA: Diagnosis not present

## 2019-04-22 DIAGNOSIS — Z6830 Body mass index (BMI) 30.0-30.9, adult: Secondary | ICD-10-CM

## 2019-04-22 NOTE — Progress Notes (Signed)
Office: [phone]  /  Fax: [phone] TeleHealth Visit:  Jenna Johnson has verbally consented to this TeleHealth visit today. The patient is located at home, the provider is located at the [facility] office. The participants in this visit include the listed provider, patient, and the patient's husband, [name]. The visit was conducted today via Webex.  HPI:   Chief Complaint: OBESITY Jenna Johnson is here to discuss her progress with her obesity treatment plan. She is on the Category 2 plan and is following her eating plan approximately 95% of the time. She states she is walking 1-2 miles 1 time per week and biking 5.5 miles daily. Jenna Johnson states she weighs 155 lbs today. She has lost 1 lb since her last office visit. She does report having a meal off plan recently at a [employer] recently. Her goal weight is 140-145 lbs. We were unable to weigh the patient today for this TeleHealth visit. She states her weight today is 155 lbs. She has lost 14 lbs since starting treatment with [place].  Pre-Diabetes Jenna Johnson has a diagnosis of prediabetes based on her elevated Hgb A1c and was informed this puts her at greater risk of developing diabetes. Her last A1c was reported to be 5.9 on 12/26/2018. She is not taking metformin currently and continues to work on diet and exercise to decrease risk of diabetes. She denies hypoglycemia or polyphagia.  ASSESSMENT AND PLAN:  Prediabetes  Class 1 obesity with serious comorbidity and body mass index (BMI) of 30.0 to 30.9 in adult, unspecified obesity type - Starting BMI greater then 30  PLAN:  Pre-Diabetes Jenna Johnson will continue to work on weight loss, exercise, and decreasing simple carbohydrates in her diet to help decrease the risk of diabetes. Maysen will continue her meal plan and agrees to follow-up with [place] as directed to monitor her progress.  I spent > than 50% of the 15 minute visit on counseling as  documented in the note.  Obesity Jenna Johnson is currently in the action stage of change. As such, her goal is to continue with weight loss efforts. She has agreed to follow the Category 2 plan. Jenna Johnson has been instructed to continue her current exercise regimen for weight loss and overall health benefits. We discussed the following Behavioral Modification Strategies today: planning for success.  Jenna Johnson has agreed to follow-up with our clinic in 2-3 weeks. She was informed of the importance of frequent follow-up visits to maximize her success with intensive lifestyle modifications for her multiple health conditions.  ALLERGIES: No Known Allergies  MEDICATIONS: Current Outpatient Medications on File Prior to Visit  Medication Sig Dispense Refill  . acetaminophen (TYLENOL) 650 MG CR tablet Take 650 mg by mouth every 8 (eight) hours as needed for pain.    Marland Kitchen aspirin EC 81 MG tablet Take 1 tablet (81 mg total) by mouth daily.    . cholecalciferol (VITAMIN D3) 25 MCG (1000 UT) tablet Take 2,000 Units by mouth daily.    Marland Kitchen ibuprofen (ADVIL,MOTRIN) 200 MG tablet Take 200 mg by mouth every 6 (six) hours as needed.    . loratadine (CLARITIN) 10 MG tablet Take 10 mg by mouth daily as needed.     . ranitidine (ZANTAC) 300 MG tablet TAKE 1 TABLET(300 MG) BY MOUTH AT BEDTIME AS NEEDED FOR HEARTBURN 90 tablet 0   No current facility-administered medications on file prior to visit.     PAST MEDICAL HISTORY: Past Medical History:  Diagnosis Date  . Allergic state   .  Allergy   . Anemia   . Arthritis of knee, right   . Back pain   . Bursitis of both hips   . Chicken pox as a child  . Constipation   . Dermatitis 09/18/2013  . Endometriosis   . GERD (gastroesophageal reflux disease)   . H/O atrial septal defect   . Hip pain, bilateral 09/18/2013  . Hypertension   . IBS (irritable bowel syndrome)   . Joint pain   . Lactose intolerance   . Leg edema   . Measles as a child  .  Multiple food allergies    Sugar  . Myalgia and myositis 09/18/2013  . Preventative health care 01/07/2014  . TIA (transient ischemic attack) 2004  . TIA (transient ischemic attack) 09/18/2013  . UTI (urinary tract infection)     PAST SURGICAL HISTORY: Past Surgical History:  Procedure Laterality Date  . ABDOMINAL HYSTERECTOMY  63 yrs old   still has one ovary  . APPENDECTOMY  63 yrs old  . ASD REPAIR  2006  . hole in heart    . RIGHT OOPHORECTOMY     ruptured ovarian cyst at age 48  . tia  surgery 2005    SOCIAL HISTORY: Social History   Tobacco Use  . Smoking status: Never Smoker  . Smokeless tobacco: Never Used  Substance Use Topics  . Alcohol use: Yes    Comment: occasionally  . Drug use: No    FAMILY HISTORY: Family History  Problem Relation Age of Onset  . Multiple sclerosis Mother   . Other Mother        blue tumor, air embolism,  . Hypertension Mother   . Obesity Mother   . Diabetes Father 98       type 2  . Other Father        fatty liver, lactose intolerant  . Thyroid disease Father   . Cancer Paternal Grandmother 12       breast  . Diabetes Paternal Grandmother   . Other Sister        s/p hysterectomy  . Breast cancer Maternal Grandmother   . Other Sister        s/p hysterectomy   ROS: Review of Systems  Endo/Heme/Allergies:       Negative for hypoglycemia. Negative for polyphagia.   PHYSICAL EXAM: Pt in no acute distress  RECENT LABS AND TESTS: BMET    Component Value Date/Time   NA 139 12/26/2018 0753   NA 139 08/27/2018 1300   K 4.2 12/26/2018 0753   CL 103 12/26/2018 0753   CO2 29 12/26/2018 0753   GLUCOSE 96 12/26/2018 0753   BUN 26 (H) 12/26/2018 0753   BUN 12 08/27/2018 1300   CREATININE 0.93 12/26/2018 0753   CREATININE 0.88 12/26/2013 1617   CALCIUM 9.4 12/26/2018 0753   GFRNONAA 78 08/27/2018 1300   GFRAA 90 08/27/2018 1300   Lab Results  Component Value Date   HGBA1C 5.9 12/26/2018   HGBA1C 5.9 (H) 08/27/2018    Lab Results  Component Value Date   INSULIN 8.4 08/27/2018   CBC    Component Value Date/Time   WBC 8.8 12/26/2018 0753   RBC 4.75 12/26/2018 0753   HGB 13.4 12/26/2018 0753   HGB 12.8 08/27/2018 1300   HCT 40.6 12/26/2018 0753   HCT 38.9 08/27/2018 1300   PLT 152.0 12/26/2018 0753   MCV 85.6 12/26/2018 0753   MCV 82 08/27/2018 1300   MCH 26.8 08/27/2018 1300  MCH 27.1 12/26/2013 1617   MCHC 33.1 12/26/2018 0753   RDW 14.4 12/26/2018 0753   RDW 13.1 08/27/2018 1300   LYMPHSABS 2.5 08/27/2018 1300   EOSABS 0.0 08/27/2018 1300   BASOSABS 0.0 08/27/2018 1300   Iron/TIBC/Ferritin/ %Sat No results found for: IRON, TIBC, FERRITIN, IRONPCTSAT Lipid Panel     Component Value Date/Time   CHOL 146 08/27/2018 1300   TRIG 61 08/27/2018 1300   HDL 61 08/27/2018 1300   CHOLHDL 3 01/23/2018 1210   VLDL 12.6 01/23/2018 1210   LDLCALC 73 08/27/2018 1300   Hepatic Function Panel     Component Value Date/Time   PROT 7.2 12/26/2018 0753   PROT 7.1 08/27/2018 1300   ALBUMIN 4.2 12/26/2018 0753   ALBUMIN 4.4 08/27/2018 1300   AST 20 12/26/2018 0753   ALT 21 12/26/2018 0753   ALKPHOS 74 12/26/2018 0753   BILITOT 0.3 12/26/2018 0753   BILITOT 0.3 08/27/2018 1300   BILIDIR 0.1 12/26/2013 1617   IBILI 0.3 12/26/2013 1617      Component Value Date/Time   TSH 1.68 12/26/2018 0753   TSH 0.958 08/27/2018 1300   TSH 1.02 01/23/2018 1210   Results for Alcide CleverMORROW, [name] Y "YVETTE" (MRN [id number]) as of 04/22/2019 11:50  Ref. Range 08/27/2018 13:00  Vitamin D, 25-Hydroxy Latest Ref Range: 30.0 - 100.0 ng/mL 25.4 (L)    I, Marianna Paymentenise Haag, am acting as Energy managertranscriptionist for [place]wn Alcie Runions, FNP-C.  I have reviewed the above documentation for accuracy and completeness, and I agree with the above.  - Braedyn Riggle, FNP-C.

## 2019-05-13 ENCOUNTER — Ambulatory Visit (INDEPENDENT_AMBULATORY_CARE_PROVIDER_SITE_OTHER): Payer: PRIVATE HEALTH INSURANCE | Admitting: Family Medicine

## 2019-05-15 ENCOUNTER — Encounter (INDEPENDENT_AMBULATORY_CARE_PROVIDER_SITE_OTHER): Payer: Self-pay | Admitting: Family Medicine

## 2019-05-15 ENCOUNTER — Ambulatory Visit (INDEPENDENT_AMBULATORY_CARE_PROVIDER_SITE_OTHER): Payer: PRIVATE HEALTH INSURANCE | Admitting: Family Medicine

## 2019-05-15 ENCOUNTER — Other Ambulatory Visit: Payer: Self-pay

## 2019-05-15 DIAGNOSIS — Z6830 Body mass index (BMI) 30.0-30.9, adult: Secondary | ICD-10-CM

## 2019-05-15 DIAGNOSIS — E669 Obesity, unspecified: Secondary | ICD-10-CM

## 2019-05-15 DIAGNOSIS — R7303 Prediabetes: Secondary | ICD-10-CM | POA: Diagnosis not present

## 2019-05-16 NOTE — Progress Notes (Signed)
Office: [phone]  /  Fax: [phone] TeleHealth Visit:  Jenna Johnson has verbally consented to this TeleHealth visit today. The patient is located at home, the provider is located at the [facility] office. The participants in this visit include the listed provider, patient, and her husband. The visit was conducted today via Webex.  HPI:   Chief Complaint: OBESITY Jenna Johnson is here to discuss her progress with her obesity treatment plan. She is on the Category 2 plan and is following her eating plan approximately 98 % of the time. She states she is walking 30 to 40 minutes 7 times per week and doing calisthenics 30 to 40 minutes 3 to 4 times per week. Jenna Johnson recently traveled to [place]. She has lost 1 pound since her last office visit. She is doing well on the plan, but is frustrated with slow weight loss.  We were unable to weigh the patient today for this TeleHealth visit. She feels as if she has lost weight since her last visit. She has lost 14 lbs since starting treatment with us.  Pre-Diabetes Jenna Johnson has a diagnosis of pre-diabetes based on her elevated Hgb A1c and was informed this puts her at greater risk of developing diabetes. She is not taking metformin currently and continues to work on diet and exercise to decrease risk of diabetes. She denies polyphagia..  Lab Results  Component Value Date   HGBA1C 5.9 12/26/2018    ASSESSMENT AND PLAN:  Prediabetes  Class 1 obesity with serious comorbidity and body mass index (BMI) of 30.0 to 30.9 in adult, unspecified obesity type  PLAN:  Pre-Diabetes Jenna Johnson will continue to work on weight loss, exercise, and decreasing simple carbohydrates in her diet to help decrease the risk of diabetes. She was informed that eating too many simple carbohydrates or too many calories at one sitting increases the likelihood of GI side effects. Jenna declined metformin and agreed to continue her meal  plan. Jenna agreed to follow up with us as directed to monitor her progress in 2 to 3 weeks.   I spent > than 50% of the 15 minute visit on counseling as documented in the note.  Obesity Jenna Johnson is currently in the action stage of change. As such, her goal is to continue with weight loss efforts. She has agreed to follow the Category 1 plan or follow the Category 2 plan. Jenna Johnson agrees to use the Category 2 plan on vigorous exercise days and Category 1 plan 2 to 3 days per week. Jenna Johnson has been instructed to continue her current exercise regimen, although exercise has been decreased a bit recently.. We discussed the following Behavioral Modification Strategies today: planning for success.  Jenna Johnson has agreed to follow up with our clinic in 2 to 3 weeks. She was informed of the importance of frequent follow up visits to maximize her success with intensive lifestyle modifications for her multiple health conditions.  ALLERGIES: No Known Allergies  MEDICATIONS: Current Outpatient Medications on File Prior to Visit  Medication Sig Dispense Refill  . acetaminophen (TYLENOL) 650 MG CR tablet Take 650 mg by mouth every 8 (eight) hours as needed for pain.    Marland Kitchen. aspirin EC 81 MG tablet Take 1 tablet (81 mg total) by mouth daily.    . cholecalciferol (VITAMIN D3) 25 MCG (1000 UT) tablet Take 2,000 Units by mouth daily.    Marland Kitchen. ibuprofen (ADVIL,MOTRIN) 200 MG tablet Take 200 mg by mouth every 6 (six) hours as needed.    .Marland Kitchen  loratadine (CLARITIN) 10 MG tablet Take 10 mg by mouth daily as needed.     . ranitidine (ZANTAC) 300 MG tablet TAKE 1 TABLET(300 MG) BY MOUTH AT BEDTIME AS NEEDED FOR HEARTBURN 90 tablet 0   No current facility-administered medications on file prior to visit.     PAST MEDICAL HISTORY: Past Medical History:  Diagnosis Date  . Allergic state   . Allergy   . Anemia   . Arthritis of knee, right   . Back pain   . Bursitis of both hips   . Chicken pox as a  child  . Constipation   . Dermatitis 09/18/2013  . Endometriosis   . GERD (gastroesophageal reflux disease)   . H/O atrial septal defect   . Hip pain, bilateral 09/18/2013  . Hypertension   . IBS (irritable bowel syndrome)   . Joint pain   . Lactose intolerance   . Leg edema   . Measles as a child  . Multiple food allergies    Sugar  . Myalgia and myositis 09/18/2013  . Preventative health care 01/07/2014  . TIA (transient ischemic attack) 2004  . TIA (transient ischemic attack) 09/18/2013  . UTI (urinary tract infection)     PAST SURGICAL HISTORY: Past Surgical History:  Procedure Laterality Date  . ABDOMINAL HYSTERECTOMY  63 yrs old   still has one ovary  . APPENDECTOMY  63 yrs old  . ASD REPAIR  2006  . hole in heart    . RIGHT OOPHORECTOMY     ruptured ovarian cyst at age 77  . tia  surgery 2005    SOCIAL HISTORY: Social History   Tobacco Use  . Smoking status: Never Smoker  . Smokeless tobacco: Never Used  Substance Use Topics  . Alcohol use: Yes    Comment: occasionally  . Drug use: No    FAMILY HISTORY: Family History  Problem Relation Age of Onset  . Multiple sclerosis Mother   . Other Mother        blue tumor, air embolism,  . Hypertension Mother   . Obesity Mother   . Diabetes Father 56       type 2  . Other Father        fatty liver, lactose intolerant  . Thyroid disease Father   . Cancer Paternal Grandmother 68       breast  . Diabetes Paternal Grandmother   . Other Sister        s/p hysterectomy  . Breast cancer Maternal Grandmother   . Other Sister        s/p hysterectomy    ROS: Review of Systems  Endo/Heme/Allergies:       Negative for polyphagia.    PHYSICAL EXAM: Pt in no acute distress  RECENT LABS AND TESTS: BMET    Component Value Date/Time   NA 139 12/26/2018 0753   NA 139 08/27/2018 1300   K 4.2 12/26/2018 0753   CL 103 12/26/2018 0753   CO2 29 12/26/2018 0753   GLUCOSE 96 12/26/2018 0753   BUN 26 (H)  12/26/2018 0753   BUN 12 08/27/2018 1300   CREATININE 0.93 12/26/2018 0753   CREATININE 0.88 12/26/2013 1617   CALCIUM 9.4 12/26/2018 0753   GFRNONAA 78 08/27/2018 1300   GFRAA 90 08/27/2018 1300   Lab Results  Component Value Date   HGBA1C 5.9 12/26/2018   HGBA1C 5.9 (H) 08/27/2018   Lab Results  Component Value Date   INSULIN 8.4 08/27/2018  CBC    Component Value Date/Time   WBC 8.8 12/26/2018 0753   RBC 4.75 12/26/2018 0753   HGB 13.4 12/26/2018 0753   HGB 12.8 08/27/2018 1300   HCT 40.6 12/26/2018 0753   HCT 38.9 08/27/2018 1300   PLT 152.0 12/26/2018 0753   MCV 85.6 12/26/2018 0753   MCV 82 08/27/2018 1300   MCH 26.8 08/27/2018 1300   MCH 27.1 12/26/2013 1617   MCHC 33.1 12/26/2018 0753   RDW 14.4 12/26/2018 0753   RDW 13.1 08/27/2018 1300   LYMPHSABS 2.5 08/27/2018 1300   EOSABS 0.0 08/27/2018 1300   BASOSABS 0.0 08/27/2018 1300   Iron/TIBC/Ferritin/ %Sat No results found for: IRON, TIBC, FERRITIN, IRONPCTSAT Lipid Panel     Component Value Date/Time   CHOL 146 08/27/2018 1300   TRIG 61 08/27/2018 1300   HDL 61 08/27/2018 1300   CHOLHDL 3 01/23/2018 1210   VLDL 12.6 01/23/2018 1210   LDLCALC 73 08/27/2018 1300   Hepatic Function Panel     Component Value Date/Time   PROT 7.2 12/26/2018 0753   PROT 7.1 08/27/2018 1300   ALBUMIN 4.2 12/26/2018 0753   ALBUMIN 4.4 08/27/2018 1300   AST 20 12/26/2018 0753   ALT 21 12/26/2018 0753   ALKPHOS 74 12/26/2018 0753   BILITOT 0.3 12/26/2018 0753   BILITOT 0.3 08/27/2018 1300   BILIDIR 0.1 12/26/2013 1617   IBILI 0.3 12/26/2013 1617      Component Value Date/Time   TSH 1.68 12/26/2018 0753   TSH 0.958 08/27/2018 1300   TSH 1.02 01/23/2018 1210   Results for Alcide CleverMORROW, Jenna Y "YVETTE" (MRN [id number]) as of 05/16/2019 12:05  Ref. Range 08/27/2018 13:00  Vitamin D, 25-Hydroxy Latest Ref Range: 30.0 - 100.0 ng/mL 25.4 (L)     I, Kirke Corinara Soares, CMA, am acting as Energy managertranscriptionist for [employer]. Ileah Falkenstein,  FNP-C  I have reviewed the above documentation for accuracy and completeness, and I agree with the above.  - Jenna Kittleson, FNP-C.

## 2019-05-20 ENCOUNTER — Encounter (INDEPENDENT_AMBULATORY_CARE_PROVIDER_SITE_OTHER): Payer: Self-pay | Admitting: Family Medicine

## 2019-06-04 ENCOUNTER — Other Ambulatory Visit: Payer: Self-pay

## 2019-06-04 ENCOUNTER — Telehealth (INDEPENDENT_AMBULATORY_CARE_PROVIDER_SITE_OTHER): Payer: PRIVATE HEALTH INSURANCE | Admitting: Family Medicine

## 2019-06-04 ENCOUNTER — Encounter (INDEPENDENT_AMBULATORY_CARE_PROVIDER_SITE_OTHER): Payer: Self-pay | Admitting: Family Medicine

## 2019-06-04 DIAGNOSIS — E669 Obesity, unspecified: Secondary | ICD-10-CM

## 2019-06-04 DIAGNOSIS — Z6830 Body mass index (BMI) 30.0-30.9, adult: Secondary | ICD-10-CM | POA: Diagnosis not present

## 2019-06-04 DIAGNOSIS — R7303 Prediabetes: Secondary | ICD-10-CM

## 2019-06-05 NOTE — Progress Notes (Signed)
Office: [phone]  /  Fax: [phone] TeleHealth Visit:  Jenna Johnson has verbally consented to this TeleHealth visit today. The patient is located at home, the provider is located at the [facility] office. The participants in this visit include the listed provider, patient, and the patient's husband, Jenna Johnson. The visit was conducted today via Webex.  HPI:   Chief Complaint: OBESITY Jenna Johnson is here to discuss her progress with her obesity treatment plan. She is on the Category 1 plan or Category 2 plan and is following her eating plan approximately 98% of the time. She states she is biking/walking/calisthenics 30-40 minutes 5 times per week. Jenna Johnson was given the option of following Category 1 last visit because she was frustrated with slow weight loss. Her RMR is 1556. She states she used Category 1 very little over the last 2 weeks. She reports getting more takeout food (about 2x per week).  We were unable to weigh the patient today for this TeleHealth visit. She feels as if she has gained a lb since her last visit. She has lost 14 lbs since starting treatment with us.  Pre-Diabetes Jenna Johnson has a diagnosis of prediabetes based on her elevated HgA1c and was informed this puts her at greater risk of developing diabetes. She refuses metformin and continues to work on diet and exercise to decrease risk of diabetes. She reports occasional hunger. Lab Results  Component Value Date   HGBA1C 5.9 12/26/2018     ASSESSMENT AND PLAN:  Prediabetes  Class 1 obesity with serious comorbidity and body mass index (BMI) of 30.0 to 30.9 in adult, unspecified obesity type  PLAN:  Pre-Diabetes Jenna Johnson will continue to work on weight loss, exercise, and decreasing simple carbohydrates in her diet to help decrease the risk of diabetes. Jenna Johnson. Jenna will continue her meal plan and follow-up with us as directed to monitor her progress.  I spent > than 50% of the  15 minute visit on counseling as documented in the note.  Obesity Jenna Johnson is currently in the action stage of change. As such, her goal is to continue with weight loss efforts. She has agreed to follow the Category 1 plan or Category 2. We will redo IC at her next in office visit. Jenna Johnson has been instructed to continue her current exercise regimen for weight loss and overall health benefits. We discussed the following Behavioral Modification Strategies today: decrease eating out.  Jenna Johnson has agreed to follow-up with our clinic in 6 weeks. She was informed of the importance of frequent follow-up visits to maximize her success with intensive lifestyle modifications for her multiple health conditions.  ALLERGIES: No Known Allergies  MEDICATIONS: Current Outpatient Medications on File Prior to Visit  Medication Sig Dispense Refill  . acetaminophen (TYLENOL) 650 MG CR tablet Take 650 mg by mouth every 8 (eight) hours as needed for pain.    Marland Kitchen. aspirin EC 81 MG tablet Take 1 tablet (81 mg total) by mouth daily.    . cholecalciferol (VITAMIN D3) 25 MCG (1000 UT) tablet Take 2,000 Units by mouth daily.    Marland Kitchen. ibuprofen (ADVIL,MOTRIN) 200 MG tablet Take 200 mg by mouth every 6 (six) hours as needed.    . loratadine (CLARITIN) 10 MG tablet Take 10 mg by mouth daily as needed.      No current facility-administered medications on file prior to visit.     PAST MEDICAL HISTORY: Past Medical History:  Diagnosis Date  . Allergic state   . Allergy   .  Anemia   . Arthritis of knee, right   . Back pain   . Bursitis of both hips   . Chicken pox as a child  . Constipation   . Dermatitis 09/18/2013  . Endometriosis   . GERD (gastroesophageal reflux disease)   . H/O atrial septal defect   . Hip pain, bilateral 09/18/2013  . Hypertension   . IBS (irritable bowel syndrome)   . Joint pain   . Lactose intolerance   . Leg edema   . Measles as a child  . Multiple food allergies     Sugar  . Myalgia and myositis 09/18/2013  . Preventative health care 01/07/2014  . TIA (transient ischemic attack) 2004  . TIA (transient ischemic attack) 09/18/2013  . UTI (urinary tract infection)     PAST SURGICAL HISTORY: Past Surgical History:  Procedure Laterality Date  . ABDOMINAL HYSTERECTOMY  63 yrs old   still has one ovary  . APPENDECTOMY  63 yrs old  . ASD REPAIR  2006  . hole in heart    . RIGHT OOPHORECTOMY     ruptured ovarian cyst at age 43  . tia  surgery 2005    SOCIAL HISTORY: Social History   Tobacco Use  . Smoking status: Never Smoker  . Smokeless tobacco: Never Used  Substance Use Topics  . Alcohol use: Yes    Comment: occasionally  . Drug use: No    FAMILY HISTORY: Family History  Problem Relation Age of Onset  . Multiple sclerosis Mother   . Other Mother        blue tumor, air embolism,  . Hypertension Mother   . Obesity Mother   . Diabetes Father 53       type 2  . Other Father        fatty liver, lactose intolerant  . Thyroid disease Father   . Cancer Paternal Grandmother 16       breast  . Diabetes Paternal Grandmother   . Other Sister        s/p hysterectomy  . Breast cancer Maternal Grandmother   . Other Sister        s/p hysterectomy    ROS: ROS none noted.  PHYSICAL EXAM: Pt in no acute distress  RECENT LABS AND TESTS: BMET    Component Value Date/Time   NA 139 12/26/2018 0753   NA 139 08/27/2018 1300   K 4.2 12/26/2018 0753   CL 103 12/26/2018 0753   CO2 29 12/26/2018 0753   GLUCOSE 96 12/26/2018 0753   BUN 26 (H) 12/26/2018 0753   BUN 12 08/27/2018 1300   CREATININE 0.93 12/26/2018 0753   CREATININE 0.88 12/26/2013 1617   CALCIUM 9.4 12/26/2018 0753   GFRNONAA 78 08/27/2018 1300   GFRAA 90 08/27/2018 1300   Lab Results  Component Value Date   HGBA1C 5.9 12/26/2018   HGBA1C 5.9 (H) 08/27/2018   Lab Results  Component Value Date   INSULIN 8.4 08/27/2018   CBC    Component Value Date/Time   WBC 8.8  12/26/2018 0753   RBC 4.75 12/26/2018 0753   HGB 13.4 12/26/2018 0753   HGB 12.8 08/27/2018 1300   HCT 40.6 12/26/2018 0753   HCT 38.9 08/27/2018 1300   PLT 152.0 12/26/2018 0753   MCV 85.6 12/26/2018 0753   MCV 82 08/27/2018 1300   MCH 26.8 08/27/2018 1300   MCH 27.1 12/26/2013 1617   MCHC 33.1 12/26/2018 0753   RDW 14.4 12/26/2018 0753  RDW 13.1 08/27/2018 1300   LYMPHSABS 2.5 08/27/2018 1300   EOSABS 0.0 08/27/2018 1300   BASOSABS 0.0 08/27/2018 1300   Iron/TIBC/Ferritin/ %Sat No results found for: IRON, TIBC, FERRITIN, IRONPCTSAT Lipid Panel     Component Value Date/Time   CHOL 146 08/27/2018 1300   TRIG 61 08/27/2018 1300   HDL 61 08/27/2018 1300   CHOLHDL 3 01/23/2018 1210   VLDL 12.6 01/23/2018 1210   LDLCALC 73 08/27/2018 1300   Hepatic Function Panel     Component Value Date/Time   PROT 7.2 12/26/2018 0753   PROT 7.1 08/27/2018 1300   ALBUMIN 4.2 12/26/2018 0753   ALBUMIN 4.4 08/27/2018 1300   AST 20 12/26/2018 0753   ALT 21 12/26/2018 0753   ALKPHOS 74 12/26/2018 0753   BILITOT 0.3 12/26/2018 0753   BILITOT 0.3 08/27/2018 1300   BILIDIR 0.1 12/26/2013 1617   IBILI 0.3 12/26/2013 1617      Component Value Date/Time   TSH 1.68 12/26/2018 0753   TSH 0.958 08/27/2018 1300   TSH 1.02 01/23/2018 1210   Results for Alcide CleverMORROW, Jenna Y "YVETTE" (MRN [id number]) as of 06/05/2019 08:59  Ref. Range 08/27/2018 13:00  Vitamin D, 25-Hydroxy Latest Ref Range: 30.0 - 100.0 ng/mL 25.4 (L)   I, Marianna Paymentenise Haag, am acting as Energy managertranscriptionist for [place] , FNP  I have reviewed the above documentation for accuracy and completeness, and I agree with the above.  -  , FNP-C.

## 2019-06-19 ENCOUNTER — Other Ambulatory Visit: Payer: Self-pay | Admitting: Family Medicine

## 2019-06-19 DIAGNOSIS — Z1231 Encounter for screening mammogram for malignant neoplasm of breast: Secondary | ICD-10-CM

## 2019-07-03 ENCOUNTER — Other Ambulatory Visit: Payer: Self-pay

## 2019-07-03 ENCOUNTER — Ambulatory Visit
Admission: RE | Admit: 2019-07-03 | Discharge: 2019-07-03 | Disposition: A | Discharge status: Home / Self Care | Payer: PRIVATE HEALTH INSURANCE | Source: Ambulatory Visit | Attending: Family Medicine | Admitting: Family Medicine

## 2019-07-03 DIAGNOSIS — Z1231 Encounter for screening mammogram for malignant neoplasm of breast: Secondary | ICD-10-CM

## 2019-07-23 ENCOUNTER — Telehealth (INDEPENDENT_AMBULATORY_CARE_PROVIDER_SITE_OTHER): Payer: 59 | Admitting: Family Medicine

## 2019-07-23 ENCOUNTER — Encounter (INDEPENDENT_AMBULATORY_CARE_PROVIDER_SITE_OTHER): Payer: Self-pay | Admitting: Family Medicine

## 2019-07-23 ENCOUNTER — Other Ambulatory Visit: Payer: Self-pay

## 2019-07-23 DIAGNOSIS — Z6830 Body mass index (BMI) 30.0-30.9, adult: Secondary | ICD-10-CM | POA: Diagnosis not present

## 2019-07-23 DIAGNOSIS — E669 Obesity, unspecified: Secondary | ICD-10-CM

## 2019-07-23 DIAGNOSIS — R7303 Prediabetes: Secondary | ICD-10-CM | POA: Diagnosis not present

## 2019-07-24 NOTE — Progress Notes (Signed)
Office: [phone]  /  Fax: [phone] TeleHealth Visit:  Jenna Johnson has verbally consented to this TeleHealth visit today. The patient is on vacation, the provider is located at the [facility] office. The participants in this visit include the listed provider, patient, and the patient's husband, [name]. The visit was conducted today via Webex.  HPI:   Chief Complaint: OBESITY Jenna Johnson is here to discuss her progress with her obesity treatment plan. She is on the Category 2 plan and is following her eating plan approximately 98% of the time. She states she is walking 50-60 minutes daily and working with weights 20-30 minutes daily. Jenna Johnson has maintained her weight at 155 lbs. She is frustrated with lack of weight loss. WE discussed amending discussed possible goal weight of 150 lbs. She may start fasting on Fridays for religious reasons. We were unable to weigh the patient today for this TeleHealth visit. She feels as if she has maintained her weight since her last visit. She has lost 14 lbs since starting treatment with [place].  Pre-Diabetes Jenna Johnson has a diagnosis of prediabetes based on her elevated Hgb A1c and was informed this puts her at greater risk of developing diabetes. Last A1c was 5.9 on 12/26/2018. She is not taking metformin currently and declines it at this time. She continues to work on diet and exercise to decrease risk of diabetes. She does report more hunger the day after heavy exercise days.  Lab Results  Component Value Date   HGBA1C 5.9 12/26/2018    ASSESSMENT AND PLAN:  Prediabetes  Class 1 obesity with serious comorbidity and body mass index (BMI) of 30.0 to 30.9 in adult, unspecified obesity type  PLAN:  Pre-Diabetes Jenna Johnson will continue to work on weight loss, exercise, and decreasing simple carbohydrates in her diet to help decrease the risk of diabetes. We dicussed metformin including benefits and risks. She was  informed that eating too many simple carbohydrates or too many calories at one sitting increases the likelihood of GI side effects. Yulanda will continue her meal plan and follow-up with [place] as directed to monitor her progress.  Obesity Jenna Johnson is currently in the action stage of change. As such, her goal is to continue with weight loss efforts. She has agreed to follow the Category 1 plan/Category 2 plan. She will do Category 1 two days per week and Category 2 two days per week. Jenna Johnson has been instructed to continue her current exercise regimen for weight loss and overall health benefits. We discussed the following Behavioral Modification Stategies today: increasing lean protein intake and planning for success.  Jenna Johnson has agreed to follow-up with our clinic in 2-3 weeks. She was informed of the importance of frequent follow-up visits to maximize her success with intensive lifestyle modifications for her multiple health conditions.  ALLERGIES: No Known Allergies  MEDICATIONS: Current Outpatient Medications on File Prior to Visit  Medication Sig Dispense Refill   acetaminophen (TYLENOL) 650 MG CR tablet Take 650 mg by mouth every 8 (eight) hours as needed for pain.     aspirin EC 81 MG tablet Take 1 tablet (81 mg total) by mouth daily.     cholecalciferol (VITAMIN D3) 25 MCG (1000 UT) tablet Take 2,000 Units by mouth daily.     ibuprofen (ADVIL,MOTRIN) 200 MG tablet Take 200 mg by mouth every 6 (six) hours as needed.     loratadine (CLARITIN) 10 MG tablet Take 10 mg by mouth daily as needed.  No current facility-administered medications on file prior to visit.     PAST MEDICAL HISTORY: Past Medical History:  Diagnosis Date   Allergic state    Allergy    Anemia    Arthritis of knee, right    Back pain    Bursitis of both hips    Chicken pox as a child   Constipation    Dermatitis 09/18/2013   Endometriosis    GERD (gastroesophageal reflux  disease)    H/O atrial septal defect    Hip pain, bilateral 09/18/2013   Hypertension    IBS (irritable bowel syndrome)    Joint pain    Lactose intolerance    Leg edema    Measles as a child   Multiple food allergies    Sugar   Myalgia and myositis 09/18/2013   Preventative health care 01/07/2014   TIA (transient ischemic attack) 2004   TIA (transient ischemic attack) 09/18/2013   UTI (urinary tract infection)     PAST SURGICAL HISTORY: Past Surgical History:  Procedure Laterality Date   ABDOMINAL HYSTERECTOMY  63 yrs old   still has one ovary   APPENDECTOMY  63 yrs old   ASD REPAIR  2006   hole in heart     RIGHT OOPHORECTOMY     ruptured ovarian cyst at age 63   tia  surgery 2005    SOCIAL HISTORY: Social History   Tobacco Use   Smoking status: Never Smoker   Smokeless tobacco: Never Used  Substance Use Topics   Alcohol use: Yes    Comment: occasionally   Drug use: No    FAMILY HISTORY: Family History  Problem Relation Age of Onset   Multiple sclerosis Mother    Other Mother        blue tumor, air embolism,   Hypertension Mother    Obesity Mother    Diabetes Father [id number]       type 2   Other Father        fatty liver, lactose intolerant   Thyroid disease Father    Cancer Paternal Grandmother [id number]       breast   Diabetes Paternal Grandmother    Other Sister        s/p hysterectomy   Breast cancer Maternal Grandmother    Other Sister        s/p hysterectomy   ROS: ROS none reported.  PHYSICAL EXAM: Pt in no acute distress  RECENT LABS AND TESTS: BMET    Component Value Date/Time   NA 139 12/26/2018 0753   NA 139 08/27/2018 1300   K 4.2 12/26/2018 0753   CL 103 12/26/2018 0753   CO2 29 12/26/2018 0753   GLUCOSE 96 12/26/2018 0753   BUN 26 (H) 12/26/2018 0753   BUN 12 08/27/2018 1300   CREATININE 0.93 12/26/2018 0753   CREATININE 0.88 12/26/2013 1617   CALCIUM 9.4 12/26/2018 0753   GFRNONAA 78 08/27/2018  1300   GFRAA 90 08/27/2018 1300   Lab Results  Component Value Date   HGBA1C 5.9 12/26/2018   HGBA1C 5.9 (H) 08/27/2018   Lab Results  Component Value Date   INSULIN 8.4 08/27/2018   CBC    Component Value Date/Time   WBC 8.8 12/26/2018 0753   RBC 4.75 12/26/2018 0753   HGB 13.4 12/26/2018 0753   HGB 12.8 08/27/2018 1300   HCT 40.6 12/26/2018 0753   HCT 38.9 08/27/2018 1300   PLT 152.0 12/26/2018 0753   MCV 85.6  12/26/2018 0753   MCV 82 08/27/2018 1300   MCH 26.8 08/27/2018 1300   MCH 27.1 12/26/2013 1617   MCHC 33.1 12/26/2018 0753   RDW 14.4 12/26/2018 0753   RDW 13.1 08/27/2018 1300   LYMPHSABS 2.5 08/27/2018 1300   EOSABS 0.0 08/27/2018 1300   BASOSABS 0.0 08/27/2018 1300   Iron/TIBC/Ferritin/ %Sat No results found for: IRON, TIBC, FERRITIN, IRONPCTSAT Lipid Panel     Component Value Date/Time   CHOL 146 08/27/2018 1300   TRIG 61 08/27/2018 1300   HDL 61 08/27/2018 1300   CHOLHDL 3 01/23/2018 1210   VLDL 12.6 01/23/2018 1210   LDLCALC 73 08/27/2018 1300   Hepatic Function Panel     Component Value Date/Time   PROT 7.2 12/26/2018 0753   PROT 7.1 08/27/2018 1300   ALBUMIN 4.2 12/26/2018 0753   ALBUMIN 4.4 08/27/2018 1300   AST 20 12/26/2018 0753   ALT 21 12/26/2018 0753   ALKPHOS 74 12/26/2018 0753   BILITOT 0.3 12/26/2018 0753   BILITOT 0.3 08/27/2018 1300   BILIDIR 0.1 12/26/2013 1617   IBILI 0.3 12/26/2013 1617      Component Value Date/Time   TSH 1.68 12/26/2018 0753   TSH 0.958 08/27/2018 1300   TSH 1.02 01/23/2018 1210   Results for Alcide CleverMORROW, [name] Y "YVETTE" (MRN [id number]) as of 07/24/2019 12:33  Ref. Range 08/27/2018 13:00  Vitamin D, 25-Hydroxy Latest Ref Range: 30.0 - 100.0 ng/mL 25.4 (L)   I, Marianna Paymentenise Haag, am acting as Energy managertranscriptionist for [employer] [place] [place], FNP  I have reviewed the above documentation for accuracy and completeness, and I agree with the above.  - Celestina Gironda, FNP-C.

## 2019-08-13 ENCOUNTER — Other Ambulatory Visit: Payer: Self-pay

## 2019-08-13 ENCOUNTER — Encounter (INDEPENDENT_AMBULATORY_CARE_PROVIDER_SITE_OTHER): Payer: Self-pay | Admitting: Family Medicine

## 2019-08-13 ENCOUNTER — Telehealth (INDEPENDENT_AMBULATORY_CARE_PROVIDER_SITE_OTHER): Payer: PRIVATE HEALTH INSURANCE | Admitting: Family Medicine

## 2019-08-13 DIAGNOSIS — E669 Obesity, unspecified: Secondary | ICD-10-CM

## 2019-08-13 DIAGNOSIS — Z6830 Body mass index (BMI) 30.0-30.9, adult: Secondary | ICD-10-CM | POA: Diagnosis not present

## 2019-08-13 DIAGNOSIS — R7303 Prediabetes: Secondary | ICD-10-CM | POA: Diagnosis not present

## 2019-08-13 DIAGNOSIS — I10 Essential (primary) hypertension: Secondary | ICD-10-CM | POA: Diagnosis not present

## 2019-08-14 NOTE — Progress Notes (Signed)
Office: [phone]  /  Fax: [phone] TeleHealth Visit:  Jenna Johnson has verbally consented to this TeleHealth visit today. The patient is located at home, the provider is located at the [facility] office. The participants in this visit include the listed provider, patient, husband Jenna Johnson) and any and all parties involved. The visit was conducted today via WebEx.  HPI:   Chief Complaint: OBESITY Jenna Johnson is here to discuss her progress with her obesity treatment plan. She is doing the Category 1 plan 4 days per week and the Category 2 plan 3 days per week and she is following her eating plan approximately 98 % of the time. She states she is riding a bicycle and walking 40 to 60 minutes 5 days per week. Jenna Johnson is doing the Category 1 plan four days per week and the Category 2 plan 3 days per week. She has maintained her weight at 155 pounds. We were unable to weigh the patient today for this TeleHealth visit. She feels as if she has maintained weight (weight 155 lbs 08/13/19) since her last visit. She has lost 14 lbs since starting treatment with [place].  Pre-Diabetes Jenna Johnson has a diagnosis of prediabetes based on her elevated Hgb A1c and was informed this puts her at greater risk of developing diabetes. She is not taking metformin currently and continues to work on diet and exercise to decrease risk of diabetes. She has occasional hunger when she is following the category 1 plan. Lab Results  Component Value Date   HGBA1C 5.9 12/26/2018    Hypertension Jenna Johnson is a 63 y.o. female with hypertension. Her blood pressure is elevated today at home (140's/90's). Jenna Johnson blood pressure is not currently controlled. She is not on medication, and she would like to manage her blood pressure with lifestyle interventions. She has a family history of hypertension. Jenna Johnson is sensitive to salt intake. Jenna Johnson denies chest pain. She  is working weight loss to help control her blood pressure with the goal of decreasing her risk of heart attack and stroke.   ASSESSMENT AND PLAN:  Prediabetes  Essential hypertension  Class 1 obesity with serious comorbidity and body mass index (BMI) of 30.0 to 30.9 in adult, unspecified obesity type  PLAN:  Pre-Diabetes Jenna Johnson will continue to work on weight loss, exercise, and decreasing simple carbohydrates in her diet to help decrease the risk of diabetes. She was informed that eating too many simple carbohydrates or too many calories at one sitting increases the likelihood of GI side effects. Jenna Johnson agreed to continue with the meal plan and follow up with [place] as directed to monitor her progress.  Hypertension We discussed sodium restriction, working on healthy weight loss, and a regular exercise program as the means to achieve improved blood pressure control. Jenna Johnson agreed with this plan and agreed to follow up as directed. We will continue to monitor her blood pressure as well as her progress with the above lifestyle modifications. Jenna Johnson will decrease salt in her diet. Obesity Jenna Johnson is currently in the action stage of change. As such, her goal is to continue with weight loss efforts She has agreed to follow the Category 1 plan 4 days per week and the Category 2 plan 3 days per week Jenna Johnson will continue her current exercise regimen for weight loss and overall health benefits. We discussed the following Behavioral Modification Strategies today: keep a strict food journal and planning for success. Jenna Johnson has agreed to follow up  with our clinic in 3 weeks. She was informed of the importance of frequent follow up visits to maximize her success with intensive lifestyle modifications for her multiple health conditions.  ALLERGIES: No Known Allergies  MEDICATIONS: Current Outpatient Medications on File Prior to Visit  Medication Sig Dispense Refill  .  acetaminophen (TYLENOL) 650 MG CR tablet Take 650 mg by mouth every 8 (eight) hours as needed for pain.    Marland Kitchen aspirin EC 81 MG tablet Take 1 tablet (81 mg total) by mouth daily.    . cholecalciferol (VITAMIN D3) 25 MCG (1000 UT) tablet Take 2,000 Units by mouth daily.    Marland Kitchen ibuprofen (ADVIL,MOTRIN) 200 MG tablet Take 200 mg by mouth every 6 (six) hours as needed.    . loratadine (CLARITIN) 10 MG tablet Take 10 mg by mouth daily as needed.      No current facility-administered medications on file prior to visit.     PAST MEDICAL HISTORY: Past Medical History:  Diagnosis Date  . Allergic state   . Allergy   . Anemia   . Arthritis of knee, right   . Back pain   . Bursitis of both hips   . Chicken pox as a child  . Constipation   . Dermatitis 09/18/2013  . Endometriosis   . GERD (gastroesophageal reflux disease)   . H/O atrial septal defect   . Hip pain, bilateral 09/18/2013  . Hypertension   . IBS (irritable bowel syndrome)   . Joint pain   . Lactose intolerance   . Leg edema   . Measles as a child  . Multiple food allergies    Sugar  . Myalgia and myositis 09/18/2013  . Preventative health care 01/07/2014  . TIA (transient ischemic attack) 2004  . TIA (transient ischemic attack) 09/18/2013  . UTI (urinary tract infection)     PAST SURGICAL HISTORY: Past Surgical History:  Procedure Laterality Date  . ABDOMINAL HYSTERECTOMY  63 yrs old   still has one ovary  . APPENDECTOMY  63 yrs old  . ASD REPAIR  2006  . hole in heart    . RIGHT OOPHORECTOMY     ruptured ovarian cyst at age 63  . tia  surgery 2005    SOCIAL HISTORY: Social History   Tobacco Use  . Smoking status: Never Smoker  . Smokeless tobacco: Never Used  Substance Use Topics  . Alcohol use: Yes    Comment: occasionally  . Drug use: No    FAMILY HISTORY: Family History  Problem Relation Age of Onset  . Multiple sclerosis Mother   . Other Mother        blue tumor, air embolism,  . Hypertension  Mother   . Obesity Mother   . Diabetes Father 60       type 2  . Other Father        fatty liver, lactose intolerant  . Thyroid disease Father   . Cancer Paternal Grandmother 76       breast  . Diabetes Paternal Grandmother   . Other Sister        s/p hysterectomy  . Breast cancer Maternal Grandmother   . Other Sister        s/p hysterectomy    ROS: Review of Systems  Constitutional: Negative for weight loss.  Cardiovascular: Negative for chest pain.  Endo/Heme/Allergies:       Positive for polyphagia    PHYSICAL EXAM: Pt in no acute distress  RECENT LABS  AND TESTS: BMET    Component Value Date/Time   NA 139 12/26/2018 0753   NA 139 08/27/2018 1300   K 4.2 12/26/2018 0753   CL 103 12/26/2018 0753   CO2 29 12/26/2018 0753   GLUCOSE 96 12/26/2018 0753   BUN 26 (H) 12/26/2018 0753   BUN 12 08/27/2018 1300   CREATININE 0.93 12/26/2018 0753   CREATININE 0.88 12/26/2013 1617   CALCIUM 9.4 12/26/2018 0753   GFRNONAA 78 08/27/2018 1300   GFRAA 90 08/27/2018 1300   Lab Results  Component Value Date   HGBA1C 5.9 12/26/2018   HGBA1C 5.9 (H) 08/27/2018   Lab Results  Component Value Date   INSULIN 8.4 08/27/2018   CBC    Component Value Date/Time   WBC 8.8 12/26/2018 0753   RBC 4.75 12/26/2018 0753   HGB 13.4 12/26/2018 0753   HGB 12.8 08/27/2018 1300   HCT 40.6 12/26/2018 0753   HCT 38.9 08/27/2018 1300   PLT 152.0 12/26/2018 0753   MCV 85.6 12/26/2018 0753   MCV 82 08/27/2018 1300   MCH 26.8 08/27/2018 1300   MCH 27.1 12/26/2013 1617   MCHC 33.1 12/26/2018 0753   RDW 14.4 12/26/2018 0753   RDW 13.1 08/27/2018 1300   LYMPHSABS 2.5 08/27/2018 1300   EOSABS 0.0 08/27/2018 1300   BASOSABS 0.0 08/27/2018 1300   Iron/TIBC/Ferritin/ %Sat No results found for: IRON, TIBC, FERRITIN, IRONPCTSAT Lipid Panel     Component Value Date/Time   CHOL 146 08/27/2018 1300   TRIG 61 08/27/2018 1300   HDL 61 08/27/2018 1300   CHOLHDL 3 01/23/2018 1210   VLDL 12.6  01/23/2018 1210   LDLCALC 73 08/27/2018 1300   Hepatic Function Panel     Component Value Date/Time   PROT 7.2 12/26/2018 0753   PROT 7.1 08/27/2018 1300   ALBUMIN 4.2 12/26/2018 0753   ALBUMIN 4.4 08/27/2018 1300   AST 20 12/26/2018 0753   ALT 21 12/26/2018 0753   ALKPHOS 74 12/26/2018 0753   BILITOT 0.3 12/26/2018 0753   BILITOT 0.3 08/27/2018 1300   BILIDIR 0.1 12/26/2013 1617   IBILI 0.3 12/26/2013 1617      Component Value Date/Time   TSH 1.68 12/26/2018 0753   TSH 0.958 08/27/2018 1300   TSH 1.02 01/23/2018 1210     Ref. Range 08/27/2018 13:00  Vitamin D, 25-Hydroxy Latest Ref Range: 30.0 - 100.0 ng/mL 25.4 (L)    I, [place] CraneJoanne Murray, am acting as Energy managertranscriptionist for [place] Daivion Pape, FNP-C  I have reviewed the above documentation for accuracy and completeness, and I agree with the above.  - Mendy Chou, FNP-C.

## 2019-09-03 ENCOUNTER — Telehealth (INDEPENDENT_AMBULATORY_CARE_PROVIDER_SITE_OTHER): Payer: PRIVATE HEALTH INSURANCE | Admitting: Family Medicine

## 2019-09-03 ENCOUNTER — Encounter (INDEPENDENT_AMBULATORY_CARE_PROVIDER_SITE_OTHER): Payer: Self-pay | Admitting: Family Medicine

## 2019-09-03 ENCOUNTER — Other Ambulatory Visit: Payer: Self-pay

## 2019-09-03 DIAGNOSIS — R7303 Prediabetes: Secondary | ICD-10-CM

## 2019-09-03 DIAGNOSIS — Z6830 Body mass index (BMI) 30.0-30.9, adult: Secondary | ICD-10-CM

## 2019-09-03 DIAGNOSIS — E669 Obesity, unspecified: Secondary | ICD-10-CM | POA: Diagnosis not present

## 2019-09-03 NOTE — Progress Notes (Signed)
Office: [phone]  /  Fax: [phone] TeleHealth Visit:  Jenna Johnson has verbally consented to this TeleHealth visit today. The patient is located at home, the provider is located at the [facility] office. The participants in this visit include the listed provider, patient, and the patient's husband, Jenna Johnson. The visit was conducted today via Webex.  HPI:   Chief Complaint: OBESITY Jenna Johnson is here to discuss her progress with her obesity treatment plan. She is on the Category 1 plan alternating with the Category 2 plan and is following her eating plan approximately 95% of the time. She states she is biking/walking 45-60 minutes 2-3 times per week. Jenna Johnson states she has maintained her weight. [place] and was off plan with dinner. She is back on plan and is on Category 1 when she doesn't exercise as much and cat 2 when she does. She has maintained 155 lbs since March 2020. We were unable to weigh the patient today for this TeleHealth visit. She feels as if she has maintained her weight since her last visit. She has lost 14 lbs since starting treatment with us.  Pre-Diabetes Jenna Johnson has a diagnosis of prediabetes based on her elevated Hgb A1c and was informed this puts her at greater risk of developing diabetes. She has occasional craving for crunchy food. She continues to work on diet and exercise to decrease risk of diabetes. She denies nausea or hypoglycemia. No polyphagia. Lab Results  Component Value Date   HGBA1C 5.9 12/26/2018    ASSESSMENT AND PLAN:  Prediabetes  Class 1 obesity with serious comorbidity and body mass index (BMI) of 30.0 to 30.9 in adult, unspecified obesity type  PLAN:  Pre-Diabetes Jenna Johnson will continue to work on weight loss, exercise, and decreasing simple carbohydrates in her diet to help decrease the risk of diabetes. She is not interested in taking metformin.  Jenna Johnson  will continue her meal plan and follow-up with us as directed to monitor her progress.  Obesity Jenna Johnson is currently in the action stage of change. As such, her goal is to continue with weight loss efforts. She has agreed to follow the Category 1 plan alternating with the Category 2 plan. She will have IC performed at her next visit. Jenna Johnson has been instructed to continue her current exercise regimen for weight loss and overall health benefits. We discussed the following Behavioral Modification Strategies today: increasing lean protein intake, increase H20 intake, and planning for success.  Jenna Johnson has agreed to follow-up with our clinic in 3 weeks. She was informed of the importance of frequent follow-up visits to maximize her success with intensive lifestyle modifications for her multiple health conditions.  ALLERGIES: No Known Allergies  MEDICATIONS: Current Outpatient Medications on File Prior to Visit  Medication Sig Dispense Refill   acetaminophen (TYLENOL) 650 MG CR tablet Take 650 mg by mouth every 8 (eight) hours as needed for pain.     aspirin EC 81 MG tablet Take 1 tablet (81 mg total) by mouth daily.     cholecalciferol (VITAMIN D3) 25 MCG (1000 UT) tablet Take 2,000 Units by mouth daily.     ibuprofen (ADVIL,MOTRIN) 200 MG tablet Take 200 mg by mouth every 6 (six) hours as needed.     loratadine (CLARITIN) 10 MG tablet Take 10 mg by mouth daily as needed.      No current facility-administered medications on file prior to visit.     PAST MEDICAL HISTORY: Past  Medical History:  Diagnosis Date   Allergic state    Allergy    Anemia    Arthritis of knee, right    Back pain    Bursitis of both hips    Chicken pox as a child   Constipation    Dermatitis 09/18/2013   Endometriosis    GERD (gastroesophageal reflux disease)    H/O atrial septal defect    Hip pain, bilateral 09/18/2013   Hypertension    IBS (irritable bowel syndrome)     Joint pain    Lactose intolerance    Leg edema    Measles as a child   Multiple food allergies    Sugar   Myalgia and myositis 09/18/2013   Preventative health care 01/07/2014   TIA (transient ischemic attack) 2004   TIA (transient ischemic attack) 09/18/2013   UTI (urinary tract infection)     PAST SURGICAL HISTORY: Past Surgical History:  Procedure Laterality Date   ABDOMINAL HYSTERECTOMY  63 yrs old   still has one ovary   APPENDECTOMY  63 yrs old   ASD REPAIR  2006   hole in heart     RIGHT OOPHORECTOMY     ruptured ovarian cyst at age 38   tia  surgery 2005    SOCIAL HISTORY: Social History   Tobacco Use   Smoking status: Never Smoker   Smokeless tobacco: Never Used  Substance Use Topics   Alcohol use: Yes    Comment: occasionally   Drug use: No    FAMILY HISTORY: Family History  Problem Relation Age of Onset   Multiple sclerosis Mother    Other Mother        blue tumor, air embolism,   Hypertension Mother    Obesity Mother    Diabetes Father 21       type 2   Other Father        fatty liver, lactose intolerant   Thyroid disease Father    Cancer Paternal Grandmother 54       breast   Diabetes Paternal Grandmother    Other Sister        s/p hysterectomy   Breast cancer Maternal Grandmother    Other Sister        s/p hysterectomy   ROS: Review of Systems  Gastrointestinal: Negative for nausea.  Endo/Heme/Allergies:       Negative for hypoglycemia. Negative for polyphagia.   PHYSICAL EXAM: Pt in no acute distress  RECENT LABS AND TESTS: BMET    Component Value Date/Time   NA 139 12/26/2018 0753   NA 139 08/27/2018 1300   K 4.2 12/26/2018 0753   CL 103 12/26/2018 0753   CO2 29 12/26/2018 0753   GLUCOSE 96 12/26/2018 0753   BUN 26 (H) 12/26/2018 0753   BUN 12 08/27/2018 1300   CREATININE 0.93 12/26/2018 0753   CREATININE 0.88 12/26/2013 1617   CALCIUM 9.4 12/26/2018 0753   GFRNONAA 78 08/27/2018 1300    GFRAA 90 08/27/2018 1300   Lab Results  Component Value Date   HGBA1C 5.9 12/26/2018   HGBA1C 5.9 (H) 08/27/2018   Lab Results  Component Value Date   INSULIN 8.4 08/27/2018   CBC    Component Value Date/Time   WBC 8.8 12/26/2018 0753   RBC 4.75 12/26/2018 0753   HGB 13.4 12/26/2018 0753   HGB 12.8 08/27/2018 1300   HCT 40.6 12/26/2018 0753   HCT 38.9 08/27/2018 1300   PLT 152.0 12/26/2018 0753  MCV 85.6 12/26/2018 0753   MCV 82 08/27/2018 1300   MCH 26.8 08/27/2018 1300   MCH 27.1 12/26/2013 1617   MCHC 33.1 12/26/2018 0753   RDW 14.4 12/26/2018 0753   RDW 13.1 08/27/2018 1300   LYMPHSABS 2.5 08/27/2018 1300   EOSABS 0.0 08/27/2018 1300   BASOSABS 0.0 08/27/2018 1300   Iron/TIBC/Ferritin/ %Sat No results found for: IRON, TIBC, FERRITIN, IRONPCTSAT Lipid Panel     Component Value Date/Time   CHOL 146 08/27/2018 1300   TRIG 61 08/27/2018 1300   HDL 61 08/27/2018 1300   CHOLHDL 3 01/23/2018 1210   VLDL 12.6 01/23/2018 1210   LDLCALC 73 08/27/2018 1300   Hepatic Function Panel     Component Value Date/Time   PROT 7.2 12/26/2018 0753   PROT 7.1 08/27/2018 1300   ALBUMIN 4.2 12/26/2018 0753   ALBUMIN 4.4 08/27/2018 1300   AST 20 12/26/2018 0753   ALT 21 12/26/2018 0753   ALKPHOS 74 12/26/2018 0753   BILITOT 0.3 12/26/2018 0753   BILITOT 0.3 08/27/2018 1300   BILIDIR 0.1 12/26/2013 1617   IBILI 0.3 12/26/2013 1617      Component Value Date/Time   TSH 1.68 12/26/2018 0753   TSH 0.958 08/27/2018 1300   TSH 1.02 01/23/2018 1210   Results for Jenna Johnson, Jenna Y "YVETTE" (MRN [id number]) as of 09/03/2019 11:39  Ref. Range 12/26/2018 07:53  VITD Latest Ref Range: 30.00 - 100.00 ng/mL 54.92   I, Michaelene Song, am acting as Location manager for [employer], FNP  I have reviewed the above documentation for accuracy and completeness, and I agree with the above.  - Bryssa Tones, FNP-C.

## 2019-09-24 ENCOUNTER — Encounter (INDEPENDENT_AMBULATORY_CARE_PROVIDER_SITE_OTHER): Payer: Self-pay | Admitting: Family Medicine

## 2019-09-24 ENCOUNTER — Ambulatory Visit (INDEPENDENT_AMBULATORY_CARE_PROVIDER_SITE_OTHER): Payer: PRIVATE HEALTH INSURANCE | Admitting: Family Medicine

## 2019-09-24 ENCOUNTER — Other Ambulatory Visit: Payer: Self-pay

## 2019-09-24 VITALS — BP 125/78 | HR 70 | Temp 97.9°F | Ht 61.0 in | Wt 153.0 lb

## 2019-09-24 DIAGNOSIS — E559 Vitamin D deficiency, unspecified: Secondary | ICD-10-CM

## 2019-09-24 DIAGNOSIS — R0602 Shortness of breath: Secondary | ICD-10-CM

## 2019-09-24 DIAGNOSIS — Z9189 Other specified personal risk factors, not elsewhere classified: Secondary | ICD-10-CM

## 2019-09-24 DIAGNOSIS — E669 Obesity, unspecified: Secondary | ICD-10-CM | POA: Diagnosis not present

## 2019-09-24 DIAGNOSIS — Z6830 Body mass index (BMI) 30.0-30.9, adult: Secondary | ICD-10-CM

## 2019-09-25 NOTE — Progress Notes (Signed)
Office: [phone]  /  Fax: [phone]   HPI:   Chief Complaint: OBESITY Jenna Johnson is here to discuss her progress with her obesity treatment plan. She is on the Category 1 plan and the Category 2 plan and is following her eating plan approximately 95 % of the time. She states she is walking and biking 40 to 45 minutes 2 to 3 times per week. Jenna Johnson is on the Category 2 plan when she exercises quite a bit and the Category 1 plan for the days with less exercise. She is doing well with the plan and denies excessive hunger. Repeat indirect calorimetry today shows a decrease of RMR from 1556 to 1382 (down 174 calories). Her weight is 153 lb (69.4 kg) today and has had a weight loss of 4 pounds over a period of 2 weeks since her last visit. She has lost 18 lbs since starting treatment with [place].  Vitamin D deficiency Jenna Johnson has a diagnosis of vitamin D deficiency. Jenna Johnson is on OTC vitamin D 2,000 IU daily and she denies nausea, vomiting or muscle weakness.  At risk for osteopenia and osteoporosis Jenna Johnson is at higher risk of osteopenia and osteoporosis due to vitamin D deficiency.   Shortness of Breath with exertion Jenna Johnson has shortness of breath with exertion. This has not gotten worse recently.  ASSESSMENT AND PLAN:  Vitamin D deficiency  Shortness of breath on exertion  At risk for osteoporosis  Class 1 obesity with serious comorbidity and body mass index (BMI) of 30.0 to 30.9 in adult, unspecified obesity type - BMI > than 30 at start of program   PLAN:  Vitamin D Deficiency Thea was informed that low vitamin D levels contributes to fatigue and are associated with obesity, breast, and colon cancer. Jaquaya will continue OTC Vit D @2 ,000 IU daily and she will follow up for routine testing of vitamin D, at least 2-3 times per year. She was informed of the risk of over-replacement of vitamin D and agrees to not increase her dose unless she  discusses this with [place] first. When she sees her PCP, she will have her vitamin D level drawn, because she is a hard stick. She only has blood drawn at her PCP.  At risk for osteopenia and osteoporosis Lucilia was given extended  (15 minutes) osteoporosis prevention counseling today. Amandy is at risk for osteopenia and osteoporosis due to her vitamin D deficiency. She was encouraged to take her vitamin D and follow her higher calcium diet and increase strengthening exercise to help strengthen her bones and decrease her risk of osteopenia and osteoporosis.  Shortness of Breath with exertion Jenna Johnson's shortness of breath appears to be obesity related and exercise induced. Indirect calorimetry was checked today and the results showed VO2 of 199 and a REE of 1382. She will continue to work on weight loss and exercise to treat her exercise induced shortness of breath.  Obesity Atlas is currently in the action stage of change. As such, her goal is to continue with weight loss efforts She has agreed to follow the Category 1 plan and the Category 2 plan ( for days with increased exercise). Jenna Johnson will increase strength training and add reps and weight for weight loss and overall health benefits. We discussed the following Behavioral Modification Strategies today: planning for success and better snacking choices  Marisha has agreed to follow up with our clinic in 3 weeks. She was informed of the importance of frequent follow up visits to maximize her success  with intensive lifestyle modifications for her multiple health conditions.  ALLERGIES: No Known Allergies  MEDICATIONS: Current Outpatient Medications on File Prior to Visit  Medication Sig Dispense Refill  . acetaminophen (TYLENOL) 650 MG CR tablet Take 650 mg by mouth every 8 (eight) hours as needed for pain.    Marland Kitchen. aspirin EC 81 MG tablet Take 1 tablet (81 mg total) by mouth daily.    . cholecalciferol (VITAMIN D3)  25 MCG (1000 UT) tablet Take 2,000 Units by mouth daily.    Marland Kitchen. ibuprofen (ADVIL,MOTRIN) 200 MG tablet Take 200 mg by mouth every 6 (six) hours as needed.    . loratadine (CLARITIN) 10 MG tablet Take 10 mg by mouth daily as needed.      No current facility-administered medications on file prior to visit.     PAST MEDICAL HISTORY: Past Medical History:  Diagnosis Date  . Allergic state   . Allergy   . Anemia   . Arthritis of knee, right   . Back pain   . Bursitis of both hips   . Chicken pox as a child  . Constipation   . Dermatitis 09/18/2013  . Endometriosis   . GERD (gastroesophageal reflux disease)   . H/O atrial septal defect   . Hip pain, bilateral 09/18/2013  . Hypertension   . IBS (irritable bowel syndrome)   . Joint pain   . Lactose intolerance   . Leg edema   . Measles as a child  . Multiple food allergies    Sugar  . Myalgia and myositis 09/18/2013  . Preventative health care 01/07/2014  . TIA (transient ischemic attack) 2004  . TIA (transient ischemic attack) 09/18/2013  . UTI (urinary tract infection)     PAST SURGICAL HISTORY: Past Surgical History:  Procedure Laterality Date  . ABDOMINAL HYSTERECTOMY  63 yrs old   still has one ovary  . APPENDECTOMY  63 yrs old  . ASD REPAIR  2006  . hole in heart    . RIGHT OOPHORECTOMY     ruptured ovarian cyst at age 63  . tia  surgery 2005    SOCIAL HISTORY: Social History   Tobacco Use  . Smoking status: Never Smoker  . Smokeless tobacco: Never Used  Substance Use Topics  . Alcohol use: Yes    Comment: occasionally  . Drug use: No    FAMILY HISTORY: Family History  Problem Relation Age of Onset  . Multiple sclerosis Mother   . Other Mother        blue tumor, air embolism,  . Hypertension Mother   . Obesity Mother   . Diabetes Father [id number]       type 2  . Other Father        fatty liver, lactose intolerant  . Thyroid disease Father   . Cancer Paternal Grandmother [id number]       breast  . Diabetes  Paternal Grandmother   . Other Sister        s/p hysterectomy  . Breast cancer Maternal Grandmother   . Other Sister        s/p hysterectomy    ROS: Review of Systems  Constitutional: Positive for weight loss.  Respiratory: Positive for shortness of breath (on exertion).     PHYSICAL EXAM: Blood pressure 125/78, pulse 70, temperature 97.9 F (36.6 C), temperature source Oral, height 5\' 1"  (1.549 m), weight 153 lb (69.4 kg), SpO2 100 %. Body mass index is 28.91 kg/m. Physical Exam Vitals signs  reviewed.  Constitutional:      Appearance: Normal appearance. She is well-developed. She is obese.  Cardiovascular:     Rate and Rhythm: Normal rate.  Pulmonary:     Effort: Pulmonary effort is normal.  Musculoskeletal: Normal range of motion.  Skin:    General: Skin is warm and dry.  Neurological:     Mental Status: She is alert and oriented to person, place, and time.  Psychiatric:        Mood and Affect: Mood normal.        Behavior: Behavior normal.     RECENT LABS AND TESTS: BMET    Component Value Date/Time   NA 139 12/26/2018 0753   NA 139 08/27/2018 1300   K 4.2 12/26/2018 0753   CL 103 12/26/2018 0753   CO2 29 12/26/2018 0753   GLUCOSE 96 12/26/2018 0753   BUN 26 (H) 12/26/2018 0753   BUN 12 08/27/2018 1300   CREATININE 0.93 12/26/2018 0753   CREATININE 0.88 12/26/2013 1617   CALCIUM 9.4 12/26/2018 0753   GFRNONAA 78 08/27/2018 1300   GFRAA 90 08/27/2018 1300   Lab Results  Component Value Date   HGBA1C 5.9 12/26/2018   HGBA1C 5.9 (H) 08/27/2018   Lab Results  Component Value Date   INSULIN 8.4 08/27/2018   CBC    Component Value Date/Time   WBC 8.8 12/26/2018 0753   RBC 4.75 12/26/2018 0753   HGB 13.4 12/26/2018 0753   HGB 12.8 08/27/2018 1300   HCT 40.6 12/26/2018 0753   HCT 38.9 08/27/2018 1300   PLT 152.0 12/26/2018 0753   MCV 85.6 12/26/2018 0753   MCV 82 08/27/2018 1300   MCH 26.8 08/27/2018 1300   MCH 27.1 12/26/2013 1617   MCHC 33.1  12/26/2018 0753   RDW 14.4 12/26/2018 0753   RDW 13.1 08/27/2018 1300   LYMPHSABS 2.5 08/27/2018 1300   EOSABS 0.0 08/27/2018 1300   BASOSABS 0.0 08/27/2018 1300   Iron/TIBC/Ferritin/ %Sat No results found for: IRON, TIBC, FERRITIN, IRONPCTSAT Lipid Panel     Component Value Date/Time   CHOL 146 08/27/2018 1300   TRIG 61 08/27/2018 1300   HDL 61 08/27/2018 1300   CHOLHDL 3 01/23/2018 1210   VLDL 12.6 01/23/2018 1210   LDLCALC 73 08/27/2018 1300   Hepatic Function Panel     Component Value Date/Time   PROT 7.2 12/26/2018 0753   PROT 7.1 08/27/2018 1300   ALBUMIN 4.2 12/26/2018 0753   ALBUMIN 4.4 08/27/2018 1300   AST 20 12/26/2018 0753   ALT 21 12/26/2018 0753   ALKPHOS 74 12/26/2018 0753   BILITOT 0.3 12/26/2018 0753   BILITOT 0.3 08/27/2018 1300   BILIDIR 0.1 12/26/2013 1617   IBILI 0.3 12/26/2013 1617      Component Value Date/Time   TSH 1.68 12/26/2018 0753   TSH 0.958 08/27/2018 1300   TSH 1.02 01/23/2018 1210     Ref. Range 12/26/2018 07:53  VITD Latest Ref Range: 30.00 - 100.00 ng/mL 54.92    OBESITY BEHAVIORAL INTERVENTION VISIT  Today's visit was # 20   Starting weight: 171 lbs Starting date: 08/27/2018 Today's weight : 153 lbs Today's date: 09/24/2019 Total lbs lost to date: 18    09/24/2019  Height 5\' 1"  (1.549 m)  Weight 153 lb (69.4 kg)  BMI (Calculated) 28.92  BLOOD PRESSURE - SYSTOLIC 125  BLOOD PRESSURE - DIASTOLIC 78   Body Fat % 31 %  Total Body Water (lbs) 62.4 lbs  RMR 1382  ASK: We discussed the diagnosis of obesity with [name] today and Jenna Johnson agreed to give [place] permission to discuss obesity behavioral modification therapy today.  ASSESS: Porschea has the diagnosis of obesity and her BMI today is 28.92 [place] is in the action stage of change   ADVISE: Lesette was educated on the multiple health risks of obesity as well as the benefit of weight loss to improve her health. She was advised of the  need for long term treatment and the importance of lifestyle modifications to improve her current health and to decrease her risk of future health problems.  AGREE: Multiple dietary modification options and treatment options were discussed and  Jenna Johnson agreed to follow the recommendations documented in the above note.  ARRANGE: Connor was educated on the importance of frequent visits to treat obesity as outlined per CMS and USPSTF guidelines and agreed to schedule her next follow up appointment today.  Cristi Loron, am acting as Energy manager for [place], FNP-C.  I have reviewed the above documentation for accuracy and completeness, and I agree with the above.  - Treysean Petruzzi, FNP-C.

## 2019-09-26 ENCOUNTER — Encounter (INDEPENDENT_AMBULATORY_CARE_PROVIDER_SITE_OTHER): Payer: Self-pay | Admitting: Family Medicine

## 2019-10-09 ENCOUNTER — Telehealth: Payer: Self-pay | Admitting: *Deleted

## 2019-10-09 DIAGNOSIS — E669 Obesity, unspecified: Secondary | ICD-10-CM

## 2019-10-09 DIAGNOSIS — E559 Vitamin D deficiency, unspecified: Secondary | ICD-10-CM

## 2019-10-09 DIAGNOSIS — Z6831 Body mass index (BMI) 31.0-31.9, adult: Secondary | ICD-10-CM

## 2019-10-09 DIAGNOSIS — E66811 Obesity, class 1: Secondary | ICD-10-CM

## 2019-10-09 DIAGNOSIS — I10 Essential (primary) hypertension: Secondary | ICD-10-CM

## 2019-10-09 NOTE — Telephone Encounter (Signed)
Looks like patient goes to [facility] and they wanted to get these test done and patient only wants [place] to draw her blood because she is a hard stick.  Are you ok with ordering these labs?

## 2019-10-09 NOTE — Telephone Encounter (Signed)
Copied from [place] [id number]. Topic: General - Inquiry >> Oct 08, 2019  3:23 PM Richardo Priest, NT wrote: Reason for CRM: Pt called for a basic blood panel, A1C, and Vitamin D lab work to be placed. Please advise and call back when ready to schedule.

## 2019-10-09 NOTE — Telephone Encounter (Signed)
Yes please order noted labs and add a cbc

## 2019-10-10 NOTE — Telephone Encounter (Signed)
Labs ordered.

## 2019-10-15 ENCOUNTER — Telehealth (INDEPENDENT_AMBULATORY_CARE_PROVIDER_SITE_OTHER): Payer: PRIVATE HEALTH INSURANCE | Admitting: Family Medicine

## 2019-10-15 ENCOUNTER — Encounter (INDEPENDENT_AMBULATORY_CARE_PROVIDER_SITE_OTHER): Payer: Self-pay | Admitting: Family Medicine

## 2019-10-15 ENCOUNTER — Other Ambulatory Visit: Payer: Self-pay

## 2019-10-15 DIAGNOSIS — R7303 Prediabetes: Secondary | ICD-10-CM

## 2019-10-15 DIAGNOSIS — Z6830 Body mass index (BMI) 30.0-30.9, adult: Secondary | ICD-10-CM | POA: Diagnosis not present

## 2019-10-15 DIAGNOSIS — E669 Obesity, unspecified: Secondary | ICD-10-CM | POA: Diagnosis not present

## 2019-10-16 ENCOUNTER — Other Ambulatory Visit (INDEPENDENT_AMBULATORY_CARE_PROVIDER_SITE_OTHER): Payer: 59

## 2019-10-16 ENCOUNTER — Other Ambulatory Visit: Payer: Self-pay

## 2019-10-16 DIAGNOSIS — I10 Essential (primary) hypertension: Secondary | ICD-10-CM | POA: Diagnosis not present

## 2019-10-16 DIAGNOSIS — Z6831 Body mass index (BMI) 31.0-31.9, adult: Secondary | ICD-10-CM

## 2019-10-16 DIAGNOSIS — E559 Vitamin D deficiency, unspecified: Secondary | ICD-10-CM | POA: Diagnosis not present

## 2019-10-16 DIAGNOSIS — E669 Obesity, unspecified: Secondary | ICD-10-CM | POA: Diagnosis not present

## 2019-10-16 LAB — CBC
HCT: 41.4 % (ref 36.0–46.0)
Hemoglobin: 13.6 g/dL (ref 12.0–15.0)
MCHC: 32.8 g/dL (ref 30.0–36.0)
MCV: 86.3 fl (ref 78.0–100.0)
Platelets: 153 10*3/uL (ref 150.0–400.0)
RBC: 4.8 Mil/uL (ref 3.87–5.11)
RDW: 13.6 % (ref 11.5–15.5)
WBC: 7.8 10*3/uL (ref 4.0–10.5)

## 2019-10-16 LAB — COMPREHENSIVE METABOLIC PANEL
ALT: 19 U/L (ref 0–35)
AST: 18 U/L (ref 0–37)
Albumin: 4.2 g/dL (ref 3.5–5.2)
Alkaline Phosphatase: 63 U/L (ref 39–117)
BUN: 21 mg/dL (ref 6–23)
CO2: 26 mEq/L (ref 19–32)
Calcium: 9.3 mg/dL (ref 8.4–10.5)
Chloride: 104 mEq/L (ref 96–112)
Creatinine, Ser: 0.9 mg/dL (ref 0.40–1.20)
GFR: 76.9 mL/min (ref >60.00)
Glucose, Bld: 86 mg/dL (ref 70–99)
Potassium: 4.3 mEq/L (ref 3.5–5.1)
Sodium: 140 mEq/L (ref 135–145)
Total Bilirubin: 0.4 mg/dL (ref 0.2–1.2)
Total Protein: 7.2 g/dL (ref 6.0–8.3)

## 2019-10-16 LAB — TSH: TSH: 1.93 u[IU]/mL (ref 0.35–4.50)

## 2019-10-16 LAB — VITAMIN D 25 HYDROXY (VIT D DEFICIENCY, FRACTURES): VITD: 99.1 ng/mL (ref 30.00–100.00)

## 2019-10-16 LAB — HEMOGLOBIN A1C: Hgb A1c MFr Bld: 5.8 % (ref 4.6–6.5)

## 2019-10-16 NOTE — Progress Notes (Signed)
Office: [phone]  /  Fax: [phone] TeleHealth Visit:  Jenna Johnson has verbally consented to this TeleHealth visit today. The patient is located at home, the provider is located at the [facility] office. The participants in this visit include the listed provider and patient and any and all parties involved. The visit was conducted today via WebEx.  HPI:   Chief Complaint: OBESITY Jenna Johnson is here to discuss her progress with her obesity treatment plan. She is on the Category 1 plan and the Category 2 plan  (days with more exercise) and she is following her eating plan approximately 95 % of the time. She states she is doing calisthenics, biking and walking 5 times per week. Jenna Johnson reports that she has maintained her weight. Her husband feels he may be plating too much meat for her. She has had indulgences in the last week of cakes and pies. Her goal weight is 140 to 145 pounds. She is training for a 70 mile bike ride next October. We were unable to weigh the patient today for this TeleHealth visit. She feels as if she has maintained weight since her last visit (weight not reported). She has lost 18 lbs since starting treatment with [place].  Pre-Diabetes Jenna Johnson has a diagnosis of prediabetes based on her elevated Hgb A1c and was informed this puts her at greater risk of developing diabetes. Her last A1c was at 5.9 on 12/26/18. She is not taking metformin currently and continues to work on diet and exercise to decrease risk of diabetes. She denies polyphagia.  ASSESSMENT AND PLAN:  Prediabetes  Class 1 obesity with serious comorbidity and body mass index (BMI) of 30.0 to 30.9 in adult, unspecified obesity type - BMI > 30 at start of program  PLAN:  Pre-Diabetes Jenna Johnson will continue to work on weight loss, exercise, and decreasing simple carbohydrates in her diet to help decrease the risk of diabetes. She was informed that eating too many simple  carbohydrates or too many calories at one sitting increases the likelihood of GI side effects. Jenna Johnson will have her A1c checked tomorrow. She will have labs drawn at her PCP office because lab personnel at our office have  been unsuccessful with lab draw in the past. Jenna Johnson agreed to follow up with [place] as directed to monitor her progress.  Obesity Jenna Johnson is currently in the action stage of change. As such, her goal is to continue with weight loss efforts She has agreed to follow the Category 1 plan or the Category 2 plan (days with more exercise). Jenna Johnson will continue her current exercise regimen for weight loss and overall health benefits. We discussed the following Behavioral Modification Strategies today: planning for success and decreasing simple carbohydrates   Jenna Johnson has agreed to follow up with our clinic in 3 weeks. She was informed of the importance of frequent follow up visits to maximize her success with intensive lifestyle modifications for her multiple health conditions.  ALLERGIES: No Known Allergies  MEDICATIONS: Current Outpatient Medications on File Prior to Visit  Medication Sig Dispense Refill  . acetaminophen (TYLENOL) 650 MG CR tablet Take 650 mg by mouth every 8 (eight) hours as needed for pain.    Marland Kitchen aspirin EC 81 MG tablet Take 1 tablet (81 mg total) by mouth daily.    . cholecalciferol (VITAMIN D3) 25 MCG (1000 UT) tablet Take 2,000 Units by mouth daily.    Marland Kitchen ibuprofen (ADVIL,MOTRIN) 200 MG tablet Take 200 mg by mouth every  6 (six) hours as needed.    . loratadine (CLARITIN) 10 MG tablet Take 10 mg by mouth daily as needed.      No current facility-administered medications on file prior to visit.     PAST MEDICAL HISTORY: Past Medical History:  Diagnosis Date  . Allergic state   . Allergy   . Anemia   . Arthritis of knee, right   . Back pain   . Bursitis of both hips   . Chicken pox as a child  . Constipation   .  Dermatitis 09/18/2013  . Endometriosis   . GERD (gastroesophageal reflux disease)   . H/O atrial septal defect   . Hip pain, bilateral 09/18/2013  . Hypertension   . IBS (irritable bowel syndrome)   . Joint pain   . Lactose intolerance   . Leg edema   . Measles as a child  . Multiple food allergies    Sugar  . Myalgia and myositis 09/18/2013  . Preventative health care 01/07/2014  . TIA (transient ischemic attack) 2004  . TIA (transient ischemic attack) 09/18/2013  . UTI (urinary tract infection)     PAST SURGICAL HISTORY: Past Surgical History:  Procedure Laterality Date  . ABDOMINAL HYSTERECTOMY  63 yrs old   still has one ovary  . APPENDECTOMY  63 yrs old  . ASD REPAIR  2006  . hole in heart    . RIGHT OOPHORECTOMY     ruptured ovarian cyst at age 63  . tia  surgery 2005    SOCIAL HISTORY: Social History   Tobacco Use  . Smoking status: Never Smoker  . Smokeless tobacco: Never Used  Substance Use Topics  . Alcohol use: Yes    Comment: occasionally  . Drug use: No    FAMILY HISTORY: Family History  Problem Relation Age of Onset  . Multiple sclerosis Mother   . Other Mother        blue tumor, air embolism,  . Hypertension Mother   . Obesity Mother   . Diabetes Father [id number]       type 2  . Other Father        fatty liver, lactose intolerant  . Thyroid disease Father   . Cancer Paternal Grandmother 170       breast  . Diabetes Paternal Grandmother   . Other Sister        s/p hysterectomy  . Breast cancer Maternal Grandmother   . Other Sister        s/p hysterectomy    ROS: Review of Systems  Constitutional: Negative for weight loss.  Endo/Heme/Allergies:       Negative for polyphagia    PHYSICAL EXAM: Pt in no acute distress  RECENT LABS AND TESTS: BMET    Component Value Date/Time   NA 139 12/26/2018 0753   NA 139 08/27/2018 1300   K 4.2 12/26/2018 0753   CL 103 12/26/2018 0753   CO2 29 12/26/2018 0753   GLUCOSE 96 12/26/2018 0753    BUN 26 (H) 12/26/2018 0753   BUN 12 08/27/2018 1300   CREATININE 0.93 12/26/2018 0753   CREATININE 0.88 12/26/2013 1617   CALCIUM 9.4 12/26/2018 0753   GFRNONAA 78 08/27/2018 1300   GFRAA 90 08/27/2018 1300   Lab Results  Component Value Date   HGBA1C 5.9 12/26/2018   HGBA1C 5.9 (H) 08/27/2018   Lab Results  Component Value Date   INSULIN 8.4 08/27/2018   CBC    Component Value  Date/Time   WBC 8.8 12/26/2018 0753   RBC 4.75 12/26/2018 0753   HGB 13.4 12/26/2018 0753   HGB 12.8 08/27/2018 1300   HCT 40.6 12/26/2018 0753   HCT 38.9 08/27/2018 1300   PLT 152.0 12/26/2018 0753   MCV 85.6 12/26/2018 0753   MCV 82 08/27/2018 1300   MCH 26.8 08/27/2018 1300   MCH 27.1 12/26/2013 1617   MCHC 33.1 12/26/2018 0753   RDW 14.4 12/26/2018 0753   RDW 13.1 08/27/2018 1300   LYMPHSABS 2.5 08/27/2018 1300   EOSABS 0.0 08/27/2018 1300   BASOSABS 0.0 08/27/2018 1300   Iron/TIBC/Ferritin/ %Sat No results found for: IRON, TIBC, FERRITIN, IRONPCTSAT Lipid Panel     Component Value Date/Time   CHOL 146 08/27/2018 1300   TRIG 61 08/27/2018 1300   HDL 61 08/27/2018 1300   CHOLHDL 3 01/23/2018 1210   VLDL 12.6 01/23/2018 1210   LDLCALC 73 08/27/2018 1300   Hepatic Function Panel     Component Value Date/Time   PROT 7.2 12/26/2018 0753   PROT 7.1 08/27/2018 1300   ALBUMIN 4.2 12/26/2018 0753   ALBUMIN 4.4 08/27/2018 1300   AST 20 12/26/2018 0753   ALT 21 12/26/2018 0753   ALKPHOS 74 12/26/2018 0753   BILITOT 0.3 12/26/2018 0753   BILITOT 0.3 08/27/2018 1300   BILIDIR 0.1 12/26/2013 1617   IBILI 0.3 12/26/2013 1617      Component Value Date/Time   TSH 1.68 12/26/2018 0753   TSH 0.958 08/27/2018 1300   TSH 1.02 01/23/2018 1210     Ref. Range 08/27/2018 13:00  Vitamin D, 25-Hydroxy Latest Ref Range: 30.0 - 100.0 ng/mL 25.4 (L)    I, Doreene Nest, am acting as Location manager for [employer], FNP-C  I have reviewed the above documentation for accuracy and  completeness, and I agree with the above.  - Lacresia Darwish, FNP-C.

## 2019-11-05 ENCOUNTER — Ambulatory Visit (INDEPENDENT_AMBULATORY_CARE_PROVIDER_SITE_OTHER): Payer: PRIVATE HEALTH INSURANCE | Admitting: Family Medicine

## 2019-11-05 ENCOUNTER — Other Ambulatory Visit: Payer: Self-pay

## 2019-11-05 ENCOUNTER — Encounter (INDEPENDENT_AMBULATORY_CARE_PROVIDER_SITE_OTHER): Payer: Self-pay | Admitting: Family Medicine

## 2019-11-05 DIAGNOSIS — R7303 Prediabetes: Secondary | ICD-10-CM | POA: Diagnosis not present

## 2019-11-05 DIAGNOSIS — E559 Vitamin D deficiency, unspecified: Secondary | ICD-10-CM | POA: Diagnosis not present

## 2019-11-05 DIAGNOSIS — E669 Obesity, unspecified: Secondary | ICD-10-CM | POA: Diagnosis not present

## 2019-11-05 DIAGNOSIS — Z6830 Body mass index (BMI) 30.0-30.9, adult: Secondary | ICD-10-CM | POA: Diagnosis not present

## 2019-11-06 NOTE — Progress Notes (Addendum)
Office: [phone]  /  Fax: [phone] TeleHealth Visit:  Jenna Johnson has verbally consented to this TeleHealth visit today. The patient is located at home, the provider is located at the [facility] office. The participants in this visit include the listed provider and patient. The visit was conducted today via webex.  HPI:   Chief Complaint: OBESITY Jenna Johnson is here to discuss her progress with her obesity treatment plan. She is on the Category 1 plan or follow the Category 2 plan when increasing exercise and is following her eating plan approximately 95 % of the time. She states she is doing cardio for 40-45 minutes 3-4 times per week. Jenna Johnson reports she has gained a few lbs. She feels this is related to Thanksgiving, a recent vacation, and a birthday celebration. She is now back on track. She states her weight is 157 lbs today and blood pressure was 141/90. We were unable to weigh the patient today for this TeleHealth visit. She feels as if she has gained 2 lbs since her last visit. She has lost 18 lbs since starting treatment with us.  Pre-Diabetes Jenna Johnson has a diagnosis of pre-diabetes based on her elevated Hgb A1c and was informed this puts her at greater risk of developing diabetes. She is not taking metformin currently and continues to work on diet and exercise to decrease risk of diabetes. She denies polyphagia. Lab Results  Component Value Date   HGBA1C 5.8 10/16/2019    Vitamin D Deficiency Jenna Johnson has a diagnosis of vitamin D deficiency. She has been taking OTC Vit D 10,000 IU daily. She was told to take 2000 IU but bought the wrong dose.  Last Vit D level was 99 on 10/16/2019. She denies nausea, vomiting or muscle weakness.  ASSESSMENT AND PLAN:  Prediabetes  Vitamin D deficiency  Class 1 obesity with serious comorbidity and body mass index (BMI) of 30.0 to 30.9 in adult, unspecified obesity type - Starting BMI greater then 30   PLAN:  Pre-Diabetes Jenna Johnson will continue her meal plan, and will continue to work on weight loss, exercise, and decreasing simple carbohydrates in her diet to help decrease the risk of diabetes. Jenna Johnson agrees to follow up with us as directed to monitor her progress.  Vitamin D Deficiency Jenna Johnson was informed that low vitamin D levels contributes to fatigue and are associated with obesity, breast, and colon cancer. Jenna Johnson agrees to decrease OTC Vit D to 5,000 IU 3 days per week and will follow up for routine testing of vitamin D, at least 2-3 times per year. She was informed of the risk of over-replacement of vitamin D and agrees to not increase her dose unless she discusses this with us first. Jenna Johnson agrees to follow up with our clinic in 5 weeks.  Obesity Jenna Johnson is currently in the action stage of change. As such, her goal is to continue with weight loss efforts She has agreed to follow the Category 1 plan or follow the Category 2 plan when increasing exercise Jenna Johnson has been instructed to work up to a goal of 150 minutes of combined cardio and strengthening exercise per week or as above for weight loss and overall health benefits. We discussed the following Behavioral Modification Strategies today: decreasing simple carbohydrates, travel eating strategies, and planning for success    Jenna Johnson has agreed to follow up with our clinic in 5 weeks. She was informed of the importance of frequent follow up visits to maximize her success with intensive lifestyle  modifications for her multiple health conditions.  ALLERGIES: No Known Allergies  MEDICATIONS: Current Outpatient Medications on File Prior to Visit  Medication Sig Dispense Refill  . acetaminophen (TYLENOL) 650 MG CR tablet Take 650 mg by mouth every 8 (eight) hours as needed for pain.    Marland Kitchen aspirin EC 81 MG tablet Take 1 tablet (81 mg total) by mouth daily.    . cholecalciferol (VITAMIN D3) 25 MCG  (1000 UT) tablet Take 2,000 Units by mouth daily.    Marland Kitchen ibuprofen (ADVIL,MOTRIN) 200 MG tablet Take 200 mg by mouth every 6 (six) hours as needed.    . loratadine (CLARITIN) 10 MG tablet Take 10 mg by mouth daily as needed.      No current facility-administered medications on file prior to visit.     PAST MEDICAL HISTORY: Past Medical History:  Diagnosis Date  . Allergic state   . Allergy   . Anemia   . Arthritis of knee, right   . Back pain   . Bursitis of both hips   . Chicken pox as a child  . Constipation   . Dermatitis 09/18/2013  . Endometriosis   . GERD (gastroesophageal reflux disease)   . H/O atrial septal defect   . Hip pain, bilateral 09/18/2013  . Hypertension   . IBS (irritable bowel syndrome)   . Joint pain   . Lactose intolerance   . Leg edema   . Measles as a child  . Multiple food allergies    Sugar  . Myalgia and myositis 09/18/2013  . Preventative health care 01/07/2014  . TIA (transient ischemic attack) 2004  . TIA (transient ischemic attack) 09/18/2013  . UTI (urinary tract infection)     PAST SURGICAL HISTORY: Past Surgical History:  Procedure Laterality Date  . ABDOMINAL HYSTERECTOMY  63 yrs old   still has one ovary  . APPENDECTOMY  63 yrs old  . ASD REPAIR  2006  . hole in heart    . RIGHT OOPHORECTOMY     ruptured ovarian cyst at age 65  . tia  surgery 2005    SOCIAL HISTORY: Social History   Tobacco Use  . Smoking status: Never Smoker  . Smokeless tobacco: Never Used  Substance Use Topics  . Alcohol use: Yes    Comment: occasionally  . Drug use: No    FAMILY HISTORY: Family History  Problem Relation Age of Onset  . Multiple sclerosis Mother   . Other Mother        blue tumor, air embolism,  . Hypertension Mother   . Obesity Mother   . Diabetes Father 67       type 2  . Other Father        fatty liver, lactose intolerant  . Thyroid disease Father   . Cancer Paternal Grandmother 51       breast  . Diabetes Paternal  Grandmother   . Other Sister        s/p hysterectomy  . Breast cancer Maternal Grandmother   . Other Sister        s/p hysterectomy    ROS: Review of Systems  Constitutional: Negative for weight loss.  Gastrointestinal: Negative for nausea and vomiting.  Musculoskeletal:       Negative muscle weakness  Endo/Heme/Allergies:       Negative polyphagia    PHYSICAL EXAM: Pt in no acute distress  RECENT LABS AND TESTS: BMET    Component Value Date/Time   NA 140  10/16/2019 0838   NA 139 08/27/2018 1300   K 4.3 10/16/2019 0838   CL 104 10/16/2019 0838   CO2 26 10/16/2019 0838   GLUCOSE 86 10/16/2019 0838   BUN 21 10/16/2019 0838   BUN 12 08/27/2018 1300   CREATININE 0.90 10/16/2019 0838   CREATININE 0.88 12/26/2013 1617   CALCIUM 9.3 10/16/2019 0838   GFRNONAA 78 08/27/2018 1300   GFRAA 90 08/27/2018 1300   Lab Results  Component Value Date   HGBA1C 5.8 10/16/2019   HGBA1C 5.9 12/26/2018   HGBA1C 5.9 (H) 08/27/2018   Lab Results  Component Value Date   INSULIN 8.4 08/27/2018   CBC    Component Value Date/Time   WBC 7.8 10/16/2019 0838   RBC 4.80 10/16/2019 0838   HGB 13.6 10/16/2019 0838   HGB 12.8 08/27/2018 1300   HCT 41.4 10/16/2019 0838   HCT 38.9 08/27/2018 1300   PLT 153.0 10/16/2019 0838   MCV 86.3 10/16/2019 0838   MCV 82 08/27/2018 1300   MCH 26.8 08/27/2018 1300   MCH 27.1 12/26/2013 1617   MCHC 32.8 10/16/2019 0838   RDW 13.6 10/16/2019 0838   RDW 13.1 08/27/2018 1300   LYMPHSABS 2.5 08/27/2018 1300   EOSABS 0.0 08/27/2018 1300   BASOSABS 0.0 08/27/2018 1300   Iron/TIBC/Ferritin/ %Sat No results found for: IRON, TIBC, FERRITIN, IRONPCTSAT Lipid Panel     Component Value Date/Time   CHOL 146 08/27/2018 1300   TRIG 61 08/27/2018 1300   HDL 61 08/27/2018 1300   CHOLHDL 3 01/23/2018 1210   VLDL 12.6 01/23/2018 1210   LDLCALC 73 08/27/2018 1300   Hepatic Function Panel     Component Value Date/Time   PROT 7.2 10/16/2019 0838   PROT  7.1 08/27/2018 1300   ALBUMIN 4.2 10/16/2019 0838   ALBUMIN 4.4 08/27/2018 1300   AST 18 10/16/2019 0838   ALT 19 10/16/2019 0838   ALKPHOS 63 10/16/2019 0838   BILITOT 0.4 10/16/2019 0838   BILITOT 0.3 08/27/2018 1300   BILIDIR 0.1 12/26/2013 1617   IBILI 0.3 12/26/2013 1617      Component Value Date/Time   TSH 1.93 10/16/2019 0838   TSH 1.68 12/26/2018 0753   TSH 0.958 08/27/2018 1300      I, Burt Knack, am acting as Energy manager for [place], FNP-C  I have reviewed the above documentation for accuracy and completeness, and I agree with the above.  - Imir Brumbach, FNP-C.

## 2019-11-07 ENCOUNTER — Encounter (INDEPENDENT_AMBULATORY_CARE_PROVIDER_SITE_OTHER): Payer: Self-pay | Admitting: Family Medicine

## 2019-12-10 ENCOUNTER — Telehealth (INDEPENDENT_AMBULATORY_CARE_PROVIDER_SITE_OTHER): Payer: PRIVATE HEALTH INSURANCE | Admitting: Family Medicine

## 2019-12-10 ENCOUNTER — Other Ambulatory Visit: Payer: Self-pay

## 2019-12-10 DIAGNOSIS — Z6830 Body mass index (BMI) 30.0-30.9, adult: Secondary | ICD-10-CM | POA: Diagnosis not present

## 2019-12-10 DIAGNOSIS — R7303 Prediabetes: Secondary | ICD-10-CM

## 2019-12-10 DIAGNOSIS — E669 Obesity, unspecified: Secondary | ICD-10-CM

## 2019-12-10 DIAGNOSIS — Z7189 Other specified counseling: Secondary | ICD-10-CM | POA: Diagnosis not present

## 2019-12-11 NOTE — Progress Notes (Signed)
TeleHealth Visit:  Due to the COVID-19 pandemic, this visit was completed with telemedicine (audio/video) technology to reduce patient and provider exposure as well as to preserve personal protective equipment.   Jenna Johnson has verbally consented to this TeleHealth visit. The patient is located at home, the provider is located at the [facility] office. The participants in this visit include the listed provider, patient and husband Jenna Johnson and any and all parties involved. The visit was conducted today via WebEx.  Chief Complaint: OBESITY LUCCA GREGGS is here to discuss her progress with her obesity treatment plan along with follow-up of her obesity related diagnoses. Jenna Johnson is on the Category 1 Plan and Category 2 Plan and states she is following her eating plan approximately 90% of the time. Jenna Johnson states she is walking and biking 40 minutes 5 times per week.  Today's visit was #: 23 Starting weight: 171 lbs Starting date: 08/27/2018  Interim History: She reports gaining 4 pounds since the last visit. She had been up 5 pounds, but she has lost 1 pound. She did indulge in cake over the holidays; however she did eat all of the food on the plan. She would like to discuss the safety of fasting for spiritual reasons for 21 days.    Subjective:   Prediabetes Her last A1c was at 5.8 (10/16/19). She is not on metformin. [name] denies polyphagia.  Lab Results  Component Value Date   HGBA1C 5.8 10/16/2019    Advice given about COVID virus by phone. Jenna Johnson and her husband are very concerned about her risk for poor outcome should she become infected with COVID. She is at increased risk of poor outcome based on her chronic diseases: hypertension and history of TIA. She is also currently prediabetic.  Jenna Johnson and her husband Jenna Johnson have been quite careful to practice social distancing and wear masks when out in public.   Assessment/Plan:   Prediabetes  Advice given about COVID-19 virus by telephone  Class 1 obesity with serious comorbidity and body mass index (BMI) of 30.0 to 30.9 in adult, unspecified obesity type - BMI> 30 at start of program   Prediabetes [name] will continue with the meal plan and she will continue to work on weight loss, exercise, and decreasing simple carbohydrates to help decrease the risk of diabetes.   Advice given about COVID virus by phone. I encouraged Jenna Johnson to continue to work on weight loss to improve chronic health conditions. She and Jenna Johnson will also continue to practice social distancing and wear their masks when in public. I also encouraged her to take the COVID vaccine at her first opportunity. She and her husband both plan on taking the vaccine ASAP.  Obesity [name] is currently in the action stage of change. As such, her goal is to continue with weight loss efforts. She has agreed to Category 1 Plan or the Category 2 Plan on days with increased exercise.   Jenna Johnson will continue walking and biking for 40 minutes, 5 times per week.  We discussed the following behavioral modification strategies today: decreasing simple carbohydrates and planning for success.  She would like to fast for church for 21 days and we discussed how to do this correctly. She will start 12/11/19. She will still eat all of the Category 1 food but over a condensed period of time (Intermittent fasting).  JILLIEN Johnson has agreed to follow-up with our clinic in 3 weeks. She was informed of the importance of frequent  follow-up visits to maximize her success with intensive lifestyle modifications for her multiple health conditions.  Objective:   VITALS: Per patient if applicable, see vitals. GENERAL: Alert and in no acute distress. CARDIOPULMONARY: No increased WOB. Speaking in clear sentences.  PSYCH: Pleasant and cooperative. Speech normal rate and rhythm. Affect is appropriate. Insight and judgement are  appropriate. Attention is focused, linear, and appropriate.  NEURO: Oriented as arrived to appointment on time with no prompting.   Lab Results  Component Value Date   CREATININE 0.90 10/16/2019   BUN 21 10/16/2019   NA 140 10/16/2019   K 4.3 10/16/2019   CL 104 10/16/2019   CO2 26 10/16/2019   Lab Results  Component Value Date   ALT 19 10/16/2019   AST 18 10/16/2019   ALKPHOS 63 10/16/2019   BILITOT 0.4 10/16/2019   Lab Results  Component Value Date   HGBA1C 5.8 10/16/2019   HGBA1C 5.9 12/26/2018   HGBA1C 5.9 (H) 08/27/2018   Lab Results  Component Value Date   INSULIN 8.4 08/27/2018   Lab Results  Component Value Date   TSH 1.93 10/16/2019   Lab Results  Component Value Date   CHOL 146 08/27/2018   HDL 61 08/27/2018   LDLCALC 73 08/27/2018   TRIG 61 08/27/2018   CHOLHDL 3 01/23/2018   Lab Results  Component Value Date   WBC 7.8 10/16/2019   HGB 13.6 10/16/2019   HCT 41.4 10/16/2019   MCV 86.3 10/16/2019   PLT 153.0 10/16/2019   No results found for: IRON, TIBC, FERRITIN Attestation Statements:   Reviewed by clinician on day of visit: allergies, medications, problem list, medical history, surgical history, family history, social history and previous encounter notes.  Cristi Loron, am acting as Energy manager for [place], FNP-C.  I have reviewed the above documentation for accuracy and completeness, and I agree with the above. - Jesse Sans, FNP

## 2019-12-31 ENCOUNTER — Telehealth (INDEPENDENT_AMBULATORY_CARE_PROVIDER_SITE_OTHER): Payer: PRIVATE HEALTH INSURANCE | Admitting: Family Medicine

## 2019-12-31 ENCOUNTER — Encounter (INDEPENDENT_AMBULATORY_CARE_PROVIDER_SITE_OTHER): Payer: Self-pay | Admitting: Family Medicine

## 2019-12-31 ENCOUNTER — Other Ambulatory Visit: Payer: Self-pay

## 2019-12-31 DIAGNOSIS — Z6830 Body mass index (BMI) 30.0-30.9, adult: Secondary | ICD-10-CM

## 2019-12-31 DIAGNOSIS — E669 Obesity, unspecified: Secondary | ICD-10-CM

## 2019-12-31 DIAGNOSIS — R7303 Prediabetes: Secondary | ICD-10-CM | POA: Diagnosis not present

## 2020-01-01 NOTE — Progress Notes (Signed)
TeleHealth Visit:  Due to the COVID-19 pandemic, this visit was completed with telemedicine (audio/video) technology to reduce patient and provider exposure as well as to preserve personal protective equipment.   Jenna Johnson has verbally consented to this TeleHealth visit. The patient is located at home, the provider is located at the [facility] office. The participants in this visit include the listed provider and patient and any and all parties involved. The visit was conducted today via WebEx.  Chief Complaint: OBESITY Jenna Johnson is here to discuss her progress with her obesity treatment plan along with follow-up of her obesity related diagnoses. Jenna Johnson is on the Category 1 Plan and the Category 2 Plan and states she is following her eating plan approximately 95% of the time. Jenna Johnson states she is walking and bike riding 45 to 60 minutes 6 times per week.  Today's visit was #: 24 Starting weight: 171 lbs Starting date: 08/27/2018  Interim History: Jenna Johnson follows the Category 2 plan on days with more exercise and she follows the Category 1 plan other days. She has been doing some intermittent fasting for spiritual purposes for the last few weeks. Today is the last day of fasting. She is still getting her protein in. Jenna Johnson is anxious to get her COVID vaccine when it is available to her.  Subjective:   Prediabetes Jenna Johnson has a diagnosis of prediabetes based on her elevated Hgb A1c and was informed this puts her at greater risk of developing diabetes. She is on metformin. Her last A1c was 5.8 (10/16/19). Jenna Johnson has occasional sweets cravings but is overall eating less sweets than she was over Christmas. . She denies nausea or hypoglycemia.  Lab Results  Component Value Date   HGBA1C 5.8 10/16/2019   Lab Results  Component Value Date   INSULIN 8.4 08/27/2018    Assessment/Plan:   Prediabetes Jenna Johnson will continue with the meal plan  and she will continue to work on weight loss, exercise, and decreasing simple carbohydrates to help decrease the risk of diabetes.   Class 1 obesity with serious comorbidity and body mass index (BMI) of 30.0 to 30.9 in adult, unspecified obesity type Jenna Johnson is currently in the action stage of change. As such, her goal is to continue with weight loss efforts. She has agreed to the Category 1 Plan and the Category 2 Plan.   Exercise goals: Jenna Johnson will continue walking and bike riding for 45 to 60 minutes, 6 times per week.  Behavioral modification strategies: decreasing simple carbohydrates and planning for success.  Jenna Johnson has agreed to follow-up with our clinic in 3 weeks. She was informed of the importance of frequent follow-up visits to maximize her success with intensive lifestyle modifications for her multiple health conditions.  Objective:   VITALS: Per patient if applicable, see vitals. GENERAL: Alert and in no acute distress. CARDIOPULMONARY: No increased WOB. Speaking in clear sentences.  PSYCH: Pleasant and cooperative. Speech normal rate and rhythm. Affect is appropriate. Insight and judgement are appropriate. Attention is focused, linear, and appropriate.  NEURO: Oriented as arrived to appointment on time with no prompting.   Lab Results  Component Value Date   CREATININE 0.90 10/16/2019   BUN 21 10/16/2019   NA 140 10/16/2019   K 4.3 10/16/2019   CL 104 10/16/2019   CO2 26 10/16/2019   Lab Results  Component Value Date   ALT 19 10/16/2019   AST 18 10/16/2019   ALKPHOS 63 10/16/2019   BILITOT 0.4 10/16/2019  Lab Results  Component Value Date   HGBA1C 5.8 10/16/2019   HGBA1C 5.9 12/26/2018   HGBA1C 5.9 (H) 08/27/2018   Lab Results  Component Value Date   INSULIN 8.4 08/27/2018   Lab Results  Component Value Date   TSH 1.93 10/16/2019   Lab Results  Component Value Date   CHOL 146 08/27/2018   HDL 61 08/27/2018   LDLCALC 73 08/27/2018    TRIG 61 08/27/2018   CHOLHDL 3 01/23/2018   Lab Results  Component Value Date   WBC 7.8 10/16/2019   HGB 13.6 10/16/2019   HCT 41.4 10/16/2019   MCV 86.3 10/16/2019   PLT 153.0 10/16/2019   No results found for: IRON, TIBC, FERRITIN   Ref. Range 10/16/2019 08:38  VITD Latest Ref Range: 30.00 - 100.00 ng/mL 99.10   Attestation Statements:   Reviewed by clinician on day of visit: allergies, medications, problem list, medical history, surgical history, family history, social history, and previous encounter notes.  Jenna Johnson, am acting as Energy manager for [place], FNP-C.  I have reviewed the above documentation for accuracy and completeness, and I agree with the above. - Jenna Johnson [employer], FNP-C

## 2020-01-21 ENCOUNTER — Telehealth (INDEPENDENT_AMBULATORY_CARE_PROVIDER_SITE_OTHER): Payer: PRIVATE HEALTH INSURANCE | Admitting: Family Medicine

## 2020-01-21 ENCOUNTER — Other Ambulatory Visit: Payer: Self-pay

## 2020-01-21 ENCOUNTER — Encounter (INDEPENDENT_AMBULATORY_CARE_PROVIDER_SITE_OTHER): Payer: Self-pay | Admitting: Family Medicine

## 2020-01-21 DIAGNOSIS — E669 Obesity, unspecified: Secondary | ICD-10-CM

## 2020-01-21 DIAGNOSIS — E559 Vitamin D deficiency, unspecified: Secondary | ICD-10-CM

## 2020-01-21 DIAGNOSIS — Z6830 Body mass index (BMI) 30.0-30.9, adult: Secondary | ICD-10-CM | POA: Diagnosis not present

## 2020-01-21 NOTE — Progress Notes (Signed)
TeleHealth Visit:  Due to the COVID-19 pandemic, this visit was completed with telemedicine (audio/video) technology to reduce patient and provider exposure as well as to preserve personal protective equipment.   Jenna Johnson has verbally consented to this TeleHealth visit. The patient is located at home, the provider is located at the [facility] [facility] office. The participants in this visit include the listed provider, patient, husband [name]) and any and all parties involved. The visit was conducted today via WebEx.  Chief Complaint: OBESITY Jenna Johnson is here to discuss her progress with her obesity treatment plan along with follow-up of her obesity related diagnoses. Jenna Johnson is on the Category 1 Plan and the Category 2 Plan and states she is following her eating plan approximately 95% of the time. Jenna Johnson states she is walking and bike riding 30 to 45 minutes 5 to 6 times per week.  Today's visit was #: 25 Starting weight: 171 lbs Starting date: 08/27/2018  Interim History: Jenna Johnson reports she has gained one pound and her weight is 160 pounds today. She has been unable to exercise much over the past few days due to the weather. She is frustrated with the lack of weight loss.  Subjective:   Vitamin D deficiency Jenna Johnson's Vitamin D level was high (>100). She had been taking 5,000 IU OTC vit D daily by accident. She is now taking 5,000 IU OTC vit D 3 times per week.   Assessment/Plan:   Vitamin D deficiency  Jenna Johnson will continue OTC vitamin D 5,000 IU 3 times per week and she will follow-up for routine testing of Vitamin D, at least 2-3 times per year to avoid over-replacement.  Class 1 obesity with serious comorbidity and body mass index (BMI) of 30.0 to 30.9 in adult, unspecified obesity type Jenna Johnson is currently in the action stage of change. As such, her goal is to continue with weight loss efforts. She has agreed to the Category 1 Plan or  the Category 2 Plan.   Exercise goals: Jenna Johnson will continue her current exercise regimen.  Behavioral modification strategies: decreasing simple carbohydrates. We discussed the low carb plan and she will consider this. Handout for low carb was sent to patient via MyChart.   [name] has agreed to follow-up with our clinic in 3 weeks. She was informed of the importance of frequent follow-up visits to maximize her success with intensive lifestyle modifications for her multiple health conditions.  Objective:   VITALS: Per patient if applicable, see vitals. GENERAL: Alert and in no acute distress. CARDIOPULMONARY: No increased WOB. Speaking in clear sentences.  PSYCH: Pleasant and cooperative. Speech normal rate and rhythm. Affect is appropriate. Insight and judgement are appropriate. Attention is focused, linear, and appropriate.  NEURO: Oriented as arrived to appointment on time with no prompting.   Lab Results  Component Value Date   CREATININE 0.90 10/16/2019   BUN 21 10/16/2019   NA 140 10/16/2019   K 4.3 10/16/2019   CL 104 10/16/2019   CO2 26 10/16/2019   Lab Results  Component Value Date   ALT 19 10/16/2019   AST 18 10/16/2019   ALKPHOS 63 10/16/2019   BILITOT 0.4 10/16/2019   Lab Results  Component Value Date   HGBA1C 5.8 10/16/2019   HGBA1C 5.9 12/26/2018   HGBA1C 5.9 (H) 08/27/2018   Lab Results  Component Value Date   INSULIN 8.4 08/27/2018   Lab Results  Component Value Date   TSH 1.93 10/16/2019   Lab Results  Component Value Date   CHOL 146 08/27/2018   HDL 61 08/27/2018   LDLCALC 73 08/27/2018   TRIG 61 08/27/2018   CHOLHDL 3 01/23/2018   Lab Results  Component Value Date   WBC 7.8 10/16/2019   HGB 13.6 10/16/2019   HCT 41.4 10/16/2019   MCV 86.3 10/16/2019   PLT 153.0 10/16/2019   No results found for: IRON, TIBC, FERRITIN  Attestation Statements:   Reviewed by clinician on day of visit: allergies, medications, problem list,  medical history, surgical history, family history, social history, and previous encounter notes.  Corey Skains, am acting as Location manager for [employer], FNP-C.  I have reviewed the above documentation for accuracy and completeness, and I agree with the above. - Alexxus Sobh [employer], FNP-C

## 2020-01-23 ENCOUNTER — Encounter (INDEPENDENT_AMBULATORY_CARE_PROVIDER_SITE_OTHER): Payer: Self-pay | Admitting: Family Medicine

## 2020-01-27 ENCOUNTER — Encounter (INDEPENDENT_AMBULATORY_CARE_PROVIDER_SITE_OTHER): Payer: Self-pay | Admitting: Family Medicine

## 2020-01-27 ENCOUNTER — Ambulatory Visit: Payer: PRIVATE HEALTH INSURANCE | Attending: Family

## 2020-01-27 DIAGNOSIS — Z23 Encounter for immunization: Secondary | ICD-10-CM | POA: Insufficient documentation

## 2020-01-27 NOTE — Telephone Encounter (Signed)
Please advise 

## 2020-01-27 NOTE — Progress Notes (Signed)
   Covid-19 Vaccination Clinic  Name:  Jenna Johnson    MRN: [id number] DOB: 12-01-1956  01/27/2020  [name] was observed post Covid-19 immunization for 15 minutes without incidence. She was provided with Vaccine Information Sheet and instruction to access the V-Safe system.   [name] was instructed to call 911 with any severe reactions post vaccine: Marland Kitchen Difficulty breathing  . Swelling of your face and throat  . A fast heartbeat  . A bad rash all over your body  . Dizziness and weakness    Immunizations Administered    Name Date Dose VIS Date Route   Moderna COVID-19 Vaccine 01/27/2020  9:39 AM 0.5 mL 11/05/2019 Intramuscular   Manufacturer: Moderna   Lot: [id number]   NDC: 55208-022-33

## 2020-02-12 ENCOUNTER — Encounter (INDEPENDENT_AMBULATORY_CARE_PROVIDER_SITE_OTHER): Payer: Self-pay | Admitting: Family Medicine

## 2020-02-12 ENCOUNTER — Telehealth (INDEPENDENT_AMBULATORY_CARE_PROVIDER_SITE_OTHER): Payer: PRIVATE HEALTH INSURANCE | Admitting: Family Medicine

## 2020-02-12 ENCOUNTER — Other Ambulatory Visit: Payer: Self-pay

## 2020-02-12 DIAGNOSIS — R7303 Prediabetes: Secondary | ICD-10-CM

## 2020-02-12 DIAGNOSIS — Z6830 Body mass index (BMI) 30.0-30.9, adult: Secondary | ICD-10-CM

## 2020-02-12 DIAGNOSIS — I10 Essential (primary) hypertension: Secondary | ICD-10-CM | POA: Diagnosis not present

## 2020-02-12 DIAGNOSIS — E669 Obesity, unspecified: Secondary | ICD-10-CM | POA: Diagnosis not present

## 2020-02-12 DIAGNOSIS — E559 Vitamin D deficiency, unspecified: Secondary | ICD-10-CM | POA: Diagnosis not present

## 2020-02-12 NOTE — Progress Notes (Addendum)
TeleHealth Visit:  Due to the COVID-19 pandemic, this visit was completed with telemedicine (audio/video) technology to reduce patient and provider exposure as well as to preserve personal protective equipment.   Jenna Johnson has verbally consented to this TeleHealth visit. The patient is located at home, the provider is located at the [facility] office. The participants in this visit include the listed provider, patient, husband [name]) and any and all parties involved. The visit was conducted today via WebEx.  Chief Complaint: OBESITY Jenna Johnson is here to discuss her progress with her obesity treatment plan along with follow-up of her obesity related diagnoses. Artemisa is following a lower carbohydrate, vegetable and lean protein rich diet plan and states she is following her eating plan approximately 90 to 95% of the time. Chablis states she is biking and walking 40 to 60 minutes 3 times per week.  Today's visit was #: 26 Starting weight: 171 lbs Starting date: 08/27/2018  Interim History: Jenna Johnson reports she has lost weight (1 lb). Her weight is at 159 pounds (02/12/20). She was changed to the low carb plan at the last visit and she has been adhering well to it. She has also increased exercise.  Subjective:   Essential hypertension Jenna Johnson's blood pressure was elevated at home today (136/94). She is not currently on medication. She denies chest pain or shortness of breath.  BP Readings from Last 3 Encounters:  09/24/19 125/78  02/04/19 140/82  01/15/19 132/89   Lab Results  Component Value Date   CREATININE 0.90 10/16/2019   CREATININE 0.93 12/26/2018   CREATININE 0.82 08/27/2018   Prediabetes Jenna Johnson has a diagnosis of prediabetes and she is not on metformin. Her last A1c was 5.8. She continues to work on diet and exercise to decrease her risk of diabetes.   Lab Results  Component Value Date   HGBA1C 5.8 10/16/2019   Lab  Results  Component Value Date   INSULIN 8.4 08/27/2018   Assessment/Plan:   Essential hypertension [name] is working on healthy weight loss and exercise to improve blood pressure control. We will watch for signs of hypotension as she continues her lifestyle modifications. She will continue with the meal plan. She will check her blood pressure at home two times a week.  Prediabetes Jenna Johnson will continue to work on weight loss, exercise, and decreasing simple carbohydrates to help decrease the risk of diabetes. She will have her A1c checked at her annual complete physical exam, as well as her vitamin D.  Class 1 obesity with serious comorbidity and body mass index (BMI) of 30.0 to 30.9 in adult, unspecified obesity type - BMI greater than 30 at start of program  Jenna Johnson is currently in the action stage of change. As such, her goal is to continue with weight loss efforts. She has agreed to following the lower carbohydrate, vegetable and lean protein rich diet plan.   Exercise goals: Dariel will continue her current exercise regimen. She reports she will be increasing her activity.  Behavioral modification strategies: meal planning and cooking strategies. We discussed many aspects of the low carb plan.  1. She may substitute other nuts for almonds if they are similar in carb's.  2. She may have chick peas as part of her bean portion.  3. She may have tomatoes. 4. She may use cocoa powder sparingly in her coffee.  Jenna Johnson has agreed to follow-up with our clinic in 4 weeks. She was informed of the importance of frequent follow-up visits  to maximize her success with intensive lifestyle modifications for her multiple health conditions.  Objective:   VITALS: Per patient if applicable, see vitals. GENERAL: Alert and in no acute distress. CARDIOPULMONARY: No increased WOB. Speaking in clear sentences.  PSYCH: Pleasant and cooperative. Speech normal rate and rhythm. Affect is  appropriate. Insight and judgement are appropriate. Attention is focused, linear, and appropriate.  NEURO: Oriented as arrived to appointment on time with no prompting.   Lab Results  Component Value Date   CREATININE 0.90 10/16/2019   BUN 21 10/16/2019   NA 140 10/16/2019   K 4.3 10/16/2019   CL 104 10/16/2019   CO2 26 10/16/2019   Lab Results  Component Value Date   ALT 19 10/16/2019   AST 18 10/16/2019   ALKPHOS 63 10/16/2019   BILITOT 0.4 10/16/2019   Lab Results  Component Value Date   HGBA1C 5.8 10/16/2019   HGBA1C 5.9 12/26/2018   HGBA1C 5.9 (H) 08/27/2018   Lab Results  Component Value Date   INSULIN 8.4 08/27/2018   Lab Results  Component Value Date   TSH 1.93 10/16/2019   Lab Results  Component Value Date   CHOL 146 08/27/2018   HDL 61 08/27/2018   LDLCALC 73 08/27/2018   TRIG 61 08/27/2018   CHOLHDL 3 01/23/2018   Lab Results  Component Value Date   WBC 7.8 10/16/2019   HGB 13.6 10/16/2019   HCT 41.4 10/16/2019   MCV 86.3 10/16/2019   PLT 153.0 10/16/2019   No results found for: IRON, TIBC, FERRITIN   Ref. Range 10/16/2019 08:38  VITD Latest Ref Range: 30.00 - 100.00 ng/mL 99.10   Attestation Statements:   Reviewed by clinician on day of visit: allergies, medications, problem list, medical history, surgical history, family history, social history, and previous encounter notes.  Corey Skains, am acting as Location manager for [employer], FNP-C.  I have reviewed the above documentation for accuracy and completeness, and I agree with the above. - Amana Bouska [employer], FNP-C

## 2020-02-17 NOTE — Addendum Note (Signed)
Addended by: Jesse Sans on: 02/17/2020 06:45 AM   Modules accepted: Orders

## 2020-02-25 ENCOUNTER — Ambulatory Visit: Payer: 59 | Attending: Family

## 2020-02-25 DIAGNOSIS — Z23 Encounter for immunization: Secondary | ICD-10-CM

## 2020-02-25 NOTE — Progress Notes (Signed)
   Covid-19 Vaccination Clinic  Name:  Jenna Johnson    MRN: [id number] DOB: March 14, 1956  02/25/2020  [name] was observed post Covid-19 immunization for 15 minutes without incident. She was provided with Vaccine Information Sheet and instruction to access the V-Safe system.   [name] was instructed to call 911 with any severe reactions post vaccine: Marland Kitchen Difficulty breathing  . Swelling of face and throat  . A fast heartbeat  . A bad rash all over body  . Dizziness and weakness   Immunizations Administered    Name Date Dose VIS Date Route   Moderna COVID-19 Vaccine 02/25/2020  2:06 PM 0.5 mL 11/05/2019 Intramuscular   Manufacturer: Moderna   Lot: [id number]   NDC: 70263-785-88

## 2020-03-11 ENCOUNTER — Ambulatory Visit (INDEPENDENT_AMBULATORY_CARE_PROVIDER_SITE_OTHER): Payer: PRIVATE HEALTH INSURANCE | Admitting: Family Medicine

## 2020-03-11 ENCOUNTER — Encounter (INDEPENDENT_AMBULATORY_CARE_PROVIDER_SITE_OTHER): Payer: Self-pay | Admitting: Family Medicine

## 2020-03-11 ENCOUNTER — Other Ambulatory Visit: Payer: Self-pay

## 2020-03-11 VITALS — BP 147/83 | HR 75 | Temp 97.6°F | Ht 61.0 in | Wt 156.0 lb

## 2020-03-11 DIAGNOSIS — I10 Essential (primary) hypertension: Secondary | ICD-10-CM | POA: Diagnosis not present

## 2020-03-11 DIAGNOSIS — Z6830 Body mass index (BMI) 30.0-30.9, adult: Secondary | ICD-10-CM

## 2020-03-11 DIAGNOSIS — E669 Obesity, unspecified: Secondary | ICD-10-CM

## 2020-03-11 NOTE — Progress Notes (Signed)
Chief Complaint:   OBESITY Jenna Johnson is here to discuss her progress with her obesity treatment plan along with follow-up of her obesity related diagnoses. Jenna Johnson is on following a lower carbohydrate, vegetable and lean protein rich diet plan and states she is following her eating plan approximately 98% of the time. Jenna Johnson states she is walking, biking, and doing calisthenics for 45-60 minutes 5-6 times per week.  Today's visit was #: 27 Starting weight: 171 lbs Starting date: 08/27/2018 Today's weight: 156 lbs Today's date: 03/11/2020 Total lbs lost to date: 15 Total lbs lost since last in-office visit: 0  Interim History:  Her weight goal is 140-145 and weight is coming off very slowly now. Jenna Johnson notes she has been eating extra nuts and has had some "keto" sweets. She likes the Low carbohydrate plan better than Category 1 plan. She may start going back to the gym in a few weeks.  Subjective:   1. Essential hypertension Jenna Johnson's blood pressure is elevated today. She notes her home blood pressure were 129/69 and 127/73. She denies chest pain or shortness of breath. She is not on medications. She feels she gets amped up when coming into the office. She avoids salt.  Assessment/Plan:   1. Essential hypertension Jenna Johnson is working on healthy weight loss and exercise to improve blood pressure control. We will continue to monitor, and will watch for signs of hypotension as she continues her lifestyle modifications.  2. Class 1 obesity with serious comorbidity and body mass index (BMI) of 30.0 to 30.9 in adult, unspecified obesity type Jenna Johnson is currently in the action stage of change. As such, her goal is to continue with weight loss efforts. She has agreed to keeping a food journal and adhering to recommended goals of 1000-1200 calories and 75 grams of protein daily or following a lower carbohydrate, vegetable and lean protein rich diet plan.   Jenna Johnson  may have home made yogurt (1/2 cup) rather than beans some days.  Handout given today: Smart Fruit.  Exercise goals: As is.  Behavioral modification strategies: increasing lean protein intake, decreasing simple carbohydrates and keeping a strict food journal.  Jenna Johnson has agreed to follow-up with our clinic in 4 weeks. She was informed of the importance of frequent follow-up visits to maximize her success with intensive lifestyle modifications for her multiple health conditions.   Objective:   Blood pressure (!) 147/83, pulse 75, temperature 97.6 F (36.4 C), temperature source Oral, height 5\' 1"  (1.549 m), weight 156 lb (70.8 kg), SpO2 99 %. Body mass index is 29.48 kg/m.  General: Cooperative, alert, well developed, in no acute distress. HEENT: Conjunctivae and lids unremarkable. Cardiovascular: Regular rhythm.  Lungs: Normal work of breathing. Neurologic: No focal deficits.   Lab Results  Component Value Date   CREATININE 0.90 10/16/2019   BUN 21 10/16/2019   NA 140 10/16/2019   K 4.3 10/16/2019   CL 104 10/16/2019   CO2 26 10/16/2019   Lab Results  Component Value Date   ALT 19 10/16/2019   AST 18 10/16/2019   ALKPHOS 63 10/16/2019   BILITOT 0.4 10/16/2019   Lab Results  Component Value Date   HGBA1C 5.8 10/16/2019   HGBA1C 5.9 12/26/2018   HGBA1C 5.9 (H) 08/27/2018   Lab Results  Component Value Date   INSULIN 8.4 08/27/2018   Lab Results  Component Value Date   TSH 1.93 10/16/2019   Lab Results  Component Value Date   CHOL 146 08/27/2018  HDL 61 08/27/2018   LDLCALC 73 08/27/2018   TRIG 61 08/27/2018   CHOLHDL 3 01/23/2018   Lab Results  Component Value Date   WBC 7.8 10/16/2019   HGB 13.6 10/16/2019   HCT 41.4 10/16/2019   MCV 86.3 10/16/2019   PLT 153.0 10/16/2019   No results found for: IRON, TIBC, FERRITIN  Attestation Statements:   Reviewed by clinician on day of visit: allergies, medications, problem list, medical history,  surgical history, family history, social history, and previous encounter notes.   Wilhemena Durie, am acting as Location manager for [employer], FNP-C.  I have reviewed the above documentation for accuracy and completeness, and I agree with the above. -  Georgianne Fick, FNP

## 2020-03-12 ENCOUNTER — Ambulatory Visit (INDEPENDENT_AMBULATORY_CARE_PROVIDER_SITE_OTHER): Payer: PRIVATE HEALTH INSURANCE | Admitting: Family Medicine

## 2020-03-30 ENCOUNTER — Encounter: Payer: Self-pay | Admitting: Family Medicine

## 2020-03-30 DIAGNOSIS — Z1211 Encounter for screening for malignant neoplasm of colon: Secondary | ICD-10-CM

## 2020-04-06 ENCOUNTER — Encounter: Payer: Self-pay | Admitting: Gastroenterology

## 2020-04-07 ENCOUNTER — Encounter: Payer: Self-pay | Admitting: Family

## 2020-04-07 ENCOUNTER — Ambulatory Visit (HOSPITAL_BASED_OUTPATIENT_CLINIC_OR_DEPARTMENT_OTHER)
Admission: RE | Admit: 2020-04-07 | Discharge: 2020-04-07 | Disposition: A | Discharge status: Home / Self Care | Payer: 59 | Source: Ambulatory Visit | Attending: Family | Admitting: Family

## 2020-04-07 ENCOUNTER — Other Ambulatory Visit: Payer: Self-pay

## 2020-04-07 ENCOUNTER — Ambulatory Visit: Payer: 59 | Admitting: Family

## 2020-04-07 VITALS — BP 138/89 | HR 76 | Temp 97.8°F | Resp 16 | Ht 60.5 in | Wt 163.0 lb

## 2020-04-07 DIAGNOSIS — M545 Low back pain, unspecified: Secondary | ICD-10-CM

## 2020-04-07 DIAGNOSIS — R3129 Other microscopic hematuria: Secondary | ICD-10-CM | POA: Diagnosis not present

## 2020-04-07 DIAGNOSIS — N760 Acute vaginitis: Secondary | ICD-10-CM

## 2020-04-07 LAB — POC URINALSYSI DIPSTICK (AUTOMATED)
Bilirubin, UA: NEGATIVE
Glucose, UA: NEGATIVE
Ketones, UA: NEGATIVE
Leukocytes, UA: NEGATIVE
Nitrite, UA: NEGATIVE
Protein, UA: NEGATIVE
Spec Grav, UA: 1.015 (ref 1.010–1.025)
Urobilinogen, UA: NEGATIVE E.U./dL — AB
pH, UA: 6 (ref 5.0–8.0)

## 2020-04-07 MED ORDER — FLUCONAZOLE 150 MG PO TABS
ORAL_TABLET | ORAL | 0 refills | Status: DC
Start: 2020-04-07 — End: 2020-11-23

## 2020-04-07 MED ORDER — MELOXICAM 7.5 MG PO TABS
7.5000 mg | ORAL_TABLET | Freq: Every day | ORAL | 0 refills | Status: DC
Start: 2020-04-07 — End: 2020-05-25

## 2020-04-07 NOTE — Patient Instructions (Signed)
Please begin diflucan for your vaginal itching- let me know if your symptoms worsen or if not resolved in 1 week. Please complete abdominal x-ray on the first floor.  Begin meloxicam (anti-inflammatory) for your back pain.  Call if you develop worsening back pain or if not improved in 1 week.

## 2020-04-07 NOTE — Progress Notes (Signed)
Subjective:    Patient ID: Jenna Johnson, female    DOB: August 30, 1956, 64 y.o.   MRN: [id number]  HPI  Patient is a 64 yr old female who presents today with two concerns:  Back pain- did have some right sided low back pain.  Did    Vaginal Itching- denies discharge or odor.   Review of Systems See HPI  Past Medical History:  Diagnosis Date  . Allergic state   . Allergy   . Anemia   . Arthritis of knee, right   . Back pain   . Bursitis of both hips   . Chicken pox as a child  . Constipation   . Dermatitis 09/18/2013  . Endometriosis   . GERD (gastroesophageal reflux disease)   . H/O atrial septal defect   . Hip pain, bilateral 09/18/2013  . Hypertension   . IBS (irritable bowel syndrome)   . Joint pain   . Lactose intolerance   . Leg edema   . Measles as a child  . Multiple food allergies    Sugar  . Myalgia and myositis 09/18/2013  . Preventative health care 01/07/2014  . TIA (transient ischemic attack) 2004  . TIA (transient ischemic attack) 09/18/2013  . UTI (urinary tract infection)      Social History   Socioeconomic History  . Marital status: Married    Spouse name: Jenna Johnson  . Number of children: 0  . Years of education: Not on file  . Highest education level: Not on file  Occupational History  . Occupation: Retired  Tobacco Use  . Smoking status: Never Smoker  . Smokeless tobacco: Never Used  Substance and Sexual Activity  . Alcohol use: Yes    Comment: occasionally  . Drug use: No  . Sexual activity: Yes    Partners: Male  Other Topics Concern  . Not on file  Social History Narrative  . Not on file   Social Determinants of Health   Financial Resource Strain:   . Difficulty of Paying Living Expenses:   Food Insecurity:   . Worried About Charity fundraiser in the Last Year:   . Arboriculturist in the Last Year:   Transportation Needs:   . Film/video editor (Medical):   Marland Kitchen Lack of Transportation (Non-Medical):   Physical  Activity:   . Days of Exercise per Week:   . Minutes of Exercise per Session:   Stress:   . Feeling of Stress :   Social Connections:   . Frequency of Communication with Friends and Family:   . Frequency of Social Gatherings with Friends and Family:   . Attends Religious Services:   . Active Member of Clubs or Organizations:   . Attends Archivist Meetings:   Marland Kitchen Marital Status:   Intimate Partner Violence:   . Fear of Current or Ex-Partner:   . Emotionally Abused:   Marland Kitchen Physically Abused:   . Sexually Abused:     Past Surgical History:  Procedure Laterality Date  . ABDOMINAL HYSTERECTOMY  64 yrs old   still has one ovary  . APPENDECTOMY  64 yrs old  . ASD REPAIR  2006  . hole in heart    . RIGHT OOPHORECTOMY     ruptured ovarian cyst at age 40  . tia  surgery 2005    Family History  Problem Relation Age of Onset  . Multiple sclerosis Mother   . Other Mother  blue tumor, air embolism,  . Hypertension Mother   . Obesity Mother   . Diabetes Father 77       type 2  . Other Father        fatty liver, lactose intolerant  . Thyroid disease Father   . Cancer Paternal Grandmother 30       breast  . Diabetes Paternal Grandmother   . Other Sister        s/p hysterectomy  . Breast cancer Maternal Grandmother   . Other Sister        s/p hysterectomy    No Known Allergies  Current Outpatient Medications on File Prior to Visit  Medication Sig Dispense Refill  . acetaminophen (TYLENOL) 650 MG CR tablet Take 650 mg by mouth every 8 (eight) hours as needed for pain.    Marland Kitchen aspirin EC 81 MG tablet Take 1 tablet (81 mg total) by mouth daily.    . cholecalciferol (VITAMIN D3) 25 MCG (1000 UT) tablet Take 2,000 Units by mouth daily.    Marland Kitchen ibuprofen (ADVIL,MOTRIN) 200 MG tablet Take 200 mg by mouth every 6 (six) hours as needed.    . loratadine (CLARITIN) 10 MG tablet Take 10 mg by mouth daily as needed.      No current facility-administered medications on file prior  to visit.    BP 138/89 (BP Location: Left Arm, Patient Position: Sitting, Cuff Size: Large)   Pulse 76   Temp 97.8 F (36.6 C) (Temporal)   Resp 16   Ht 5' 0.5" (1.537 m)   Wt 163 lb (73.9 kg)   SpO2 100%   BMI 31.31 kg/m       Objective:   Physical Exam Constitutional:      Appearance: She is well-developed.  Neck:     Thyroid: No thyromegaly.  Cardiovascular:     Rate and Rhythm: Normal rate and regular rhythm.     Heart sounds: Normal heart sounds. No murmur.  Pulmonary:     Effort: Pulmonary effort is normal. No respiratory distress.     Breath sounds: Normal breath sounds. No wheezing.  Abdominal:     Comments: Mild right CVAT  Musculoskeletal:     Cervical back: Neck supple.     Comments: Some midline tenderness overlying the lumbar spine and in the right lower back  Skin:    General: Skin is warm and dry.  Neurological:     Mental Status: She is alert and oriented to person, place, and time.  Psychiatric:        Behavior: Behavior normal.        Thought Content: Thought content normal.        Judgment: Judgment normal.           Assessment & Plan:  Right sided low back pain-  Suspect musculoskeletal origin, however given trace blood on POC urine and mild CVAT will obtain a KUB to evaluate for kidney stone.  Microscopic hematuria- will send urine off for formal UA/micro to confirm.   Vaginitis- trial of diflucan. She is advised to call if symptoms worsen or if symptoms fail to improve.   This visit occurred during the SARS-CoV-2 public health emergency.  Safety protocols were in place, including screening questions prior to the visit, additional usage of staff PPE, and extensive cleaning of exam room while observing appropriate contact time as indicated for disinfecting solutions.

## 2020-04-08 LAB — URINALYSIS, ROUTINE W REFLEX MICROSCOPIC
Bilirubin Urine: NEGATIVE
Ketones, ur: NEGATIVE
Leukocytes,Ua: NEGATIVE
Nitrite: NEGATIVE
Specific Gravity, Urine: 1.015 (ref 1.000–1.030)
Total Protein, Urine: NEGATIVE
Urine Glucose: NEGATIVE
Urobilinogen, UA: 0.2 (ref 0.0–1.0)
pH: 7 (ref 5.0–8.0)

## 2020-04-08 LAB — URINE CULTURE
MICRO NUMBER:: 10437106
Result:: NO GROWTH
SPECIMEN QUALITY:: ADEQUATE

## 2020-04-09 ENCOUNTER — Ambulatory Visit (INDEPENDENT_AMBULATORY_CARE_PROVIDER_SITE_OTHER): Payer: PRIVATE HEALTH INSURANCE | Admitting: Family Medicine

## 2020-04-09 ENCOUNTER — Encounter (INDEPENDENT_AMBULATORY_CARE_PROVIDER_SITE_OTHER): Payer: Self-pay | Admitting: Family Medicine

## 2020-04-09 ENCOUNTER — Other Ambulatory Visit: Payer: Self-pay

## 2020-04-09 VITALS — BP 124/80 | HR 66 | Temp 97.9°F | Ht 61.0 in | Wt 156.0 lb

## 2020-04-09 DIAGNOSIS — E669 Obesity, unspecified: Secondary | ICD-10-CM | POA: Diagnosis not present

## 2020-04-09 DIAGNOSIS — I10 Essential (primary) hypertension: Secondary | ICD-10-CM

## 2020-04-09 DIAGNOSIS — Z6830 Body mass index (BMI) 30.0-30.9, adult: Secondary | ICD-10-CM | POA: Diagnosis not present

## 2020-04-09 DIAGNOSIS — R7303 Prediabetes: Secondary | ICD-10-CM

## 2020-04-09 NOTE — Progress Notes (Signed)
Chief Complaint:   OBESITY Jenna Johnson is here to discuss her progress with her obesity treatment plan along with follow-up of her obesity related diagnoses. Jenna Johnson is on keeping a food journal and adhering to recommended goals of 1000-1200 calories and 75 grams of protein daily or following a lower carbohydrate, vegetable and lean protein rich diet plan and states she is following her eating plan approximately 95% of the time. Jenna Johnson states she is walking and biking for 30-90 minutes 4 times per week.  Today's visit was #: 28 Starting weight: 171 lbs Starting date: 9/23/20219 Today's weight: 156 lbs Today's date: 04/09/2020 Total lbs lost to date: 15 Total lbs lost since last in-office visit: 0  Interim History:  Jenna Johnson husband was on the phone with [place] during the visit. Jenna Johnson did not track her calories because her husband did not measure the ingredients in the foods that he cooked. She has been following low carb and adhering well. We discussed weight goal today. Her weight has been essentially plateaued at around 156 lbs for about a year despite adequate exercise and good adherence to the meal plan. We have tried a combination of cat 1, cat 2, journaling, and low carb.  Her goal is to get out of pre-diabetic range and improve her hypertension without medications. She would like to lose down to 148 lbs. Her husband is wondering if there might be other plans out there such as Noom that may help her reach her goal.   Subjective:   1. Pre-diabetes Jenna Johnson's A1c is 5.8 without metformin. She does not want to take metformin at this point, but she would like to reduce her A1c to <5.7. She and her husband want to see if losing 10 more pounds will decrease her A1c to < 5.7.  2. Essential hypertension Jenna Johnson reports that her blood pressures have been borderline even at lower weights. She and her husband want to see if losing 10 more pounds will improve her BP. She  declines anti-hypertensive medications. BP Readings from Last 3 Encounters:  04/09/20 124/80  04/07/20 138/89  03/11/20 (!) 147/83     Assessment/Plan:   1. Pre-diabetes Jenna Johnson will continue her meal plan, and will continue to work on weight loss, exercise, and decreasing simple carbohydrates to help decrease the risk of diabetes.   2. Essential hypertension Jenna Johnson is working on healthy weight loss and exercise to improve blood pressure control. Jenna Johnson is to monitor her blood pressures at home.  3. Class 1 obesity with serious comorbidity and body mass index (BMI) of 30.0 to 30.9 in adult, unspecified obesity type Jenna Johnson is currently in the action stage of change. As such, her goal is to continue with weight loss efforts. She has agreed to the Category 1 Plan.   I advised them that I feel that her current weight (156 lbs, 29 BMI) is an acceptable weight for her. We discussed that losing down to 148 lbs may or may not improve her hypertension and pre-diabetes, and would require being much more strict with her meal plan and increased resistance training to improve metabolism. We also discussed that fact that 148 lbs may be a difficult weight for her to maintain.   Exercise goals:  Increased resistance training.  Behavioral modification strategies: planning for success.  Jenna Johnson has agreed to follow-up with our clinic in 6 weeks. She and Jenna Johnson would like to think about what to do going forward. I advised her that  she can take up to  a 6 month hiatus from our program and then return without going through the new patient process.   Objective:   Blood pressure 124/80, pulse 66, temperature 97.9 F (36.6 C), temperature source Oral, height 5\' 1"  (1.549 m), weight 156 lb (70.8 kg), SpO2 99 %. Body mass index is 29.48 kg/m.  General: Cooperative, alert, well developed, in no acute distress. HEENT: Conjunctivae and lids unremarkable. Cardiovascular: Regular rhythm.   Lungs: Normal work of breathing. Neurologic: No focal deficits.   Lab Results  Component Value Date   CREATININE 0.90 10/16/2019   BUN 21 10/16/2019   NA 140 10/16/2019   K 4.3 10/16/2019   CL 104 10/16/2019   CO2 26 10/16/2019   Lab Results  Component Value Date   ALT 19 10/16/2019   AST 18 10/16/2019   ALKPHOS 63 10/16/2019   BILITOT 0.4 10/16/2019   Lab Results  Component Value Date   HGBA1C 5.8 10/16/2019   HGBA1C 5.9 12/26/2018   HGBA1C 5.9 (H) 08/27/2018   Lab Results  Component Value Date   INSULIN 8.4 08/27/2018   Lab Results  Component Value Date   TSH 1.93 10/16/2019   Lab Results  Component Value Date   CHOL 146 08/27/2018   HDL 61 08/27/2018   LDLCALC 73 08/27/2018   TRIG 61 08/27/2018   CHOLHDL 3 01/23/2018   Lab Results  Component Value Date   WBC 7.8 10/16/2019   HGB 13.6 10/16/2019   HCT 41.4 10/16/2019   MCV 86.3 10/16/2019   PLT 153.0 10/16/2019   No results found for: IRON, TIBC, FERRITIN  Attestation Statements:   Reviewed by clinician on day of visit: allergies, medications, problem list, medical history, surgical history, family history, social history, and previous encounter notes.   Jenna Johnson, am acting as Location manager for [employer], FNP-C.  I have reviewed the above documentation for accuracy and completeness, and I agree with the above. -  Jenna Fick, FNP

## 2020-05-14 ENCOUNTER — Other Ambulatory Visit: Payer: Self-pay

## 2020-05-14 ENCOUNTER — Ambulatory Visit (AMBULATORY_SURGERY_CENTER): Payer: Self-pay | Admitting: *Deleted

## 2020-05-14 VITALS — Ht 61.0 in | Wt 161.0 lb

## 2020-05-14 DIAGNOSIS — Z1211 Encounter for screening for malignant neoplasm of colon: Secondary | ICD-10-CM

## 2020-05-14 MED ORDER — SUTAB 1479-225-188 MG PO TABS: 24.0000 | ORAL_TABLET | ORAL | 0 refills | Status: DC

## 2020-05-14 NOTE — Progress Notes (Signed)
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- she may need her husband at her bedside due to  her anxiety- I did explain we do not typically have CP in admitting- she may need a 24 g cath for her IV_ - encouraged drink as much as possible Thursday 6-24 am prior to her 530 am stop time and lots of fluids WED as well  No egg or soy allergy known to patient - lactose allergy  No issues with past sedation with any surgeries  or procedures, no intubation problems  No diet pills per patient No home 02 use per patient  No blood thinners per patient  Pt denies issues with constipation  No A fib or A flutter  EMMI video sent to pt's e mail   Due to the COVID-19 pandemic we are asking patients to follow these guidelines. Please only bring one care partner. Please be aware that your care partner may wait in the car in the parking lot or if they feel like they will be too hot to wait in the car, they may wait in the lobby on the 4th floor. All care partners are required to wear a mask the entire time (we do not have any that we can provide them), they need to practice social distancing, and we will do a Covid check for all patient's and care partners when you arrive. Also we will check their temperature and your temperature. If the care partner waits in their car they need to stay in the parking lot the entire time and we will call them on their cell phone when the patient is ready for discharge so they can bring the car to the front of the building. Also all patient's will need to wear a mask into building.

## 2020-05-21 ENCOUNTER — Encounter: Payer: Self-pay | Admitting: Gastroenterology

## 2020-05-25 ENCOUNTER — Other Ambulatory Visit: Payer: Self-pay

## 2020-05-25 ENCOUNTER — Ambulatory Visit (INDEPENDENT_AMBULATORY_CARE_PROVIDER_SITE_OTHER): Payer: PRIVATE HEALTH INSURANCE | Admitting: Family Medicine

## 2020-05-25 ENCOUNTER — Encounter (INDEPENDENT_AMBULATORY_CARE_PROVIDER_SITE_OTHER): Payer: Self-pay | Admitting: Family Medicine

## 2020-05-25 VITALS — BP 145/81 | HR 75 | Temp 97.8°F | Ht 61.0 in | Wt 158.0 lb

## 2020-05-25 DIAGNOSIS — E559 Vitamin D deficiency, unspecified: Secondary | ICD-10-CM | POA: Diagnosis not present

## 2020-05-25 DIAGNOSIS — E669 Obesity, unspecified: Secondary | ICD-10-CM

## 2020-05-25 DIAGNOSIS — Z6830 Body mass index (BMI) 30.0-30.9, adult: Secondary | ICD-10-CM

## 2020-05-25 DIAGNOSIS — I10 Essential (primary) hypertension: Secondary | ICD-10-CM

## 2020-05-25 MED ORDER — AMLODIPINE BESYLATE 2.5 MG PO TABS: 2.5000 mg | ORAL_TABLET | Freq: Every day | ORAL | 0 refills | Status: DC

## 2020-05-26 NOTE — Progress Notes (Signed)
Chief Complaint:   OBESITY Jenna Johnson is here to discuss her progress with her obesity treatment plan along with follow-up of her obesity related diagnoses. Jenna Johnson is on the Category 1 Plan and states she is following her eating plan approximately 95% of the time. Jenna Johnson states she is swimming, bike riding, and walking for 30-45 minutes 5 times per week.  Today's visit was #: 23 Starting weight: 171 lbs Starting date: 08/27/2018 Today's weight: 158 lbs Today's date: 05/25/2020 Total lbs lost to date: 13 Total lbs lost since last in-office visit: 0  Interim History: Jenna Johnson has been following the Low carbohydrate plan with some added fruit. She is doing quite a bit of exercise training for and 80 mile bike track. She requests a 12 week follow up.  Subjective:   1. Essential hypertension [name]'s blood pressure has been elevated several times >140/90. Cardiovascular ROS: no chest pain or dyspnea on exertion.  BP Readings from Last 3 Encounters:  05/25/20 (!) 145/81  04/09/20 124/80  04/07/20 138/89   Lab Results  Component Value Date   CREATININE 0.90 10/16/2019   CREATININE 0.93 12/26/2018   CREATININE 0.82 08/27/2018   2. Vitamin D deficiency Jenna Johnson is on OTC Vit D 5,000 IU 3 times per week. Her Vit D level was nearly over-replaced at 99 on 10,000 IU daily.  Assessment/Plan:   1. Essential hypertension Jenna Johnson is working on healthy weight loss and exercise to improve blood pressure control. We will watch for signs of hypotension as she continues her lifestyle modifications. [name] agreed to start amlodipine 2.5 mg q daily with no refills.  - amLODipine (NORVASC) 2.5 MG tablet; Take 1 tablet (2.5 mg total) by mouth daily.  Dispense: 30 tablet; Refill: 0  2. Vitamin D deficiency Low Vitamin D level contributes to fatigue and are associated with obesity, breast, and colon cancer. We will check labs today, and Jenna Johnson will follow-up  for routine testing of Vitamin D, at least 2-3 times per year to avoid over-replacement.  - VITAMIN D 25 Hydroxy (Vit-D Deficiency, Fractures)  3. Class 1 obesity with serious comorbidity and body mass index (BMI) of 30.0 to 30.9 in adult, unspecified obesity type [place] is currently in the action stage of change. As such, her goal is to continue with weight loss efforts. She has agreed to following a lower carbohydrate, vegetable and lean protein rich diet plan or [place].   Exercise goals: As is.  Behavioral modification strategies: decreasing simple carbohydrates.  Jenna Johnson has requests follow-up with our clinic in 12 weeks. Jenna Johnson was informed we would discuss her lab results at her next visit unless there is a critical issue that needs to be addressed sooner. [name] agreed to keep her next visit at the agreed upon time to discuss these results.  Objective:   Blood pressure (!) 145/81, pulse 75, temperature 97.8 F (36.6 C), temperature source Oral, height 5\' 1"  (1.549 m), weight 158 lb (71.7 kg), SpO2 100 %. Body mass index is 29.85 kg/m.  General: Cooperative, alert, well developed, in no acute distress. HEENT: Conjunctivae and lids unremarkable. Cardiovascular: Regular rhythm.  Lungs: Normal work of breathing. Neurologic: No focal deficits.   Lab Results  Component Value Date   CREATININE 0.90 10/16/2019   BUN 21 10/16/2019   NA 140 10/16/2019   K 4.3 10/16/2019   CL 104 10/16/2019   CO2 26 10/16/2019   Lab Results  Component Value Date   ALT 19 10/16/2019   AST 18 10/16/2019  ALKPHOS 63 10/16/2019   BILITOT 0.4 10/16/2019   Lab Results  Component Value Date   HGBA1C 5.8 10/16/2019   HGBA1C 5.9 12/26/2018   HGBA1C 5.9 (H) 08/27/2018   Lab Results  Component Value Date   INSULIN 8.4 08/27/2018   Lab Results  Component Value Date   TSH 1.93 10/16/2019   Lab Results  Component Value Date   CHOL 146 08/27/2018   HDL 61 08/27/2018    LDLCALC 73 08/27/2018   TRIG 61 08/27/2018   CHOLHDL 3 01/23/2018   Lab Results  Component Value Date   WBC 7.8 10/16/2019   HGB 13.6 10/16/2019   HCT 41.4 10/16/2019   MCV 86.3 10/16/2019   PLT 153.0 10/16/2019   No results found for: IRON, TIBC, FERRITIN  Attestation Statements:   Reviewed by clinician on day of visit: allergies, medications, problem list, medical history, surgical history, family history, social history, and previous encounter notes.   Trude Mcburney, am acting as Energy manager for [place], FNP-C.  I have reviewed the above documentation for accuracy and completeness, and I agree with the above. -  Jesse Sans, FNP

## 2020-05-28 ENCOUNTER — Ambulatory Visit (AMBULATORY_SURGERY_CENTER): Payer: 59 | Admitting: Gastroenterology

## 2020-05-28 ENCOUNTER — Encounter: Payer: Self-pay | Admitting: Gastroenterology

## 2020-05-28 ENCOUNTER — Other Ambulatory Visit: Payer: Self-pay

## 2020-05-28 VITALS — BP 150/87 | HR 75 | Temp 96.8°F | Resp 15 | Ht 61.0 in | Wt 161.0 lb

## 2020-05-28 DIAGNOSIS — Z1211 Encounter for screening for malignant neoplasm of colon: Secondary | ICD-10-CM | POA: Diagnosis not present

## 2020-05-28 MED ORDER — SODIUM CHLORIDE 0.9 % IV SOLN: 500.0000 mL | Freq: Once | INTRAVENOUS | Status: DC

## 2020-05-28 NOTE — Patient Instructions (Signed)
Please read all of the handouts given to you by your recovery room nurse.  I did include a hemorrhoidal banding brochure.Thank-you for choosing [place] for your healthcare needs today.  YOU HAD AN ENDOSCOPIC PROCEDURE TODAY AT THE [facility]:   Refer to the procedure report that was given to you for any specific questions about what was found during the examination.  If the procedure report does not answer your questions, please call your gastroenterologist to clarify.  If you requested that your care partner not be given the details of your procedure findings, then the procedure report has been included in a sealed envelope for you to review at your convenience later.  YOU SHOULD EXPECT: Some feelings of bloating in the abdomen. Passage of more gas than usual.  Walking can help get rid of the air that was put into your GI tract during the procedure and reduce the bloating. If you had a lower endoscopy (such as a colonoscopy or flexible sigmoidoscopy) you may notice spotting of blood in your stool or on the toilet paper. If you underwent a bowel prep for your procedure, you may not have a normal bowel movement for a few days.  Please Note:  You might notice some irritation and congestion in your nose or some drainage.  This is from the oxygen used during your procedure.  There is no need for concern and it should clear up in a day or so.  SYMPTOMS TO REPORT IMMEDIATELY:   Following lower endoscopy (colonoscopy or flexible sigmoidoscopy):  Excessive amounts of blood in the stool  Significant tenderness or worsening of abdominal pains  Swelling of the abdomen that is new, acute  Fever of 100F or higher   For urgent or emergent issues, a gastroenterologist can be reached at any hour by calling [phone]. Do not use MyChart messaging for urgent concerns.    DIET:  We do recommend a small meal at first, but then you may proceed to your regular diet.  Drink plenty of fluids but you  should avoid alcoholic beverages for 24 hours. Try to increase the fiber in your diet, and drink plenty of water.  ACTIVITY:  You should plan to take it easy for the rest of today and you should NOT DRIVE or use heavy machinery until tomorrow (because of the sedation medicines used during the test).    FOLLOW UP: Our staff will call the number listed on your records 48-72 hours following your procedure to check on you and address any questions or concerns that you may have regarding the information given to you following your procedure. If we do not reach you, we will leave a message.  We will attempt to reach you two times.  During this call, we will ask if you have developed any symptoms of COVID 19. If you develop any symptoms (ie: fever, flu-like symptoms, shortness of breath, cough etc.) before then, please call [phone].  If you test positive for Covid 19 in the 2 weeks post procedure, please call and report this information to [place].     SIGNATURES/CONFIDENTIALITY: You and/or your care partner have signed paperwork which will be entered into your electronic medical record.  These signatures attest to the fact that that the information above on your After Visit Summary has been reviewed and is understood.  Full responsibility of the confidentiality of this discharge information lies with you and/or your care-partner.

## 2020-05-28 NOTE — Progress Notes (Signed)
A/ox3, pleased with MAC, report to RN 

## 2020-05-28 NOTE — Progress Notes (Signed)
VS by Janyce Llanos to room to assist during endoscopic procedure.  Patient ID and intended procedure confirmed with present staff. Received instructions for my participation in the procedure from the performing physician.

## 2020-05-28 NOTE — Op Note (Signed)
[facility] Patient Name: Jenna Johnson Procedure Date: 05/28/2020 8:40 AM MRN: [id number] Endoscopist: Mauri Pole , MD Age: 64 Referring MD:  Date of Birth: Oct 31, 1956 Gender: Female Account #: [id number] Procedure:                Colonoscopy Indications:              Screening for colorectal malignant neoplasm Medicines:                Monitored Anesthesia Care Procedure:                Pre-Anesthesia Assessment:                           - Prior to the procedure, a History and Physical                            was performed, and patient medications and                            allergies were reviewed. The patient's tolerance of                            previous anesthesia was also reviewed. The risks                            and benefits of the procedure and the sedation                            options and risks were discussed with the patient.                            All questions were answered, and informed consent                            was obtained. Prior Anticoagulants: The patient has                            taken no previous anticoagulant or antiplatelet                            agents. ASA Grade Assessment: II - A patient with                            mild systemic disease. After reviewing the risks                            and benefits, the patient was deemed in                            satisfactory condition to undergo the procedure.                           After obtaining informed consent, the colonoscope  was passed under direct vision. Throughout the                            procedure, the patient's blood pressure, pulse, and                            oxygen saturations were monitored continuously. The                            Colonoscope was introduced through the anus and                            advanced to the the cecum, identified by                            appendiceal  orifice and ileocecal valve. The                            colonoscopy was performed without difficulty. The                            patient tolerated the procedure well. The quality                            of the bowel preparation was adequate. The                            ileocecal valve, appendiceal orifice, and rectum                            were photographed. Scope In: 8:51:16 AM Scope Out: 9:09:31 AM Scope Withdrawal Time: 0 hours 14 minutes 2 seconds  Total Procedure Duration: 0 hours 18 minutes 15 seconds  Findings:                 The perianal and digital rectal examinations were                            normal.                           Scattered small-mouthed diverticula were found in                            the sigmoid colon and descending colon.                           Non-bleeding internal hemorrhoids were found during                            retroflexion. The hemorrhoids were small.                           The exam was otherwise without abnormality. Complications:            No immediate complications. Estimated Blood Loss:  Estimated blood loss: none. Impression:               - Diverticulosis in the sigmoid colon and in the                            descending colon.                           - Non-bleeding internal hemorrhoids.                           - The examination was otherwise normal.                           - No specimens collected. Recommendation:           - Patient has a contact number available for                            emergencies. The signs and symptoms of potential                            delayed complications were discussed with the                            patient. Return to normal activities tomorrow.                            Written discharge instructions were provided to the                            patient.                           - Resume previous diet.                           - Continue present  medications.                           - Await pathology results.                           - Repeat colonoscopy in 10 years for surveillance                            based on pathology results. Napoleon Form, MD 05/28/2020 9:13:58 AM This report has been signed electronically.

## 2020-06-01 ENCOUNTER — Ambulatory Visit (INDEPENDENT_AMBULATORY_CARE_PROVIDER_SITE_OTHER): Payer: 59 | Admitting: Family Medicine

## 2020-06-01 ENCOUNTER — Encounter (INDEPENDENT_AMBULATORY_CARE_PROVIDER_SITE_OTHER): Payer: Self-pay | Admitting: Family Medicine

## 2020-06-01 ENCOUNTER — Other Ambulatory Visit: Payer: Self-pay

## 2020-06-01 ENCOUNTER — Encounter: Payer: Self-pay | Admitting: Family Medicine

## 2020-06-01 ENCOUNTER — Other Ambulatory Visit (HOSPITAL_COMMUNITY)
Admission: RE | Admit: 2020-06-01 | Discharge: 2020-06-01 | Disposition: A | Discharge status: Home / Self Care | Payer: 59 | Source: Ambulatory Visit | Attending: Family Medicine | Admitting: Family Medicine

## 2020-06-01 ENCOUNTER — Telehealth: Payer: Self-pay

## 2020-06-01 VITALS — BP 129/54 | HR 63 | Temp 97.8°F | Resp 12 | Ht 61.0 in | Wt 159.0 lb

## 2020-06-01 DIAGNOSIS — E559 Vitamin D deficiency, unspecified: Secondary | ICD-10-CM

## 2020-06-01 DIAGNOSIS — E669 Obesity, unspecified: Secondary | ICD-10-CM | POA: Diagnosis not present

## 2020-06-01 DIAGNOSIS — Z0000 Encounter for general adult medical examination without abnormal findings: Secondary | ICD-10-CM

## 2020-06-01 DIAGNOSIS — L918 Other hypertrophic disorders of the skin: Secondary | ICD-10-CM

## 2020-06-01 DIAGNOSIS — Z124 Encounter for screening for malignant neoplasm of cervix: Secondary | ICD-10-CM

## 2020-06-01 DIAGNOSIS — I10 Essential (primary) hypertension: Secondary | ICD-10-CM

## 2020-06-01 DIAGNOSIS — E66811 Obesity, class 1: Secondary | ICD-10-CM

## 2020-06-01 DIAGNOSIS — N761 Subacute and chronic vaginitis: Secondary | ICD-10-CM | POA: Insufficient documentation

## 2020-06-01 DIAGNOSIS — R7303 Prediabetes: Secondary | ICD-10-CM

## 2020-06-01 DIAGNOSIS — Z6830 Body mass index (BMI) 30.0-30.9, adult: Secondary | ICD-10-CM

## 2020-06-01 DIAGNOSIS — M2550 Pain in unspecified joint: Secondary | ICD-10-CM

## 2020-06-01 DIAGNOSIS — D508 Other iron deficiency anemias: Secondary | ICD-10-CM

## 2020-06-01 NOTE — Assessment & Plan Note (Signed)
Pap taken today. No acute concerns.

## 2020-06-01 NOTE — Assessment & Plan Note (Signed)
Treated with Diflucan recently with good results will check cultures today

## 2020-06-01 NOTE — Assessment & Plan Note (Signed)
Better with last check. Increase leafy greens, consider increased lean red meat and using cast iron cookware. Continue to monitor, report any concerns

## 2020-06-01 NOTE — Assessment & Plan Note (Signed)
Well controlled, no changes to meds. Encouraged heart healthy diet such as the DASH diet and exercise as tolerated. Tolerating Amlodipine. They will monitor and report any concerns.

## 2020-06-01 NOTE — Assessment & Plan Note (Signed)
Hips, knees, elbows, no redness, swelling or warmth. No injury or trauma. Likely osteoarthritis. Continue to stay as active as tolerated. Topical creams can provide some relief and report if worsens.

## 2020-06-01 NOTE — Telephone Encounter (Signed)
Covid-19 screening questions   Do you now or have you had a fever in the last 14 days? No.  Do you have any respiratory symptoms of shortness of breath or cough now or in the last 14 days? No.  Do you have any family members or close contacts with diagnosed or suspected Covid-19 in the past 14 days? No.  Have you been tested for Covid-19 and found to be positive? No.       Follow up Call-  Call back number 05/28/2020  Post procedure Call Back phone  # [phone]  Permission to leave phone message Yes  Some recent data might be hidden     Patient questions:  Do you have a fever, pain , or abdominal swelling? No. Pain Score  0 *  Have you tolerated food without any problems? Yes.    Have you been able to return to your normal activities? Yes.    Do you have any questions about your discharge instructions: Diet   No. Medications  No. Follow up visit  No.  Do you have questions or concerns about your Care? Yes.   Pt. Notes she has not had a bowel movement since her procedure.  Told pt. It was expected no to have one for at least a few days following a colonoscopy, as her system was all cleaned out. Actions: * If pain score is 4 or above: No action needed, pain <4.

## 2020-06-01 NOTE — Assessment & Plan Note (Signed)
Supplement and monitor. Taking 5000 IU 3 x a week encouraged to switch to 12-1998 IU daily

## 2020-06-01 NOTE — Assessment & Plan Note (Signed)
Small but numerous on face, referred to dermatology for further consideraiton

## 2020-06-01 NOTE — Progress Notes (Signed)
Subjective:    Patient ID: Jenna Johnson, female    DOB: 28-Jan-1956, 64 y.o.   MRN: [id number]  Chief Complaint  Patient presents with  . Annual Exam    HPI Patient is in today for annual preventative exam and follow up on chronic medical concerns. No recent febrile illness or hospitalizations. She did have a bout of vaginitis that was well treated with diflucan and has not returned. She is staying very active and exercising regularly, eats well with heart healthy diet. Is endorsing increased joint pain in hips, knees and elbows. No redness, warmth or trauma. Has seen sports medicine and gotten some benefit but had some side effects with injection in knee(numbness down leg) and and has not returned. Denies CP/palp/SOB/HA/congestion/fevers/GI or GU c/o. Taking meds as prescribed  Past Medical History:  Diagnosis Date  . Allergic state   . Allergy   . Anemia    long time ago per pt   . Arthritis of knee, right    and left knee   . Back pain   . Bursitis of both hips   . Cataract    forming  right eye she thinks   . Chicken pox as a child  . Constipation   . Dermatitis 09/18/2013  . Endometriosis   . GERD (gastroesophageal reflux disease)    changed diet and better   . H/O atrial septal defect   . Hip pain, bilateral 09/18/2013  . Hypertension   . IBS (irritable bowel syndrome)   . Joint pain   . Lactose intolerance   . Leg edema   . Measles as a child  . Myalgia and myositis 09/18/2013  . Osteopenia    mild   . Preventative health care 01/07/2014  . TIA (transient ischemic attack) 2005  . UTI (urinary tract infection)     Past Surgical History:  Procedure Laterality Date  . ABDOMINAL HYSTERECTOMY  64 yrs old   still has one ovary  . APPENDECTOMY  64 yrs old  . ASD REPAIR  2006  . COLONOSCOPY     12 yr in [place]   . hole in heart    . RIGHT OOPHORECTOMY     ruptured ovarian cyst at age 25  . tia  surgery 2005    Family History  Problem Relation Age of  Onset  . Multiple sclerosis Mother   . Other Mother        blue tumor, air embolism,  . Hypertension Mother   . Obesity Mother   . Diabetes Father 37       type 2  . Other Father        fatty liver, lactose intolerant  . Thyroid disease Father   . Colon polyps Father   . Cancer Paternal Grandmother 67       breast  . Diabetes Paternal Grandmother   . Breast cancer Paternal Grandmother   . Other Sister        s/p hysterectomy  . Other Sister        s/p hysterectomy  . Colon cancer Neg Hx   . Esophageal cancer Neg Hx   . Stomach cancer Neg Hx   . Rectal cancer Neg Hx     Social History   Socioeconomic History  . Marital status: Married    Spouse name: Suzann Lazaro  . Number of children: 0  . Years of education: Not on file  . Highest education level: Not on file  Occupational History  .  Occupation: Retired  Tobacco Use  . Smoking status: Never Smoker  . Smokeless tobacco: Never Used  Substance and Sexual Activity  . Alcohol use: Yes    Comment: occasionally  . Drug use: No  . Sexual activity: Yes    Partners: Male  Other Topics Concern  . Not on file  Social History Narrative  . Not on file   Social Determinants of Health   Financial Resource Strain:   . Difficulty of Paying Living Expenses:   Food Insecurity:   . Worried About Programme researcher, broadcasting/film/video in the Last Year:   . Barista in the Last Year:   Transportation Needs:   . Freight forwarder (Medical):   Marland Kitchen Lack of Transportation (Non-Medical):   Physical Activity:   . Days of Exercise per Week:   . Minutes of Exercise per Session:   Stress:   . Feeling of Stress :   Social Connections:   . Frequency of Communication with Friends and Family:   . Frequency of Social Gatherings with Friends and Family:   . Attends Religious Services:   . Active Member of Clubs or Organizations:   . Attends Banker Meetings:   Marland Kitchen Marital Status:   Intimate Partner Violence:   . Fear of Current  or Ex-Partner:   . Emotionally Abused:   Marland Kitchen Physically Abused:   . Sexually Abused:     Outpatient Medications Prior to Visit  Medication Sig Dispense Refill  . acetaminophen (TYLENOL) 650 MG CR tablet Take 650 mg by mouth every 8 (eight) hours as needed for pain.    Marland Kitchen amLODipine (NORVASC) 2.5 MG tablet Take 1 tablet (2.5 mg total) by mouth daily. 30 tablet 0  . aspirin EC 81 MG tablet Take 1 tablet (81 mg total) by mouth daily.    . cholecalciferol (VITAMIN D3) 25 MCG (1000 UT) tablet Take 2,000 Units by mouth daily.    . fluconazole (DIFLUCAN) 150 MG tablet Take 1 tablet by mouth today. May repeat in 3 days if symptoms are not improved. 2 tablet 0  . ibuprofen (ADVIL,MOTRIN) 200 MG tablet Take 200 mg by mouth every 6 (six) hours as needed.    . loratadine (CLARITIN) 10 MG tablet Take 10 mg by mouth daily as needed.     . Sodium Sulfate-Mag Sulfate-KCl (SUTAB) [id number] MG TABS Take 24 tablets by mouth as directed. 24 tablet 0   No facility-administered medications prior to visit.    No Known Allergies  Review of Systems  Constitutional: Negative for fever and malaise/fatigue.  HENT: Negative for congestion.   Eyes: Negative for blurred vision.  Respiratory: Negative for shortness of breath.   Cardiovascular: Negative for chest pain, palpitations and leg swelling.  Gastrointestinal: Negative for abdominal pain, blood in stool and nausea.  Genitourinary: Negative for dysuria and frequency.  Musculoskeletal: Positive for joint pain. Negative for falls.  Skin: Negative for rash.       Numerous dark skin tags on face.  Neurological: Negative for dizziness, loss of consciousness and headaches.  Endo/Heme/Allergies: Negative for environmental allergies.  Psychiatric/Behavioral: Negative for depression. The patient is not nervous/anxious.        Objective:    Physical Exam Constitutional:      General: She is not in acute distress.    Appearance: She is well-developed.  HENT:      Head: Normocephalic and atraumatic.  Eyes:     Conjunctiva/sclera: Conjunctivae normal.  Neck:  Thyroid: No thyromegaly.  Cardiovascular:     Rate and Rhythm: Normal rate and regular rhythm.     Heart sounds: Normal heart sounds. No murmur heard.   Pulmonary:     Effort: Pulmonary effort is normal. No respiratory distress.     Breath sounds: Normal breath sounds.  Abdominal:     General: Bowel sounds are normal. There is no distension.     Palpations: Abdomen is soft. There is no mass.     Tenderness: There is no abdominal tenderness.  Genitourinary:    General: Normal vulva.     Rectum: Normal.     Comments: Scant thin white vaginal discharge. Breast exam unremarkable for any lesion, discharge or skin changes bilaterally Musculoskeletal:     Cervical back: Neck supple.  Lymphadenopathy:     Cervical: No cervical adenopathy.  Skin:    General: Skin is warm and dry.  Neurological:     Mental Status: She is alert and oriented to person, place, and time.  Psychiatric:        Behavior: Behavior normal.     BP (!) 129/54 (BP Location: Left Arm, Cuff Size: Large)   Pulse 63   Temp 97.8 F (36.6 C) (Temporal)   Resp 12   Ht 5\' 1"  (1.549 m)   Wt 159 lb (72.1 kg)   SpO2 100%   BMI 30.04 kg/m  Wt Readings from Last 3 Encounters:  06/01/20 159 lb (72.1 kg)  05/28/20 161 lb (73 kg)  05/25/20 158 lb (71.7 kg)    Diabetic Foot Exam - Simple   No data filed     Lab Results  Component Value Date   WBC 7.8 10/16/2019   HGB 13.6 10/16/2019   HCT 41.4 10/16/2019   PLT 153.0 10/16/2019   GLUCOSE 86 10/16/2019   CHOL 146 08/27/2018   TRIG 61 08/27/2018   HDL 61 08/27/2018   LDLCALC 73 08/27/2018   ALT 19 10/16/2019   AST 18 10/16/2019   NA 140 10/16/2019   K 4.3 10/16/2019   CL 104 10/16/2019   CREATININE 0.90 10/16/2019   BUN 21 10/16/2019   CO2 26 10/16/2019   TSH 1.93 10/16/2019   HGBA1C 5.8 10/16/2019    Lab Results  Component Value Date   TSH  1.93 10/16/2019   Lab Results  Component Value Date   WBC 7.8 10/16/2019   HGB 13.6 10/16/2019   HCT 41.4 10/16/2019   MCV 86.3 10/16/2019   PLT 153.0 10/16/2019   Lab Results  Component Value Date   NA 140 10/16/2019   K 4.3 10/16/2019   CO2 26 10/16/2019   GLUCOSE 86 10/16/2019   BUN 21 10/16/2019   CREATININE 0.90 10/16/2019   BILITOT 0.4 10/16/2019   ALKPHOS 63 10/16/2019   AST 18 10/16/2019   ALT 19 10/16/2019   PROT 7.2 10/16/2019   ALBUMIN 4.2 10/16/2019   CALCIUM 9.3 10/16/2019   GFR 76.90 10/16/2019   Lab Results  Component Value Date   CHOL 146 08/27/2018   Lab Results  Component Value Date   HDL 61 08/27/2018   Lab Results  Component Value Date   LDLCALC 73 08/27/2018   Lab Results  Component Value Date   TRIG 61 08/27/2018   Lab Results  Component Value Date   CHOLHDL 3 01/23/2018   Lab Results  Component Value Date   HGBA1C 5.8 10/16/2019       Assessment & Plan:   Problem List Items Addressed  This Visit    Anemia    Better with last check. Increase leafy greens, consider increased lean red meat and using cast iron cookware. Continue to monitor, report any concerns      Relevant Orders   Ambulatory referral to Dermatology   CBC   Hypertension    Well controlled, no changes to meds. Encouraged heart healthy diet such as the DASH diet and exercise as tolerated. Tolerating Amlodipine. They will monitor and report any concerns.       Relevant Orders   CBC   Lipid panel   Preventative health care    Patient encouraged to maintain heart healthy diet, regular exercise, adequate sleep. Consider daily probiotics. Take medications as prescribed. Reviewed colonoscopy and MGM all up to date. Pap and labwork done today      Class 1 obesity with serious comorbidity and body mass index (BMI) of 30.0 to 30.9 in adult   Vitamin D deficiency    Supplement and monitor. Taking 5000 IU 3 x a week encouraged to switch to 12-1998 IU daily        Relevant Orders   Comprehensive metabolic panel   TSH   VITAMIN D 25 Hydroxy (Vit-D Deficiency, Fractures)   Prediabetes    hgba1c acceptable, minimize simple carbs, increase exercise as tolerated.      Relevant Orders   Hemoglobin A1c   Lipid panel   Skin tags, multiple acquired - Primary    Small but numerous on face, referred to dermatology for further consideraiton      Relevant Orders   Ambulatory referral to Dermatology   Cervical cancer screening    Pap taken today. No acute concerns.      Relevant Orders   Cytology - PAP( [place])   Chronic vaginitis    Treated with Diflucan recently with good results will check cultures today      Relevant Orders   Cervicovaginal ancillary only( [place])   Arthralgia    Hips, knees, elbows, no redness, swelling or warmth. No injury or trauma. Likely osteoarthritis. Continue to stay as active as tolerated. Topical creams can provide some relief and report if worsens.          I am having [name]" maintain her aspirin EC, loratadine, acetaminophen, ibuprofen, cholecalciferol, fluconazole, Sutab, and amLODipine.  No orders of the defined types were placed in this encounter.    Danise Edge, MD

## 2020-06-01 NOTE — Assessment & Plan Note (Signed)
Patient encouraged to maintain heart healthy diet, regular exercise, adequate sleep. Consider daily probiotics. Take medications as prescribed. Reviewed colonoscopy and MGM all up to date. Pap and labwork done today

## 2020-06-01 NOTE — Assessment & Plan Note (Signed)
hgba1c acceptable, minimize simple carbs, increase exercise as tolerated.

## 2020-06-01 NOTE — Patient Instructions (Addendum)
Systolic 035-597 Diastolic 41-63 Pulse 84-53  Call if numbers are outside of range consistently then call   Encouraged increased hydration and fiber in diet. Daily probiotics. If bowels not moving can use MOM 2 tbls po in 4 oz of warm prune juice by mouth every 2-3 days. If no results then repeat in 4 hours with  Dulcolax suppository pr, may repeat again in 4 more hours as needed. Seek care if symptoms worsen. Consider daily Miralax and/or Dulcolax if symptoms persist.   Benefiber or metamucil and clear fluids 60-80   Preventive Care 62-49 Years Old, Female Preventive care refers to visits with your health care provider and lifestyle choices that can promote health and wellness. This includes:  A yearly physical exam. This may also be called an annual well check.  Regular dental visits and eye exams.  Immunizations.  Screening for certain conditions.  Healthy lifestyle choices, such as eating a healthy diet, getting regular exercise, not using drugs or products that contain nicotine and tobacco, and limiting alcohol use. What can I expect for my preventive care visit? Physical exam Your health care provider will check your:  Height and weight. This may be used to calculate body mass index (BMI), which tells if you are at a healthy weight.  Heart rate and blood pressure.  Skin for abnormal spots. Counseling Your health care provider may ask you questions about your:  Alcohol, tobacco, and drug use.  Emotional well-being.  Home and relationship well-being.  Sexual activity.  Eating habits.  Work and work Statistician.  Method of birth control.  Menstrual cycle.  Pregnancy history. What immunizations do I need?  Influenza (flu) vaccine  This is recommended every year. Tetanus, diphtheria, and pertussis (Tdap) vaccine  You may need a Td booster every 10 years. Varicella (chickenpox) vaccine  You may need this if you have not been vaccinated. Zoster (shingles)  vaccine  You may need this after age 62. Measles, mumps, and rubella (MMR) vaccine  You may need at least one dose of MMR (62) if you were born in 1957 or later. You may also need a second dose. Pneumococcal conjugate (PCV13) vaccine  You may need this if you have certain conditions and were not previously vaccinated. Pneumococcal polysaccharide (PPSV23) vaccine  You may need one or two doses if you smoke cigarettes or if you have certain conditions. Meningococcal conjugate (MenACWY) vaccine  You may need this if you have certain conditions. Hepatitis A vaccine  You may need this if you have certain conditions or if you travel or work in places where you may be exposed to hepatitis A. Hepatitis B vaccine  You may need this if you have certain conditions or if you travel or work in places where you may be exposed to hepatitis B. Haemophilus influenzae type b (Hib) vaccine  You may need this if you have certain conditions. Human papillomavirus (HPV) vaccine  If recommended by your health care provider, you may need three doses over 6 months. You may receive vaccines as individual doses or as more than one vaccine together in one shot (combination vaccines). Talk with your health care provider about the risks and benefits of combination vaccines. What tests do I need? Blood tests  Lipid and cholesterol levels. These may be checked every 5 years, or more frequently if you are over 39 years old.  Hepatitis C test.  Hepatitis B test. Screening  Lung cancer screening. You may have this screening every year starting at age 39  if you have a 30-pack-year history of smoking and currently smoke or have quit within the past 15 years.  Colorectal cancer screening. All adults should have this screening starting at age 14 and continuing until age 68. Your health care provider may recommend screening at age 83 if you are at increased risk. You will have tests every 1-10 years, depending on your  results and the type of screening test.  Diabetes screening. This is done by checking your blood sugar (glucose) after you have not eaten for a while (fasting). You may have this done every 1-3 years.  Mammogram. This may be done every 1-2 years. Talk with your health care provider about when you should start having regular mammograms. This may depend on whether you have a family history of breast cancer.  BRCA-related cancer screening. This may be done if you have a family history of breast, ovarian, tubal, or peritoneal cancers.  Pelvic exam and Pap test. This may be done every 3 years starting at age 59. Starting at age 82, this may be done every 5 years if you have a Pap test in combination with an HPV test. Other tests  Sexually transmitted disease (STD) testing.  Bone density scan. This is done to screen for osteoporosis. You may have this scan if you are at high risk for osteoporosis. Follow these instructions at home: Eating and drinking  Eat a diet that includes fresh fruits and vegetables, whole grains, lean protein, and low-fat dairy.  Take vitamin and mineral supplements as recommended by your health care provider.  Do not drink alcohol if: ? Your health care provider tells you not to drink. ? You are pregnant, may be pregnant, or are planning to become pregnant.  If you drink alcohol: ? Limit how much you have to 0-1 drink a day. ? Be aware of how much alcohol is in your drink. In the [place] one drink equals one 12 oz bottle of beer (355 mL), one 5 oz glass of wine (148 mL), or one 1 oz glass of hard liquor (44 mL). Lifestyle  Take daily care of your teeth and gums.  Stay active. Exercise for at least 30 minutes on 5 or more days each week.  Do not use any products that contain nicotine or tobacco, such as cigarettes, e-cigarettes, and chewing tobacco. If you need help quitting, ask your health care provider.  If you are sexually active, practice safe sex. Use a  condom or other form of birth control (contraception) in order to prevent pregnancy and STIs (sexually transmitted infections).  If told by your health care provider, take low-dose aspirin daily starting at age 59. What's next?  Visit your health care provider once a year for a well check visit.  Ask your health care provider how often you should have your eyes and teeth checked.  Stay up to date on all vaccines. This information is not intended to replace advice given to you by your health care provider. Make sure you discuss any questions you have with your health care provider. Document Revised: 08/02/2018 Document Reviewed: 08/02/2018 Elsevier Patient Education  2020 [employer].

## 2020-06-02 LAB — CERVICOVAGINAL ANCILLARY ONLY
Bacterial Vaginitis (gardnerella): NEGATIVE
Candida Glabrata: NEGATIVE
Candida Vaginitis: NEGATIVE
Comment: NEGATIVE
Comment: NEGATIVE
Comment: NEGATIVE

## 2020-06-02 LAB — CYTOLOGY - PAP: Diagnosis: NEGATIVE

## 2020-06-03 ENCOUNTER — Other Ambulatory Visit: Payer: Self-pay

## 2020-06-03 ENCOUNTER — Other Ambulatory Visit (INDEPENDENT_AMBULATORY_CARE_PROVIDER_SITE_OTHER): Payer: 59

## 2020-06-03 DIAGNOSIS — D508 Other iron deficiency anemias: Secondary | ICD-10-CM | POA: Diagnosis not present

## 2020-06-03 DIAGNOSIS — E559 Vitamin D deficiency, unspecified: Secondary | ICD-10-CM

## 2020-06-03 DIAGNOSIS — R7303 Prediabetes: Secondary | ICD-10-CM

## 2020-06-03 DIAGNOSIS — I10 Essential (primary) hypertension: Secondary | ICD-10-CM

## 2020-06-03 LAB — CBC
HCT: 40.8 % (ref 36.0–46.0)
Hemoglobin: 13.4 g/dL (ref 12.0–15.0)
MCHC: 32.9 g/dL (ref 30.0–36.0)
MCV: 86.4 fl (ref 78.0–100.0)
Platelets: 170 10*3/uL (ref 150.0–400.0)
RBC: 4.72 Mil/uL (ref 3.87–5.11)
RDW: 14.1 % (ref 11.5–15.5)
WBC: 7.1 10*3/uL (ref 4.0–10.5)

## 2020-06-03 LAB — VITAMIN D 25 HYDROXY (VIT D DEFICIENCY, FRACTURES): VITD: 74.45 ng/mL (ref 30.00–100.00)

## 2020-06-03 LAB — COMPREHENSIVE METABOLIC PANEL
ALT: 19 U/L (ref 0–35)
AST: 20 U/L (ref 0–37)
Albumin: 4.6 g/dL (ref 3.5–5.2)
Alkaline Phosphatase: 68 U/L (ref 39–117)
BUN: 18 mg/dL (ref 6–23)
CO2: 28 mEq/L (ref 19–32)
Calcium: 9.7 mg/dL (ref 8.4–10.5)
Chloride: 104 mEq/L (ref 96–112)
Creatinine, Ser: 0.9 mg/dL (ref 0.40–1.20)
GFR: 76.74 mL/min (ref >60.00)
Glucose, Bld: 86 mg/dL (ref 70–99)
Potassium: 4.4 mEq/L (ref 3.5–5.1)
Sodium: 140 mEq/L (ref 135–145)
Total Bilirubin: 0.4 mg/dL (ref 0.2–1.2)
Total Protein: 7.7 g/dL (ref 6.0–8.3)

## 2020-06-03 LAB — LIPID PANEL
Cholesterol: 180 mg/dL (ref 0–200)
HDL: 64.4 mg/dL (ref >39.00)
LDL Cholesterol: 103 mg/dL — ABNORMAL HIGH (ref 0–99)
NonHDL: 115.45
Total CHOL/HDL Ratio: 3
Triglycerides: 61 mg/dL (ref 0.0–149.0)
VLDL: 12.2 mg/dL (ref 0.0–40.0)

## 2020-06-03 LAB — TSH: TSH: 2.22 u[IU]/mL (ref 0.35–4.50)

## 2020-06-03 LAB — HEMOGLOBIN A1C: Hgb A1c MFr Bld: 5.7 % (ref 4.6–6.5)

## 2020-06-15 ENCOUNTER — Encounter (INDEPENDENT_AMBULATORY_CARE_PROVIDER_SITE_OTHER): Payer: Self-pay | Admitting: Family Medicine

## 2020-06-15 DIAGNOSIS — I10 Essential (primary) hypertension: Secondary | ICD-10-CM

## 2020-06-15 MED ORDER — AMLODIPINE BESYLATE 2.5 MG PO TABS: 2.5000 mg | ORAL_TABLET | Freq: Every day | ORAL | 0 refills | Status: DC

## 2020-07-09 ENCOUNTER — Other Ambulatory Visit: Payer: Self-pay | Admitting: Family Medicine

## 2020-07-09 DIAGNOSIS — Z1231 Encounter for screening mammogram for malignant neoplasm of breast: Secondary | ICD-10-CM

## 2020-07-21 ENCOUNTER — Encounter (INDEPENDENT_AMBULATORY_CARE_PROVIDER_SITE_OTHER): Payer: Self-pay | Admitting: Family Medicine

## 2020-07-21 DIAGNOSIS — I10 Essential (primary) hypertension: Secondary | ICD-10-CM

## 2020-07-22 ENCOUNTER — Ambulatory Visit
Admission: RE | Admit: 2020-07-22 | Discharge: 2020-07-22 | Disposition: A | Discharge status: Home / Self Care | Payer: 59 | Source: Ambulatory Visit | Attending: Family Medicine | Admitting: Family Medicine

## 2020-07-22 ENCOUNTER — Other Ambulatory Visit: Payer: Self-pay

## 2020-07-22 DIAGNOSIS — Z1231 Encounter for screening mammogram for malignant neoplasm of breast: Secondary | ICD-10-CM

## 2020-07-22 MED ORDER — AMLODIPINE BESYLATE 2.5 MG PO TABS: 2.5000 mg | ORAL_TABLET | Freq: Every day | ORAL | 0 refills | Status: DC

## 2020-07-22 NOTE — Telephone Encounter (Signed)
FYI refill has been sent to pharmacy.

## 2020-07-27 ENCOUNTER — Ambulatory Visit (INDEPENDENT_AMBULATORY_CARE_PROVIDER_SITE_OTHER)
Admission: RE | Admit: 2020-07-27 | Discharge: 2020-07-27 | Disposition: A | Discharge status: Home / Self Care | Payer: 59 | Source: Ambulatory Visit | Attending: Family Medicine | Admitting: Family Medicine

## 2020-07-27 ENCOUNTER — Ambulatory Visit (INDEPENDENT_AMBULATORY_CARE_PROVIDER_SITE_OTHER): Payer: 59 | Admitting: Family Medicine

## 2020-07-27 ENCOUNTER — Encounter: Payer: Self-pay | Admitting: Family Medicine

## 2020-07-27 ENCOUNTER — Other Ambulatory Visit: Payer: Self-pay

## 2020-07-27 VITALS — BP 124/80 | HR 70 | Ht 61.0 in | Wt 157.0 lb

## 2020-07-27 DIAGNOSIS — M7061 Trochanteric bursitis, right hip: Secondary | ICD-10-CM | POA: Diagnosis not present

## 2020-07-27 DIAGNOSIS — G8929 Other chronic pain: Secondary | ICD-10-CM

## 2020-07-27 DIAGNOSIS — M545 Low back pain, unspecified: Secondary | ICD-10-CM

## 2020-07-27 DIAGNOSIS — M25562 Pain in left knee: Secondary | ICD-10-CM

## 2020-07-27 DIAGNOSIS — M1711 Unilateral primary osteoarthritis, right knee: Secondary | ICD-10-CM | POA: Diagnosis not present

## 2020-07-27 DIAGNOSIS — M25561 Pain in right knee: Secondary | ICD-10-CM

## 2020-07-27 DIAGNOSIS — M7062 Trochanteric bursitis, left hip: Secondary | ICD-10-CM

## 2020-07-27 MED ORDER — MELOXICAM 7.5 MG PO TABS: 7.5000 mg | ORAL_TABLET | Freq: Every day | ORAL | 0 refills | Status: DC

## 2020-07-27 MED ORDER — GABAPENTIN 100 MG PO CAPS: 200.0000 mg | ORAL_CAPSULE | Freq: Every day | ORAL | 0 refills | Status: DC

## 2020-07-27 NOTE — Progress Notes (Signed)
[name] [address] [place] [address] Phone: [phone] Subjective:   I Jenna Johnson am serving as a Neurosurgeon for Dr. Antoine Primas.  This visit occurred during the SARS-CoV-2 public health emergency.  Safety protocols were in place, including screening questions prior to the visit, additional usage of staff PPE, and extensive cleaning of exam room while observing appropriate contact time as indicated for disinfecting solutions.   I'm seeing this patient by the request  of:  Bradd Canary, MD  CC: Multiple complaints  [id number]   01/03/2018 Patient is doing remarkably well overall.  We will a lot of the different exercises seem to be what is contributing to some of the discomfort and pain.  We discussed home exercises.  Discussed which activities doing which wants to avoid.  Patient given topical anti-inflammatories.  Encourage patient to follow-up in 4 weeks if not completely resolved.  07/27/2020 Jenna Johnson is a 64 y.o. female coming in with complaint of bilateral knee, bilateral hip and elbow. Last seen in 2019 for knee pain. Patient states she has a burning sensation in the left knee on the medial side. Believes arthritis has gotten worse. Right elbow pain. States she has bumped it a few times. Pain radiates down the arm. History of tendonitis. About 15-16 years ago she received an injection in the elbow that helped. Left hip pain. Has had injections in the past. Glut pain. Pain sometimes radiates down the leg with a lot of walking. Right sided history of sciatica issues. Some sciatic issues on the left side. Sitting for long periods of time. States she gets a lot of leg cramps with decreased liquid intake. States she has been drinking Gatorade and lots of water.       Past Medical History:  Diagnosis Date  . Allergic state   . Allergy   . Anemia    long time ago per pt   . Arthritis of knee, right    and left knee   .  Back pain   . Bursitis of both hips   . Cataract    forming  right eye she thinks   . Chicken pox as a child  . Constipation   . Dermatitis 09/18/2013  . Endometriosis   . GERD (gastroesophageal reflux disease)    changed diet and better   . H/O atrial septal defect   . Hip pain, bilateral 09/18/2013  . Hypertension   . IBS (irritable bowel syndrome)   . Joint pain   . Lactose intolerance   . Leg edema   . Measles as a child  . Myalgia and myositis 09/18/2013  . Osteopenia    mild   . Preventative health care 01/07/2014  . TIA (transient ischemic attack) 2005  . UTI (urinary tract infection)    Past Surgical History:  Procedure Laterality Date  . ABDOMINAL HYSTERECTOMY  64 yrs old   still has one ovary  . APPENDECTOMY  64 yrs old  . ASD REPAIR  2006  . COLONOSCOPY     10 yr in [place]   . hole in heart    . RIGHT OOPHORECTOMY     ruptured ovarian cyst at age 85  . tia  surgery 2005   Social History   Socioeconomic History  . Marital status: Married    Spouse name: Chelan Heringer  . Number of children: 0  . Years of education: Not on file  . Highest education level: Not  on file  Occupational History  . Occupation: Retired  Tobacco Use  . Smoking status: Never Smoker  . Smokeless tobacco: Never Used  Substance and Sexual Activity  . Alcohol use: Yes    Comment: occasionally  . Drug use: No  . Sexual activity: Yes    Partners: Male  Other Topics Concern  . Not on file  Social History Narrative  . Not on file   Social Determinants of Health   Financial Resource Strain:   . Difficulty of Paying Living Expenses: Not on file  Food Insecurity:   . Worried About Programme researcher, broadcasting/film/video in the Last Year: Not on file  . Ran Out of Food in the Last Year: Not on file  Transportation Needs:   . Lack of Transportation (Medical): Not on file  . Lack of Transportation (Non-Medical): Not on file  Physical Activity:   . Days of Exercise per Week: Not on file  . Minutes of  Exercise per Session: Not on file  Stress:   . Feeling of Stress : Not on file  Social Connections:   . Frequency of Communication with Friends and Family: Not on file  . Frequency of Social Gatherings with Friends and Family: Not on file  . Attends Religious Services: Not on file  . Active Member of Clubs or Organizations: Not on file  . Attends Banker Meetings: Not on file  . Marital Status: Not on file   No Known Allergies Family History  Problem Relation Age of Onset  . Multiple sclerosis Mother   . Other Mother        blue tumor, air embolism,  . Hypertension Mother   . Obesity Mother   . Diabetes Father 64       type 2  . Other Father        fatty liver, lactose intolerant  . Thyroid disease Father   . Colon polyps Father   . Cancer Paternal Grandmother 52       breast  . Diabetes Paternal Grandmother   . Breast cancer Paternal Grandmother   . Other Sister        s/p hysterectomy  . Other Sister        s/p hysterectomy  . Colon cancer Neg Hx   . Esophageal cancer Neg Hx   . Stomach cancer Neg Hx   . Rectal cancer Neg Hx      Current Outpatient Medications (Cardiovascular):  .  amLODipine (NORVASC) 2.5 MG tablet, Take 1 tablet (2.5 mg total) by mouth daily.  Current Outpatient Medications (Respiratory):  .  loratadine (CLARITIN) 10 MG tablet, Take 10 mg by mouth daily as needed.   Current Outpatient Medications (Analgesics):  .  acetaminophen (TYLENOL) 650 MG CR tablet, Take 650 mg by mouth every 8 (eight) hours as needed for pain. Marland Kitchen  aspirin EC 81 MG tablet, Take 1 tablet (81 mg total) by mouth daily. Marland Kitchen  ibuprofen (ADVIL,MOTRIN) 200 MG tablet, Take 200 mg by mouth every 6 (six) hours as needed. .  meloxicam (MOBIC) 7.5 MG tablet, Take 1 tablet (7.5 mg total) by mouth daily.   Current Outpatient Medications (Other):  .  cholecalciferol (VITAMIN D3) 25 MCG (1000 UT) tablet, Take 2,000 Units by mouth daily. .  fluconazole (DIFLUCAN) 150 MG  tablet, Take 1 tablet by mouth today. May repeat in 3 days if symptoms are not improved. .  Sodium Sulfate-Mag Sulfate-KCl (SUTAB) [phone] MG TABS, Take 24 tablets by mouth as  directed. .  gabapentin (NEURONTIN) 100 MG capsule, Take 2 capsules (200 mg total) by mouth at bedtime.   Reviewed prior external information including notes and imaging from  primary care provider As well as notes that were available from care everywhere and other healthcare systems.  Past medical history, social, surgical and family history all reviewed in electronic medical record.  No pertanent information unless stated regarding to the chief complaint.   Review of Systems:  No headache, visual changes, nausea, vomiting, diarrhea, constipation, dizziness, abdominal pain, skin rash, fevers, chills, night sweats, weight loss, swollen lymph nodes, body aches, joint swelling, chest pain, shortness of breath, mood changes. POSITIVE muscle aches  Objective  Blood pressure 124/80, pulse 70, height 5\' 1"  (1.549 m), weight 157 lb (71.2 kg), SpO2 98 %.   General: No apparent distress alert and oriented x3 mood and affect normal, dressed appropriately.  HEENT: Pupils equal, extraocular movements intact  Respiratory: Patient's speak in full sentences and does not appear short of breath  Cardiovascular: No lower extremity edema, non tender, no erythema  Neuro: Cranial nerves II through XII are intact, neurovascularly intact in all extremities with 2+ DTRs and 2+ pulses.  Gait normal with good balance and coordination.  MSK:   Back exam shows the patient does have some mild loss of lordosis, significant tightness with FABER test right greater than left.  Patient does have pain on both sides.  No true radicular symptoms.  Moderate arthritic changes of the knees bilaterally with very mild instability with valgus and varus force.  Neurovascularly intact distally.  Does have tightness of the hamstrings bilaterally..  Mild pain  of the bilateral greater trochanteric area    Impression and Recommendations:     The above documentation has been reviewed and is accurate and complete , DO       Note: This dictation was prepared with Dragon dictation along with smaller phrase technology. Any transcriptional errors that result from this process are unintentional.

## 2020-07-27 NOTE — Assessment & Plan Note (Signed)
Questionable sciatica.  We discussed with patient in great length about icing regimen, home exercise, which activities to do which wants to avoid.  Discussed posture and ergonomics gabapentin and meloxicam and follow-up again in 4 to 8 weeks

## 2020-07-27 NOTE — Patient Instructions (Signed)
Avoid overhand lifting Vertical mouse Do not rest elbow on anything Exercises See me again in 6-8 weeks

## 2020-07-27 NOTE — Assessment & Plan Note (Signed)
History of inflammation bilaterally.  Consider the possibility of injections again.  Has been 5 years since we have done so.  Patient is in agreement with the plan starting with meloxicam and gabapentin

## 2020-07-27 NOTE — Assessment & Plan Note (Signed)
Known arthritic changes of the knees bilaterally.  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  Patient has done well with the brace previously and we discussed it again.  Discussed potential injections necessary.  Do feel that some of the radicular symptoms could be coming from the back and x-rays ordered today.  Gabapentin and meloxicam prescribed.  Follow-up again in 4 to 8 weeks

## 2020-08-21 ENCOUNTER — Other Ambulatory Visit: Payer: Self-pay | Admitting: Family Medicine

## 2020-08-25 ENCOUNTER — Encounter (INDEPENDENT_AMBULATORY_CARE_PROVIDER_SITE_OTHER): Payer: Self-pay | Admitting: Family Medicine

## 2020-08-25 ENCOUNTER — Ambulatory Visit (INDEPENDENT_AMBULATORY_CARE_PROVIDER_SITE_OTHER): Payer: PRIVATE HEALTH INSURANCE | Admitting: Family Medicine

## 2020-08-25 ENCOUNTER — Other Ambulatory Visit: Payer: Self-pay

## 2020-08-25 VITALS — BP 122/68 | HR 64 | Temp 97.6°F | Ht 61.0 in | Wt 150.0 lb

## 2020-08-25 DIAGNOSIS — E669 Obesity, unspecified: Secondary | ICD-10-CM

## 2020-08-25 DIAGNOSIS — I10 Essential (primary) hypertension: Secondary | ICD-10-CM | POA: Diagnosis not present

## 2020-08-25 DIAGNOSIS — Z6830 Body mass index (BMI) 30.0-30.9, adult: Secondary | ICD-10-CM | POA: Diagnosis not present

## 2020-08-25 DIAGNOSIS — R7303 Prediabetes: Secondary | ICD-10-CM | POA: Diagnosis not present

## 2020-08-25 MED ORDER — AMLODIPINE BESYLATE 2.5 MG PO TABS: 2.5000 mg | ORAL_TABLET | Freq: Every day | ORAL | 1 refills | Status: DC

## 2020-08-26 ENCOUNTER — Encounter (INDEPENDENT_AMBULATORY_CARE_PROVIDER_SITE_OTHER): Payer: Self-pay | Admitting: Family Medicine

## 2020-08-26 IMAGING — MG DIGITAL SCREENING BILAT W/ TOMO W/ CAD
6 of 10 series · 6 of 30 positions shown · non-contrast
Comparison: Previous exam(s).

CLINICAL DATA: Screening.

EXAM:
DIGITAL SCREENING BILATERAL MAMMOGRAM WITH TOMO AND CAD

[R CC synth-2D]
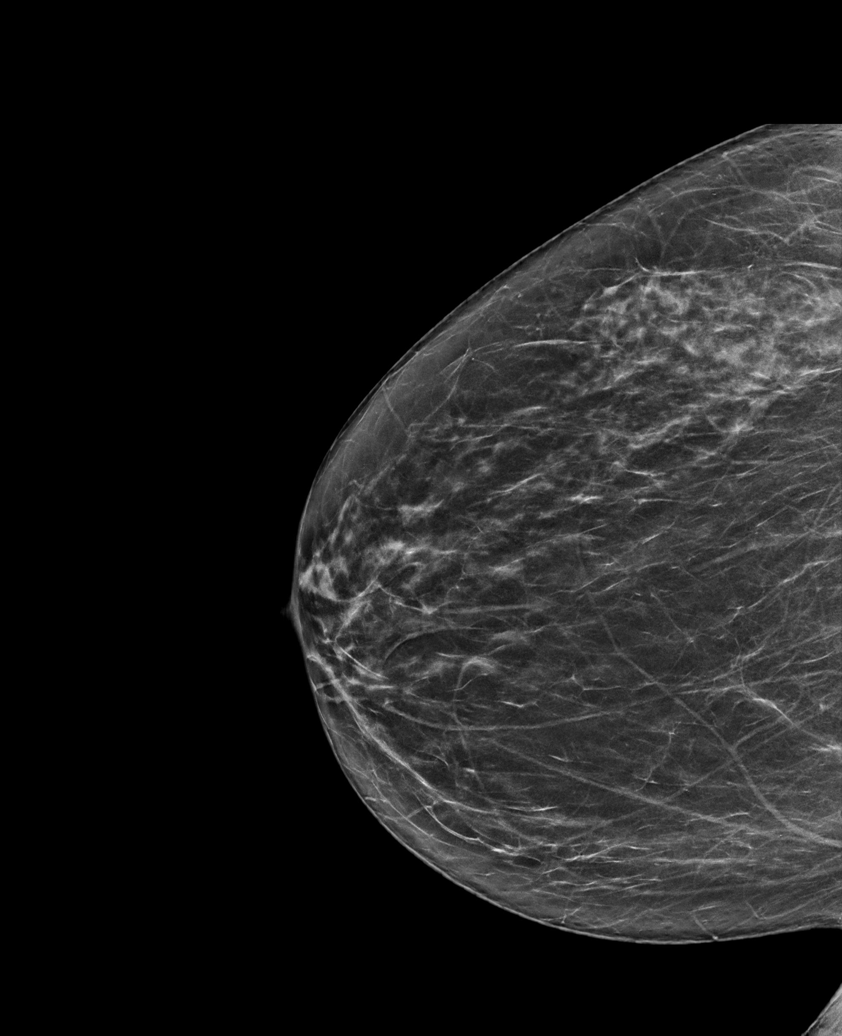

[L MLO synth-2D (1 of 2)]
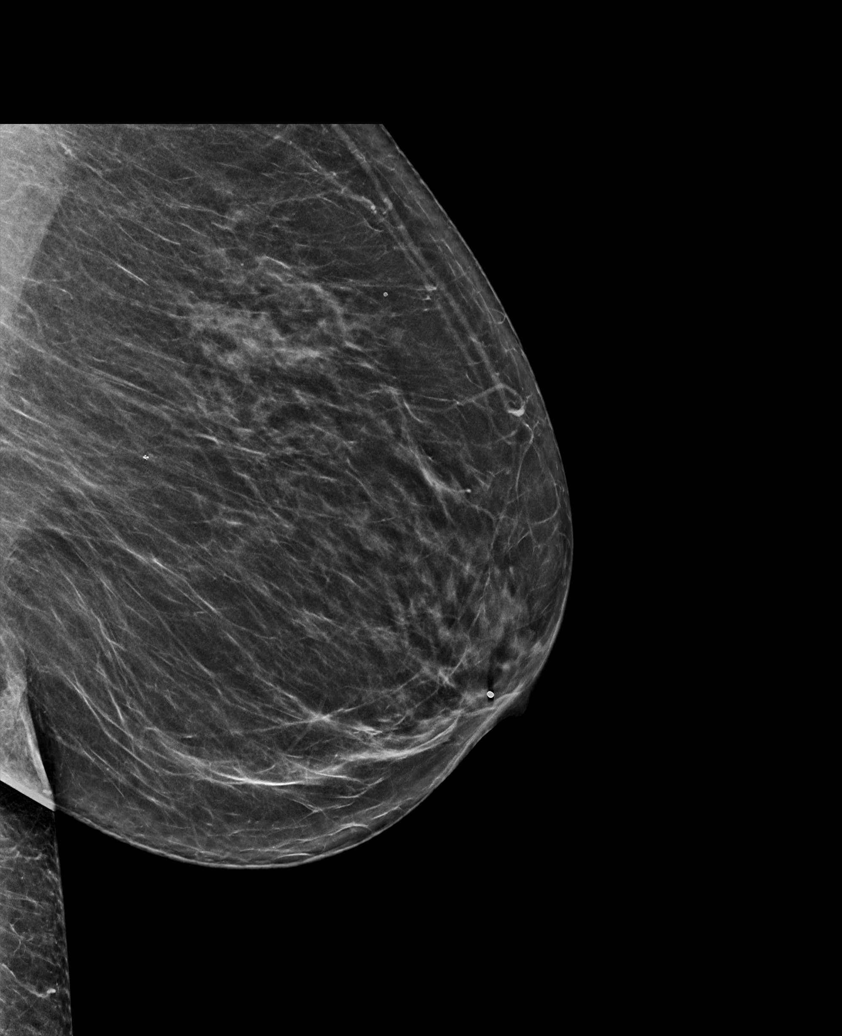

[L MLO synth-2D (2 of 2)]
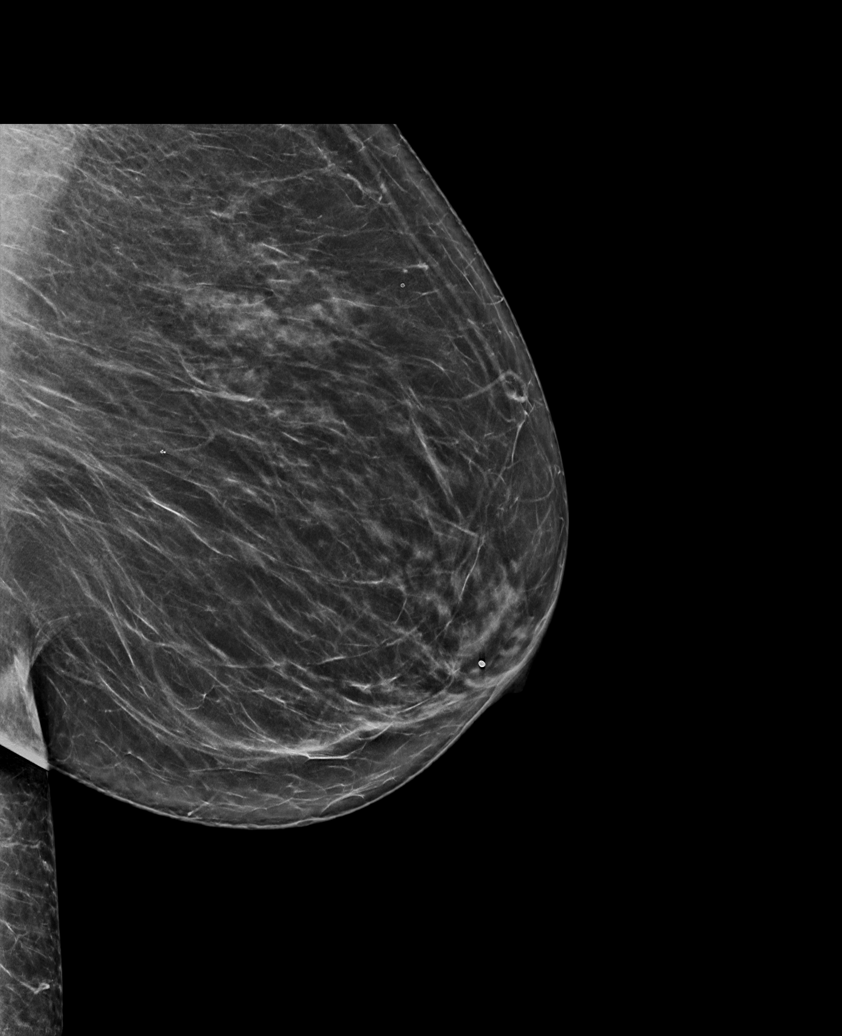

[R MLO synth-2D]
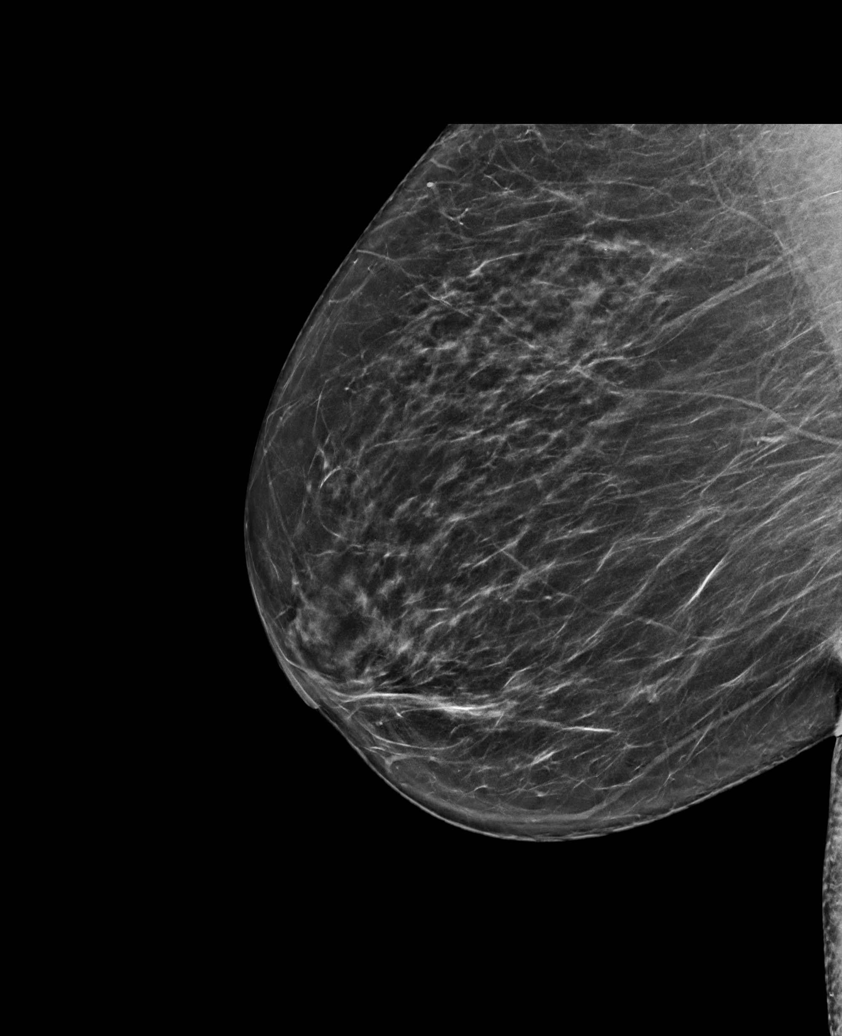

[L CC synth-2D]
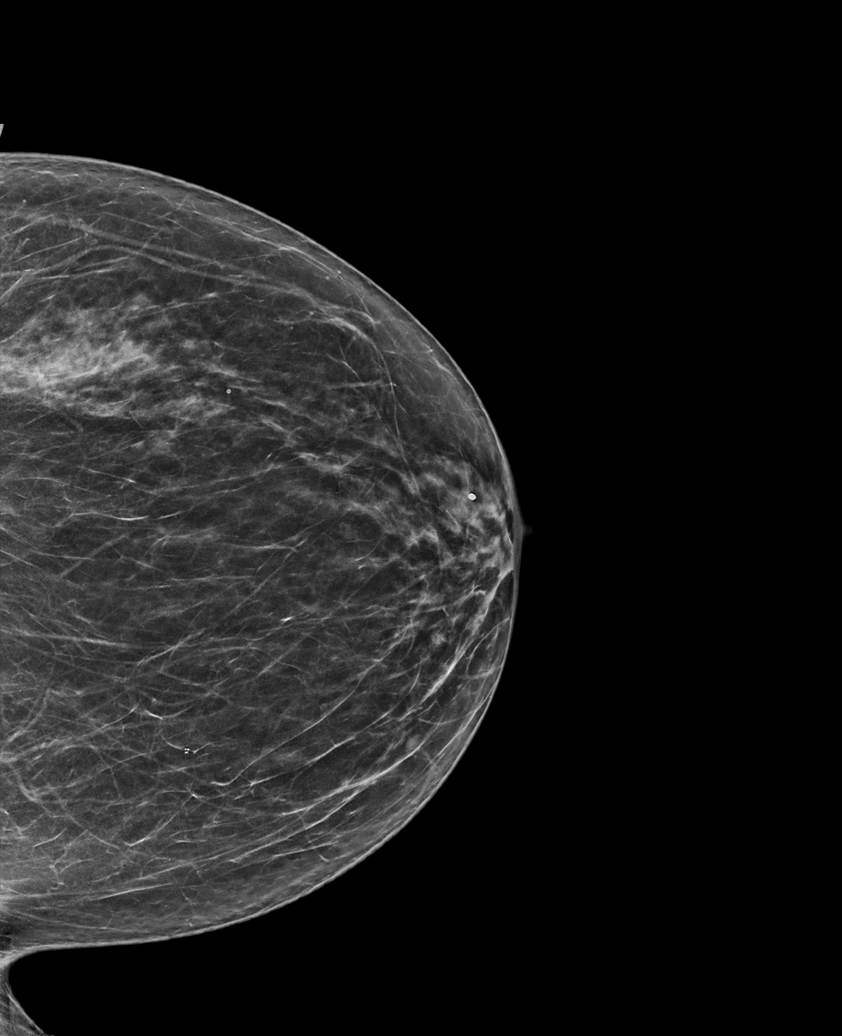

[R CC tomo · tomo slice 33/66.0]
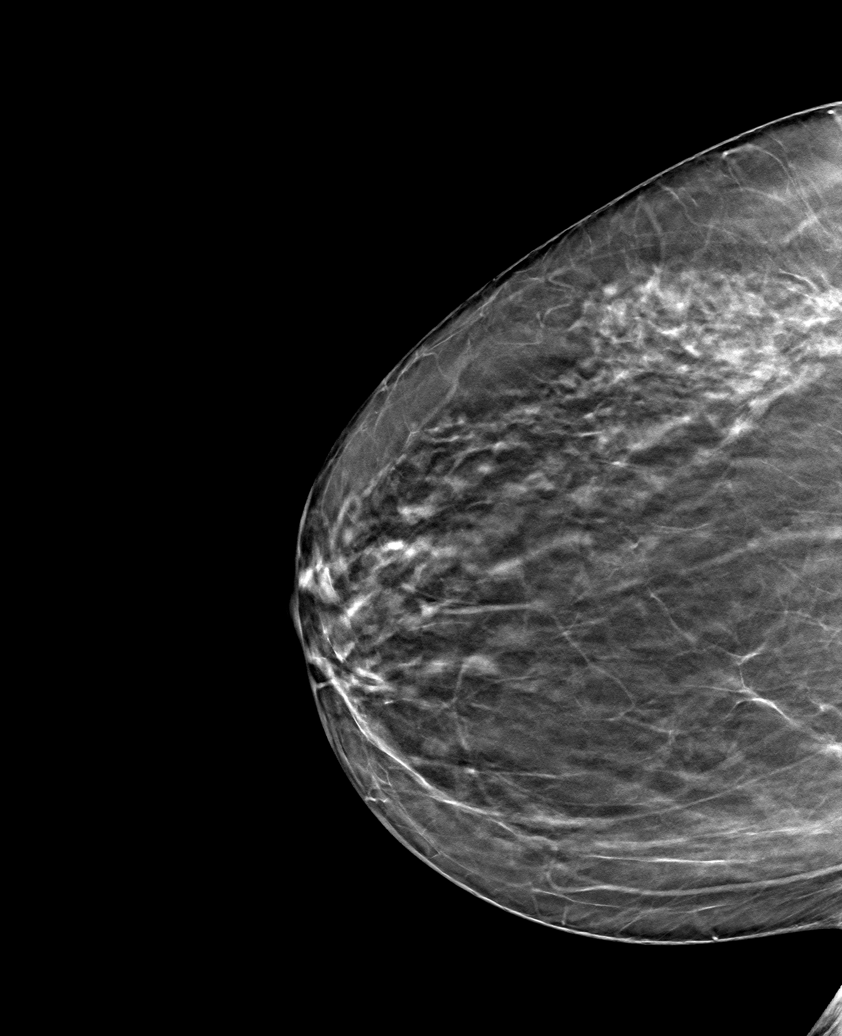

[6 of 30 positions shown; findings below may reference images not displayed]

ACR Breast Density Category c: The breast tissue is heterogeneously
dense, which may obscure small masses.
FINDINGS: There are no findings suspicious for malignancy. Images were
processed with CAD.
IMPRESSION: No mammographic evidence of malignancy. A result letter of this
screening mammogram will be mailed directly to the patient.

RECOMMENDATION:
Screening mammogram in one year. (Code:[id number])

BI-RADS CATEGORY  1: Negative.

## 2020-08-26 NOTE — Progress Notes (Signed)
Chief Complaint:   OBESITY Jenna Johnson is here to discuss her progress with her obesity treatment plan along with follow-up of her obesity related diagnoses. Jenna Johnson is on following a lower carbohydrate, vegetable and lean protein rich diet plan and states she is following her eating plan approximately 0% of the time. Jenna Johnson states she is walking and swimming for 40-45 minutes 3-4 times per week.  Today's visit was #: 30 Starting weight: 171 lbs Starting date: 08/27/2018 Today's weight: 150 lbs Today's date: 08/25/2020 Total lbs lost to date: 21 Total lbs lost since last in-office visit: 8  Interim History: Jenna Johnson is journaling daily using Noom. She is down 8 lbs and she is close to her weight goal of 140-145 lbs. She is eating about 1,200 calories per day. She is having protein at all meals.  Subjective:   1. Essential hypertension Jenna Johnson's blood pressure is well controlled with amlodipine 2.5 mg.  She reports no side effects including LEE.   BP Readings from Last 3 Encounters:  08/25/20 122/68  07/27/20 124/80  06/01/20 (!) 129/54   Lab Results  Component Value Date   CREATININE 0.90 06/03/2020   CREATININE 0.90 10/16/2019   CREATININE 0.93 12/26/2018   2. Pre-diabetes Jenna Johnson's last A1c was 5.7, which is down from 5.9. She is not on metformin.   Lab Results  Component Value Date   HGBA1C 5.7 06/03/2020   Lab Results  Component Value Date   INSULIN 8.4 08/27/2018   Assessment/Plan:   1. Essential hypertension . We will refill amlodipine with a 90 day supply, with 1 refill.   - amLODipine (NORVASC) 2.5 MG tablet; Take 1 tablet (2.5 mg total) by mouth daily.  Dispense: 90 tablet; Refill: 1  2. Pre-diabetes Jenna Johnson will continue her meal plan, and will continue to work on weight loss, exercise, and decreasing simple carbohydrates to help decrease the risk of diabetes.   3. Class 1 obesity with serious comorbidity and body mass index  (BMI) of 30.0 to 30.9 in adult, unspecified obesity type Jenna Johnson is currently in the action stage of change. As such, her goal is to continue with weight loss efforts. She has agreed to continue journaling with Noom.   Exercise goals: As is.  Behavioral modification strategies: increasing lean protein intake.  Jenna Johnson requests to follow-up with our clinic in February 2022.  Objective:   Blood pressure 122/68, pulse 64, temperature 97.6 F (36.4 C), temperature source Oral, height 5\' 1"  (1.549 m), weight 150 lb (68 kg), SpO2 99 %. Body mass index is 28.34 kg/m.  General: Cooperative, alert, well developed, in no acute distress. HEENT: Conjunctivae and lids unremarkable. Cardiovascular: Regular rhythm.  Lungs: Normal work of breathing. Neurologic: No focal deficits.   Lab Results  Component Value Date   CREATININE 0.90 06/03/2020   BUN 18 06/03/2020   NA 140 06/03/2020   K 4.4 06/03/2020   CL 104 06/03/2020   CO2 28 06/03/2020   Lab Results  Component Value Date   ALT 19 06/03/2020   AST 20 06/03/2020   ALKPHOS 68 06/03/2020   BILITOT 0.4 06/03/2020   Lab Results  Component Value Date   HGBA1C 5.7 06/03/2020   HGBA1C 5.8 10/16/2019   HGBA1C 5.9 12/26/2018   HGBA1C 5.9 (H) 08/27/2018   Lab Results  Component Value Date   INSULIN 8.4 08/27/2018   Lab Results  Component Value Date   TSH 2.22 06/03/2020   Lab Results  Component Value Date   CHOL  180 06/03/2020   HDL 64.40 06/03/2020   LDLCALC 103 (H) 06/03/2020   TRIG 61.0 06/03/2020   CHOLHDL 3 06/03/2020   Lab Results  Component Value Date   WBC 7.1 06/03/2020   HGB 13.4 06/03/2020   HCT 40.8 06/03/2020   MCV 86.4 06/03/2020   PLT 170.0 06/03/2020   No results found for: IRON, TIBC, FERRITIN  Attestation Statements:   Reviewed by clinician on day of visit: allergies, medications, problem list, medical history, surgical history, family history, social history, and previous encounter  notes.   Trude Mcburney, am acting as Energy manager for [place], FNP-C.  I have reviewed the above documentation for accuracy and completeness, and I agree with the above. -  Jesse Sans, FNP

## 2020-09-10 ENCOUNTER — Ambulatory Visit (INDEPENDENT_AMBULATORY_CARE_PROVIDER_SITE_OTHER): Payer: 59 | Admitting: Family Medicine

## 2020-09-10 ENCOUNTER — Encounter: Payer: Self-pay | Admitting: Family Medicine

## 2020-09-10 ENCOUNTER — Other Ambulatory Visit: Payer: Self-pay

## 2020-09-10 VITALS — BP 118/82 | HR 56 | Ht 61.0 in | Wt 155.0 lb

## 2020-09-10 DIAGNOSIS — M2550 Pain in unspecified joint: Secondary | ICD-10-CM | POA: Diagnosis not present

## 2020-09-10 DIAGNOSIS — M25531 Pain in right wrist: Secondary | ICD-10-CM | POA: Insufficient documentation

## 2020-09-10 LAB — CBC WITH DIFFERENTIAL/PLATELET
Basophils Absolute: 0 10*3/uL (ref 0.0–0.1)
Basophils Relative: 0.4 % (ref 0.0–3.0)
Eosinophils Absolute: 0.1 10*3/uL (ref 0.0–0.7)
Eosinophils Relative: 1.4 % (ref 0.0–5.0)
HCT: 40.8 % (ref 36.0–46.0)
Hemoglobin: 13.3 g/dL (ref 12.0–15.0)
Lymphocytes Relative: 27.6 % (ref 12.0–46.0)
Lymphs Abs: 2 10*3/uL (ref 0.7–4.0)
MCHC: 32.6 g/dL (ref 30.0–36.0)
MCV: 86.7 fl (ref 78.0–100.0)
Monocytes Absolute: 0.6 10*3/uL (ref 0.1–1.0)
Monocytes Relative: 8.3 % (ref 3.0–12.0)
Neutro Abs: 4.6 10*3/uL (ref 1.4–7.7)
Neutrophils Relative %: 62.3 % (ref 43.0–77.0)
Platelets: 151 10*3/uL (ref 150.0–400.0)
RBC: 4.7 Mil/uL (ref 3.87–5.11)
RDW: 13.9 % (ref 11.5–15.5)
WBC: 7.4 10*3/uL (ref 4.0–10.5)

## 2020-09-10 LAB — COMPREHENSIVE METABOLIC PANEL
ALT: 18 U/L (ref 0–35)
AST: 21 U/L (ref 0–37)
Albumin: 4.3 g/dL (ref 3.5–5.2)
Alkaline Phosphatase: 72 U/L (ref 39–117)
BUN: 16 mg/dL (ref 6–23)
CO2: 32 mEq/L (ref 19–32)
Calcium: 9.5 mg/dL (ref 8.4–10.5)
Chloride: 105 mEq/L (ref 96–112)
Creatinine, Ser: 0.95 mg/dL (ref 0.40–1.20)
GFR: 64.02 mL/min (ref >60.00)
Glucose, Bld: 83 mg/dL (ref 70–99)
Potassium: 3.8 mEq/L (ref 3.5–5.1)
Sodium: 141 mEq/L (ref 135–145)
Total Bilirubin: 0.4 mg/dL (ref 0.2–1.2)
Total Protein: 7.7 g/dL (ref 6.0–8.3)

## 2020-09-10 LAB — VITAMIN D 25 HYDROXY (VIT D DEFICIENCY, FRACTURES): VITD: 56.18 ng/mL (ref 30.00–100.00)

## 2020-09-10 LAB — TSH: TSH: 1.18 u[IU]/mL (ref 0.35–4.50)

## 2020-09-10 LAB — SEDIMENTATION RATE: Sed Rate: 27 mm/hr (ref 0–30)

## 2020-09-10 LAB — IBC PANEL
Iron: 103 ug/dL (ref 42–145)
Saturation Ratios: 36.2 % (ref 20.0–50.0)
Transferrin: 203 mg/dL — ABNORMAL LOW (ref 212.0–360.0)

## 2020-09-10 LAB — URIC ACID: Uric Acid, Serum: 4.6 mg/dL (ref 2.4–7.0)

## 2020-09-10 LAB — VITAMIN B12: Vitamin B-12: 732 pg/mL (ref 211–911)

## 2020-09-10 LAB — C-REACTIVE PROTEIN: CRP: <1 mg/dL (ref 0.5–20.0)

## 2020-09-10 NOTE — Assessment & Plan Note (Signed)
Nonspecific.  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  Discussed home exercises and taping for now with repetitive motion.  Continue meloxicam follow-up in 6 weeks

## 2020-09-10 NOTE — Assessment & Plan Note (Signed)
Patient is having pain out of proportion to the amount of arthritic changes the patient has.  Patient is making some improvement with the meloxicam which is beneficial.  We discussed we could potentially increase dosing but patient wants to hold at the moment.  We discussed with patient though I do think that a laboratory work-up to rule out any autoimmune disease would be beneficial at this time as well as other inflammatory markers.  Patient is having a lot of different symptoms that are actually bilateral as well.  Follow-up after testing in 6 weeks.

## 2020-09-10 NOTE — Patient Instructions (Addendum)
Tape with crocheting Continue meloxicam for now Labs today Have another appointment in 6 weeks

## 2020-09-10 NOTE — Progress Notes (Signed)
[name] [address] [place] [address] Phone: [phone] Subjective:   Jenna Johnson, am serving as a scribe for Dr. Antoine Primas. This visit occurred during the SARS-CoV-2 public health emergency.  Safety protocols were in place, including screening questions prior to the visit, additional usage of staff PPE, and extensive cleaning of exam room while observing appropriate contact time as indicated for disinfecting solutions.   I'm seeing this patient by the request  of:  Bradd Canary, MD  CC: Back pain, knee pain, wrist pain  [id number]   07/27/2020 Questionable sciatica.  We discussed with patient in great length about icing regimen, home exercise, which activities to do which wants to avoid.  Discussed posture and ergonomics gabapentin and meloxicam and follow-up again in 4 to 8 weeks  History of inflammation bilaterally.  Consider the possibility of injections again.  Has been 5 years since we have done so.  Patient is in agreement with the plan starting with meloxicam and gabapentin  Known arthritic changes of the knees bilaterally.  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  Patient has done well with the brace previously and we discussed it again.  Discussed potential injections necessary.  Do feel that some of the radicular symptoms could be coming from the back and x-rays ordered today.  Gabapentin and meloxicam prescribed.  Follow-up again in 4 to 8 weeks  Update 09/10/2020 Jenna Johnson is a 64 y.o. female coming in with complaint of back, bilateral knee and hip pain. Patient states that her muscles are weak around her knees. Pain continues from last visit.   Also having right elbow and wrist pain. Having a hard time gripping items.   Patient notes turning feet in to relieve pain in hips. Did have correction to lower legs as child. Having a hard time sleeping on the  side due to pain. Unable to sleep on right side. Using meloxicam for pain. Using topical analgesic for pain relief as well. Has not been exercising as much which exacerbates her pain. Does have numbness in left leg if seated on toilet.  Patient states that overall she does think she is making improvement with the meloxicam.  Patient though is still not pain-free.   Patient did have x-rays at last exam that were independently visualized by me showing the patient had very minimal osteoarthritic changes. Past Medical History:  Diagnosis Date  . Allergic state   . Allergy   . Anemia    long time ago per pt   . Arthritis of knee, right    and left knee   . Back pain   . Bursitis of both hips   . Cataract    forming  right eye she thinks   . Chicken pox as a child  . Constipation   . Dermatitis 09/18/2013  . Endometriosis   . GERD (gastroesophageal reflux disease)    changed diet and better   . H/O atrial septal defect   . Hip pain, bilateral 09/18/2013  . Hypertension   . IBS (irritable bowel syndrome)   . Joint pain   . Lactose intolerance   . Leg edema   . Measles as a child  . Myalgia and myositis 09/18/2013  . Osteopenia    mild   . Preventative health care 01/07/2014  . TIA (transient ischemic attack) 2005  . UTI (urinary tract infection)    Past Surgical History:  Procedure Laterality Date  . ABDOMINAL HYSTERECTOMY  64 yrs old   still has one ovary  . APPENDECTOMY  64 yrs old  . ASD REPAIR  2006  . COLONOSCOPY     12 yr in [place]   . hole in heart    . RIGHT OOPHORECTOMY     ruptured ovarian cyst at age 89  . tia  surgery 2005   Social History   Socioeconomic History  . Marital status: Married    Spouse name: Jean Alejos  . Number of children: 0  . Years of education: Not on file  . Highest education level: Not on file  Occupational History  . Occupation: Retired  Tobacco Use  . Smoking status: Never Smoker  . Smokeless tobacco: Never Used  Substance and  Sexual Activity  . Alcohol use: Yes    Comment: occasionally  . Drug use: No  . Sexual activity: Yes    Partners: Male  Other Topics Concern  . Not on file  Social History Narrative  . Not on file   Social Determinants of Health   Financial Resource Strain:   . Difficulty of Paying Living Expenses: Not on file  Food Insecurity:   . Worried About Programme researcher, broadcasting/film/video in the Last Year: Not on file  . Ran Out of Food in the Last Year: Not on file  Transportation Needs:   . Lack of Transportation (Medical): Not on file  . Lack of Transportation (Non-Medical): Not on file  Physical Activity:   . Days of Exercise per Week: Not on file  . Minutes of Exercise per Session: Not on file  Stress:   . Feeling of Stress : Not on file  Social Connections:   . Frequency of Communication with Friends and Family: Not on file  . Frequency of Social Gatherings with Friends and Family: Not on file  . Attends Religious Services: Not on file  . Active Member of Clubs or Organizations: Not on file  . Attends Banker Meetings: Not on file  . Marital Status: Not on file   No Known Allergies Family History  Problem Relation Age of Onset  . Multiple sclerosis Mother   . Other Mother        blue tumor, air embolism,  . Hypertension Mother   . Obesity Mother   . Diabetes Father 71       type 2  . Other Father        fatty liver, lactose intolerant  . Thyroid disease Father   . Colon polyps Father   . Cancer Paternal Grandmother 78       breast  . Diabetes Paternal Grandmother   . Breast cancer Paternal Grandmother   . Other Sister        s/p hysterectomy  . Other Sister        s/p hysterectomy  . Colon cancer Neg Hx   . Esophageal cancer Neg Hx   . Stomach cancer Neg Hx   . Rectal cancer Neg Hx      Current Outpatient Medications (Cardiovascular):  .  amLODipine (NORVASC) 2.5 MG tablet, Take 1 tablet (2.5 mg total) by mouth daily.  Current Outpatient Medications  (Respiratory):  .  loratadine (CLARITIN) 10 MG tablet, Take 10 mg by mouth daily as needed.   Current Outpatient Medications (Analgesics):  .  acetaminophen (TYLENOL) 650 MG CR tablet, Take 650 mg by mouth every 8 (eight) hours as needed for pain. Marland Kitchen  aspirin EC 81 MG tablet, Take 1 tablet (81 mg total) by mouth daily. Marland Kitchen  ibuprofen (ADVIL,MOTRIN) 200 MG tablet, Take 200 mg by mouth every 6 (six) hours as needed. .  meloxicam (MOBIC) 7.5 MG tablet, TAKE 1 TABLET(7.5 MG) BY MOUTH DAILY   Current Outpatient Medications (Other):  .  cholecalciferol (VITAMIN D3) 25 MCG (1000 UT) tablet, Take 2,000 Units by mouth daily. .  fluconazole (DIFLUCAN) 150 MG tablet, Take 1 tablet by mouth today. May repeat in 3 days if symptoms are not improved. .  Sodium Sulfate-Mag Sulfate-KCl (SUTAB) [phone] MG TABS, Take 24 tablets by mouth as directed.   Reviewed prior external information including notes and imaging from  primary care provider As well as notes that were available from care everywhere and other healthcare systems.  Past medical history, social, surgical and family history all reviewed in electronic medical record.  No pertanent information unless stated regarding to the chief complaint.   Review of Systems:  No headache, visual changes, nausea, vomiting, diarrhea, constipation, dizziness, abdominal pain, skin rash, fevers, chills, night sweats, weight loss, swollen lymph nodes, body aches, joint swelling, chest pain, shortness of breath, mood changes. POSITIVE muscle aches  Objective  Blood pressure 118/82, pulse (!) 56, height 5\' 1"  (1.549 m), weight 155 lb (70.3 kg), SpO2 99 %.   General: No apparent distress alert and oriented x3 mood and affect normal, dressed appropriately.  HEENT: Pupils equal, extraocular movements intact  Respiratory: Patient's speak in full sentences and does not appear short of breath  Cardiovascular: No lower extremity edema, non tender, no erythema  Neuro:  Cranial nerves II through XII are intact, neurovascularly intact in all extremities with 2+ DTRs and 2+ pulses.  Gait normal with good balance and coordination.  MSK: Knees still have significant discomfort to palpation over the medial joint line.  Some very minimal instability of fields with valgus and varus force.  Mild lateral tracking of the patella noted. Back exam mild loss of lordosis but sitting comfortably in the chair.  Does have tenderness to palpation over the greater trochanteric area bilaterally.  Right wrist exam shows the patient does have tenderness over the abductor pollicis longus.  Very mild positive Finkelstein's.  Patient does have tenderness also over the ECU on the ulnar aspect.  Mild Tinel over the ulnar aspect but not over the carpal tunnel area.    Impression and Recommendations:     The above documentation has been reviewed and is accurate and complete , DO

## 2020-09-15 LAB — RHEUMATOID FACTOR: Rheumatoid fact SerPl-aCnc: <14 IU/mL (ref <14)

## 2020-09-15 LAB — CALCIUM, IONIZED: Calcium, Ion: 5.3 mg/dL (ref 4.8–5.6)

## 2020-09-15 LAB — PTH, INTACT AND CALCIUM
Calcium: 9.6 mg/dL (ref 8.6–10.4)
PTH: 7 pg/mL — ABNORMAL LOW (ref 14–64)

## 2020-09-15 LAB — CYCLIC CITRUL PEPTIDE ANTIBODY, IGG: Cyclic Citrullin Peptide Ab: <16 UNITS

## 2020-09-15 LAB — ANA: Anti Nuclear Antibody (ANA): NEGATIVE

## 2020-09-15 LAB — ANGIOTENSIN CONVERTING ENZYME: Angiotensin-Converting Enzyme: 20 U/L (ref 9–67)

## 2020-09-24 ENCOUNTER — Ambulatory Visit: Payer: 59 | Admitting: Physician Assistant

## 2020-10-22 ENCOUNTER — Other Ambulatory Visit: Payer: Self-pay

## 2020-10-22 ENCOUNTER — Ambulatory Visit (INDEPENDENT_AMBULATORY_CARE_PROVIDER_SITE_OTHER): Payer: 59 | Admitting: Family Medicine

## 2020-10-22 ENCOUNTER — Encounter (INDEPENDENT_AMBULATORY_CARE_PROVIDER_SITE_OTHER): Payer: Self-pay | Admitting: Family Medicine

## 2020-10-22 ENCOUNTER — Encounter: Payer: Self-pay | Admitting: Family Medicine

## 2020-10-22 VITALS — BP 132/88 | HR 76 | Ht 61.0 in | Wt 156.0 lb

## 2020-10-22 DIAGNOSIS — M7711 Lateral epicondylitis, right elbow: Secondary | ICD-10-CM | POA: Insufficient documentation

## 2020-10-22 DIAGNOSIS — M25531 Pain in right wrist: Secondary | ICD-10-CM

## 2020-10-22 MED ORDER — MELOXICAM 7.5 MG PO TABS: 7.5000 mg | ORAL_TABLET | Freq: Every day | ORAL | 0 refills | Status: DC

## 2020-10-22 NOTE — Assessment & Plan Note (Signed)
Patient symptoms now seem to be more corresponding with the lateral epicondylitis and at this point.  Seems to be worsening pain at the wrist all the way up the forearm with any type of resisted extension of the wrist.  We discussed with patient about bracing of the wrist.  Topical anti-inflammatories.  Will be referred to formal physical therapy.  Follow-up again 6 to 8 weeks

## 2020-10-22 NOTE — Patient Instructions (Addendum)
Good to see you Wrist brace PT will call you [facility]  Meloxicam daily for 5 days then as needed Do not use NSAIDS such as Advil or Aleve when taking Meloxicam. It is ok to use Tylenol for additional pain relief See me again in 6-8 weeks

## 2020-10-22 NOTE — Progress Notes (Signed)
[facility] [address] [place] [address] Phone: [phone] Subjective:   I Jenna Johnson am serving as a Neurosurgeon for Dr. Antoine Primas.  This visit occurred during the SARS-CoV-2 public health emergency.  Safety protocols were in place, including screening questions prior to the visit, additional usage of staff PPE, and extensive cleaning of exam room while observing appropriate contact time as indicated for disinfecting solutions.   I'm seeing this patient by the request  of:  Bradd Canary, MD  CC: Right wrist pain follow-up  DJS:[id number]   09/10/2020 Nonspecific.  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  Discussed home exercises and taping for now with repetitive motion.  Continue meloxicam follow-up in 6 weeks  Patient is having pain out of proportion to the amount of arthritic changes the patient has.  Patient is making some improvement with the meloxicam which is beneficial.  We discussed we could potentially increase dosing but patient wants to hold at the moment.  We discussed with patient though I do think that a laboratory work-up to rule out any autoimmune disease would be beneficial at this time as well as other inflammatory markers.  Patient is having a lot of different symptoms that are actually bilateral as well.  Follow-up after testing in 6 weeks.  Update 10/22/2020 Jenna Johnson is a 64 y.o. female coming in with complaint of right wrist pain. Patient states the wrist is still bothering her. Has been doing the exercises. Using her fingers causes pain. Ran out of meloxicam. Believes it help the wrist. Pain radiating down the forearm. Cold weather increases pain. Still weakness in the wrist. Hand tremor on  the left side.        Past Medical History:  Diagnosis Date  . Allergic state   . Allergy   . Anemia    long time ago per pt   . Arthritis of knee, right    and left knee   . Back pain   . Bursitis of both hips   . Cataract    forming  right eye she thinks   . Chicken pox as a child  . Constipation   . Dermatitis 09/18/2013  . Endometriosis   . GERD (gastroesophageal reflux disease)    changed diet and better   . H/O atrial septal defect   . Hip pain, bilateral 09/18/2013  . Hypertension   . IBS (irritable bowel syndrome)   . Joint pain   . Lactose intolerance   . Leg edema   . Measles as a child  . Myalgia and myositis 09/18/2013  . Osteopenia    mild   . Preventative health care 01/07/2014  . TIA (transient ischemic attack) 2005  . UTI (urinary tract infection)    Past Surgical History:  Procedure Laterality Date  . ABDOMINAL HYSTERECTOMY  64 yrs old   still has one ovary  . APPENDECTOMY  64 yrs old  . ASD REPAIR  2006  . COLONOSCOPY     12 yr in [place]   . hole in heart    . RIGHT OOPHORECTOMY     ruptured ovarian cyst at age 17  . tia  surgery 2005   Social History   Socioeconomic History  .  Marital status: Married    Spouse name: Jenna Johnson  . Number of children: 0  . Years of education: Not on file  . Highest education level: Not on file  Occupational History  . Occupation: Retired  Tobacco Use  . Smoking status: Never Smoker  . Smokeless tobacco: Never Used  Substance and Sexual Activity  . Alcohol use: Yes    Comment: occasionally  . Drug use: No  . Sexual activity: Yes    Partners: Male  Other Topics Concern  . Not on file  Social History Narrative  . Not on file   Social Determinants of Health   Financial Resource Strain:   . Difficulty of Paying Living Expenses: Not on file  Food Insecurity:   . Worried About Programme researcher, broadcasting/film/video in the Last Year: Not on file  . Ran Out of Food in the Last Year: Not on file  Transportation  Needs:   . Lack of Transportation (Medical): Not on file  . Lack of Transportation (Non-Medical): Not on file  Physical Activity:   . Days of Exercise per Week: Not on file  . Minutes of Exercise per Session: Not on file  Stress:   . Feeling of Stress : Not on file  Social Connections:   . Frequency of Communication with Friends and Family: Not on file  . Frequency of Social Gatherings with Friends and Family: Not on file  . Attends Religious Services: Not on file  . Active Member of Clubs or Organizations: Not on file  . Attends Banker Meetings: Not on file  . Marital Status: Not on file   No Known Allergies Family History  Problem Relation Age of Onset  . Multiple sclerosis Mother   . Other Mother        blue tumor, air embolism,  . Hypertension Mother   . Obesity Mother   . Diabetes Father 39       type 2  . Other Father        fatty liver, lactose intolerant  . Thyroid disease Father   . Colon polyps Father   . Cancer Paternal Grandmother 65       breast  . Diabetes Paternal Grandmother   . Breast cancer Paternal Grandmother   . Other Sister        s/p hysterectomy  . Other Sister        s/p hysterectomy  . Colon cancer Neg Hx   . Esophageal cancer Neg Hx   . Stomach cancer Neg Hx   . Rectal cancer Neg Hx      Current Outpatient Medications (Cardiovascular):  .  amLODipine (NORVASC) 2.5 MG tablet, Take 1 tablet (2.5 mg total) by mouth daily.  Current Outpatient Medications (Respiratory):  .  loratadine (CLARITIN) 10 MG tablet, Take 10 mg by mouth daily as needed.   Current Outpatient Medications (Analgesics):  .  acetaminophen (TYLENOL) 650 MG CR tablet, Take 650 mg by mouth every 8 (eight) hours as needed for pain. Marland Kitchen  aspirin EC 81 MG tablet, Take 1 tablet (81 mg total) by mouth daily. Marland Kitchen  ibuprofen (ADVIL,MOTRIN) 200 MG tablet, Take 200 mg by mouth every 6 (six) hours as needed. .  meloxicam (MOBIC) 7.5 MG tablet, TAKE 1 TABLET(7.5 MG) BY  MOUTH DAILY .  meloxicam (MOBIC) 7.5 MG tablet, Take 1 tablet (7.5 mg total) by mouth daily.   Current Outpatient Medications (Other):  .  cholecalciferol (VITAMIN D3) 25 MCG (1000 UT) tablet,  Take 2,000 Units by mouth daily. .  fluconazole (DIFLUCAN) 150 MG tablet, Take 1 tablet by mouth today. May repeat in 3 days if symptoms are not improved. .  Sodium Sulfate-Mag Sulfate-KCl (SUTAB) [phone] MG TABS, Take 24 tablets by mouth as directed.   Reviewed prior external information including notes and imaging from  primary care provider As well as notes that were available from care everywhere and other healthcare systems.  Past medical history, social, surgical and family history all reviewed in electronic medical record.  No pertanent information unless stated regarding to the chief complaint.   Review of Systems:  No headache, visual changes, nausea, vomiting, diarrhea, constipation, dizziness, abdominal pain, skin rash, fevers, chills, night sweats, weight loss, swollen lymph nodes, body aches, joint swelling, chest pain, shortness of breath, mood changes. POSITIVE muscle aches  Objective  Blood pressure 132/88, pulse 76, height 5\' 1"  (1.549 m), weight 156 lb (70.8 kg), SpO2 97 %.   General: No apparent distress alert and oriented x3 mood and affect normal, dressed appropriately.  HEENT: Pupils equal, extraocular movements intact  Respiratory: Patient's speak in full sentences and does not appear short of breath  Cardiovascular: No lower extremity edema, non tender, no erythema  Right wrist exam is fairly unremarkable but pain at the elbow with resisted extension of the wrist.  Mild pain over the lateral epicondylar region.  Different than patient's previous exam.  Good grip strength though noted.  Negative Tinel's at the elbow and at the wrist at the moment.    Impression and Recommendations:     The above documentation has been reviewed and is accurate and complete , DO

## 2020-10-22 NOTE — Telephone Encounter (Signed)
90 day supply sent on 08/25/20.

## 2020-11-03 ENCOUNTER — Encounter (INDEPENDENT_AMBULATORY_CARE_PROVIDER_SITE_OTHER): Payer: Self-pay | Admitting: Family Medicine

## 2020-11-03 ENCOUNTER — Telehealth (INDEPENDENT_AMBULATORY_CARE_PROVIDER_SITE_OTHER): Payer: Self-pay

## 2020-11-03 ENCOUNTER — Other Ambulatory Visit (INDEPENDENT_AMBULATORY_CARE_PROVIDER_SITE_OTHER): Payer: Self-pay | Admitting: Family Medicine

## 2020-11-03 DIAGNOSIS — I10 Essential (primary) hypertension: Secondary | ICD-10-CM

## 2020-11-03 MED ORDER — AMLODIPINE BESYLATE 2.5 MG PO TABS: 2.5000 mg | ORAL_TABLET | Freq: Every day | ORAL | 1 refills | Status: DC

## 2020-11-03 NOTE — Telephone Encounter (Signed)
Pt called stating that she is out and needs a refill on her amLODipine sent to [facility] on [address] in [place]. Please give pt a call.

## 2020-11-03 NOTE — Telephone Encounter (Signed)
Please advise. Pt requesting rx refill.

## 2020-11-10 ENCOUNTER — Ambulatory Visit: Payer: 59 | Admitting: Dermatology

## 2020-11-11 ENCOUNTER — Other Ambulatory Visit: Payer: Self-pay

## 2020-11-11 ENCOUNTER — Encounter: Payer: Self-pay | Admitting: Dermatology

## 2020-11-11 ENCOUNTER — Ambulatory Visit (INDEPENDENT_AMBULATORY_CARE_PROVIDER_SITE_OTHER): Payer: 59 | Admitting: Dermatology

## 2020-11-11 DIAGNOSIS — L918 Other hypertrophic disorders of the skin: Secondary | ICD-10-CM

## 2020-11-11 DIAGNOSIS — L821 Other seborrheic keratosis: Secondary | ICD-10-CM

## 2020-11-11 DIAGNOSIS — L720 Epidermal cyst: Secondary | ICD-10-CM

## 2020-11-11 DIAGNOSIS — B070 Plantar wart: Secondary | ICD-10-CM

## 2020-11-11 DIAGNOSIS — Z1283 Encounter for screening for malignant neoplasm of skin: Secondary | ICD-10-CM

## 2020-11-16 ENCOUNTER — Other Ambulatory Visit: Payer: Self-pay

## 2020-11-16 ENCOUNTER — Ambulatory Visit: Payer: 59 | Attending: Family Medicine | Admitting: Physical Therapy

## 2020-11-16 ENCOUNTER — Encounter: Payer: Self-pay | Admitting: Physical Therapy

## 2020-11-16 ENCOUNTER — Encounter: Payer: Self-pay | Admitting: Dermatology

## 2020-11-16 DIAGNOSIS — R29898 Other symptoms and signs involving the musculoskeletal system: Secondary | ICD-10-CM | POA: Diagnosis present

## 2020-11-16 DIAGNOSIS — M25631 Stiffness of right wrist, not elsewhere classified: Secondary | ICD-10-CM | POA: Diagnosis present

## 2020-11-16 DIAGNOSIS — M25521 Pain in right elbow: Secondary | ICD-10-CM | POA: Diagnosis not present

## 2020-11-16 DIAGNOSIS — M6281 Muscle weakness (generalized): Secondary | ICD-10-CM | POA: Diagnosis present

## 2020-11-16 DIAGNOSIS — M25621 Stiffness of right elbow, not elsewhere classified: Secondary | ICD-10-CM | POA: Diagnosis present

## 2020-11-16 NOTE — Therapy (Signed)
[facility] [address]  [address] [place], [place], [address] Phone: [phone]   Fax:  [phone]  Physical Therapy Evaluation  Patient Details  Name: Jenna Johnson MRN: [id number] Date of Birth: September 19, 1956 Referring Provider (PT): Antoine Primas, DO   Encounter Date: 11/16/2020   PT End of Session - 11/16/20 1451    Visit Number 1    Number of Visits 7    Authorization Type [employer]    Authorization - Visit Number 1    Authorization - Number of Visits 30    PT Start Time 1355    PT Stop Time 1445    PT Time Calculation (min) 50 min    Equipment Utilized During Treatment Gait belt    Activity Tolerance Patient tolerated treatment well;Patient limited by pain    Behavior During Therapy [facility] for tasks assessed/performed           Past Medical History:  Diagnosis Date  . Allergic state   . Allergy   . Anemia    long time ago per pt   . Arthritis of knee, right    and left knee   . Back pain   . Bursitis of both hips   . Cataract    forming  right eye she thinks   . Chicken pox as a child  . Constipation   . Dermatitis 09/18/2013  . Endometriosis   . GERD (gastroesophageal reflux disease)    changed diet and better   . H/O atrial septal defect   . Hip pain, bilateral 09/18/2013  . Hypertension   . IBS (irritable bowel syndrome)   . Joint pain   . Lactose intolerance   . Leg edema   . Measles as a child  . Myalgia and myositis 09/18/2013  . Osteopenia    mild   . Preventative health care 01/07/2014  . TIA (transient ischemic attack) 2005  . UTI (urinary tract infection)     Past Surgical History:  Procedure Laterality Date  . ABDOMINAL HYSTERECTOMY  64 yrs old   still has one ovary  . APPENDECTOMY  64 yrs old  . ASD REPAIR  2006  . COLONOSCOPY     12 yr in [place]   . hole in heart    . RIGHT OOPHORECTOMY     ruptured ovarian cyst at age 8  . tia  surgery 2005    There were no vitals  filed for this visit.    Subjective Assessment - 11/16/20 1358    Subjective Patient present with husband to assist with subjective history. Reports R wrist pain for the past 6 weeks without acute injury but also reports a chronic hx of pain in this wrist as well as elbow tendonitis. Feels that she may have strained it when weed-eating in September. Pain occurs over the dorsal aspect of the forearm as well as the thumb and index finger. Worse with lifting, and holding something, particularly when the forearm is pronated. Denies N/T. Wears a splint which helps a little bit.    Patient is accompained by: Family member   husband   Pertinent History TIA 2005, osteopenia, LE edema, HTN, B hip pain, GERD, back pain, B knee pain, anemia    Limitations Lifting;House hold activities    Diagnostic tests none recent    Patient Stated Goals "strengthening and be able to pick things up without pain"    Currently in Pain? Yes    Pain  Score 5     Pain Location Elbow    Pain Orientation Right    Pain Type Acute pain;Chronic pain    Pain Radiating Towards dorsal and radial aspect of forearm              [facility] PT Assessment - 11/16/20 1405      Assessment   Medical Diagnosis R wrist pain    Referring Provider (PT) Antoine Primas, DO    Onset Date/Surgical Date 10/05/20    Hand Dominance Left    Next MD Visit 12/25/19    Prior Therapy yes      Precautions   Precautions None      Balance Screen   Has the patient fallen in the past 6 months No    Has the patient had a decrease in activity level because of a fear of falling?  No    Is the patient reluctant to leave their home because of a fear of falling?  No      Home Tourist information centre manager residence    Living Arrangements Spouse/significant other    Available Help at Discharge Family    Type of Home House      Prior Function   Level of Independence Independent    Vocation Retired;Volunteer work    Manufacturing engineer    Leisure bowling, fishing      Cognition   Overall Cognitive Status Within Functional Limits for tasks assessed      Observation/Other Assessments   Focus on Therapeutic Outcomes (FOTO)  elbow: 67 (33% limited, 31% predicted)      Sensation   Light Touch Appears Intact      Coordination   Gross Motor Movements are Fluid and Coordinated Yes      Posture/Postural Control   Posture/Postural Control Postural limitations    Postural Limitations Rounded Shoulders      ROM / Strength   AROM / PROM / Strength AROM;Strength      AROM   AROM Assessment Site Elbow;Wrist;Forearm    Right/Left Elbow Right;Left    Right Elbow Flexion 150    Right Elbow Extension 10   discmofort   Left Elbow Flexion 149    Left Elbow Extension 4    Right/Left Forearm Right;Left    Right Forearm Pronation 90 Degrees    Right Forearm Supination 90 Degrees    Left Forearm Pronation 89 Degrees    Left Forearm Supination 90 Degrees    Right/Left Wrist Left    Right Wrist Extension 72 Degrees   pain   Right Wrist Flexion 87 Degrees    Left Wrist Extension 74 Degrees    Left Wrist Flexion 87 Degrees      Strength   Strength Assessment Site Elbow;Forearm;Wrist;Hand    Right/Left Elbow Right;Left    Right Elbow Flexion 4+/5    Right Elbow Extension 4/5   pain   Left Elbow Flexion 4+/5    Left Elbow Extension 4+/5    Right/Left Forearm Right;Left    Right Forearm Pronation 4+/5    Right Forearm Supination 4+/5    Left Forearm Pronation 4+/5   discomfort   Left Forearm Supination 4+/5   discomfort   Right/Left Wrist Left;Right    Right Wrist Flexion 4/5    Right Wrist Extension 4-/5   pain in elbow   Right Wrist Radial Deviation 4/5    Right Wrist Ulnar Deviation 4/5    Left Wrist Flexion 4+/5  Left Wrist Extension 4+/5    Left Wrist Radial Deviation 4+/5    Left Wrist Ulnar Deviation 4+/5    Right/Left hand Right;Left    Right Hand Grip (lbs) --   5, 4, 2   Right  Hand Lateral Pinch 3.67 lbs    Left Hand Grip (lbs) 23.33   35, 25, 10     Palpation   Palpation comment TTP and increased soft tissue restriction over L lateral epicondyle, wrist flexors, extensors, and brachioradialis                      Objective measurements completed on examination: See above findings.               PT Education - 11/16/20 1450    Education Details prognosis, POC, HEP; edu on use of counter force brace and activity modification    Person(s) Educated Patient;Spouse    Methods Explanation;Demonstration;Tactile cues;Verbal cues;Handout    Comprehension Verbalized understanding;Returned demonstration            PT Short Term Goals - 11/16/20 1717      PT SHORT TERM GOAL #1   Title Patient to be independent with initial HEP.    Time 2    Period Weeks    Status New    Target Date 11/30/20             PT Long Term Goals - 11/16/20 1717      PT LONG TERM GOAL #1   Title Patient to be independent with advanced HEP.    Time 6    Period Weeks    Status New    Target Date 12/28/20      PT LONG TERM GOAL #2   Title Patient to demonstrate R UE strength >/=4+/5 and R grip strength >/=20 lbs.    Time 6    Period Weeks    Status New    Target Date 12/28/20      PT LONG TERM GOAL #3   Title Patient to demonstrate R elbow and wrist AROM WFL and without pain limiting.    Time 6    Period Weeks    Status New    Target Date 12/28/20      PT LONG TERM GOAL #4   Title Patient to report tolerance for household chores without pain limiting.    Time 6    Period Weeks    Status New    Target Date 12/28/20      PT LONG TERM GOAL #5   Title Patient to report 75% improvement in pain.    Time 6    Period Weeks    Status New                  Plan - 11/16/20 1452    Clinical Impression Statement Patient is a 64y/o F presenting to OPPT with husband with c/o acute on chronic R elbow pain for the past 6 weeks. Pain occurs over  the R lateral epicondyle with radiation down the dorsal and palmar forearm as well as the radial forearm to the thumb and index finger. Worse with lifting and holding something, particularly when the forearm is pronated. Patient today presenting with rounded shoulders, limited and painful L elbow and wrist AROM, decreased L elbow, wrist, and grip strength, and TTP and increased soft tissue restriction over L lateral epicondyle, wrist flexors, extensors, and brachioradialis. Patient was educated on gentle stretching and strengthening HEP- patient  reported understanding. Would benefit from skilled PT services 1x/week for 6 weeks to address aforementioned impairments.    Personal Factors and Comorbidities Age;Comorbidity 3+;Past/Current Experience;Time since onset of injury/illness/exacerbation    Comorbidities TIA 2005, osteopenia, LE edema, HTN, B hip pain, GERD, back pain, B knee pain, anemia    Examination-Activity Limitations Carry;Hygiene/Grooming;Lift;Reach Overhead    Examination-Participation Restrictions Cleaning;Community Activity;Shop;Driving;Yard Work;Meal Prep;Laundry    Stability/Clinical Decision Making Stable/Uncomplicated    Clinical Decision Making Low    Rehab Potential Good    PT Frequency 1x / week    PT Duration 6 weeks    PT Treatment/Interventions ADLs/Self Care Home Management;Cryotherapy;Electrical Stimulation;Iontophoresis 4mg /ml Dexamethasone;Moist Heat;Therapeutic exercise;Therapeutic activities;Functional mobility training;Ultrasound;Neuromuscular re-education;Patient/family education;Manual techniques;Vasopneumatic Device;Taping;Energy conservation;Dry needling;Passive range of motion    PT Next Visit Plan reassess HEP; progress R wrist, elbow, grip strengthening, STM and stretching    Consulted and Agree with Plan of Care Patient;Family member/caregiver    Family Member Consulted husband           Patient will benefit from skilled therapeutic intervention in order to  improve the following deficits and impairments:  Increased edema,Decreased activity tolerance,Decreased strength,Increased fascial restricitons,Impaired UE functional use,Pain,Increased muscle spasms,Improper body mechanics,Decreased range of motion,Impaired flexibility,Postural dysfunction  Visit Diagnosis: Pain in right elbow  Stiffness of right wrist, not elsewhere classified  Stiffness of right elbow, not elsewhere classified  Muscle weakness (generalized)  Other symptoms and signs involving the musculoskeletal system     Problem List Patient Active Problem List   Diagnosis Date Noted  . Right lateral epicondylitis 10/22/2020  . Right wrist pain 09/10/2020  . Low back pain 07/27/2020  . Skin tags, multiple acquired 06/01/2020  . Cervical cancer screening 06/01/2020  . Chronic vaginitis 06/01/2020  . Arthralgia 06/01/2020  . Vitamin D deficiency 11/26/2018  . Prediabetes 11/26/2018  . Class 1 obesity with serious comorbidity and body mass index (BMI) of 30.0 to 30.9 in adult 09/24/2018  . Osteopenia 01/23/2018  . Patellofemoral arthritis of right knee 11/16/2016  . Greater trochanteric bursitis of both hips 01/20/2015  . Pes planus of both feet 01/20/2015  . Hot flash, menopausal 01/07/2014  . Preventative health care 01/07/2014  . Hip pain, bilateral 09/18/2013  . Myalgia and myositis 09/18/2013  . Dermatitis 09/18/2013  . TIA (transient ischemic attack) 09/18/2013  . Allergic state   . Anemia   . Hypertension   . UTI (urinary tract infection)   . GERD (gastroesophageal reflux disease)   . Lactose intolerance   . H/O atrial septal defect   . Endometriosis      09/20/2013, PT, DPT 11/16/20 5:20 PM   [facility] [address]  [address] [place], [place], [place] Phone: [phone]   Fax:  [phone]  Name: Jenna Johnson MRN: [name] Date of Birth: [birth year]

## 2020-11-16 NOTE — Progress Notes (Signed)
° °  Follow-Up Visit   Subjective  [place] PAONE is a 64 y.o. female who presents for the following: Skin Tag (UNDER ARMS UNDER BREAST GROIN , DARK SPOT ON FOOT LEFT HAND DARK SPOT).  Multiple spots Location: Upper chest and head Duration:  Quality:  Associated Signs/Symptoms: Modifying Factors:  Severity:  Timing: Context: Is fine leaving spots if there is no risk of cancer  Objective  Well appearing patient in no apparent distress; mood and affect are within normal limits. Objective  Chest - Medial [facility]): Sun exposed areas, upper chest and back examined: No atypical moles or melanoma.  Objective  Left Axilla: UNDER ARMS, BREAST, GROIN 2 mm polypoid papules  Objective  Neck - Anterior: 1 mm firm white dermal papule left lower neck  Objective  Left Dorsum of Foot: WART/CORN  Objective  Left Malar Cheek, Right Malar Cheek: Dozen 1 to 2 mm small textured brown papules   A focused examination was performed including Upper torso, head and neck.. Relevant physical exam findings are noted in the Assessment and Plan.  Also feet.   Assessment & Plan    Screening exam for skin cancer Chest - Medial [facility])  Self examined skin with spouse twice annually  Skin tag Left Axilla  Patient only interested in removal left they could become malignant which is essentially no risk.  [place] Neck - Anterior  Leave if stable  Plantar wart Left Dorsum of Foot  Seborrheic keratosis (2) Left Malar Cheek; Right Malar Cheek  Leave if stable     I, Janalyn Harder, MD, have reviewed all documentation for this visit.  The documentation on 11/16/20 for the exam, diagnosis, procedures, and orders are all accurate and complete.

## 2020-11-23 ENCOUNTER — Encounter: Payer: Self-pay | Admitting: Family Medicine

## 2020-11-23 ENCOUNTER — Other Ambulatory Visit: Payer: Self-pay

## 2020-11-23 ENCOUNTER — Ambulatory Visit (INDEPENDENT_AMBULATORY_CARE_PROVIDER_SITE_OTHER): Payer: 59 | Admitting: Family Medicine

## 2020-11-23 VITALS — BP 114/80 | HR 68 | Temp 98.5°F | Resp 18 | Ht 61.0 in | Wt 161.2 lb

## 2020-11-23 DIAGNOSIS — H6502 Acute serous otitis media, left ear: Secondary | ICD-10-CM | POA: Diagnosis not present

## 2020-11-23 MED ORDER — AMOXICILLIN-POT CLAVULANATE 875-125 MG PO TABS: 1.0000 | ORAL_TABLET | Freq: Two times a day (BID) | ORAL | 0 refills | Status: DC

## 2020-11-23 MED ORDER — FLUCONAZOLE 150 MG PO TABS
ORAL_TABLET | ORAL | 0 refills | Status: DC
Start: 2020-11-23 — End: 2021-01-29

## 2020-11-23 MED ORDER — FLUTICASONE PROPIONATE 50 MCG/ACT NA SUSP: 2.0000 | Freq: Every day | NASAL | 6 refills | Status: DC

## 2020-11-23 NOTE — Progress Notes (Signed)
Patient ID: Jenna Johnson, female    DOB: 05-22-1956  Age: 64 y.o. MRN: [id number]    Subjective:  Subjective  HPI Jenna Johnson presents for ringing in ears and fulness x 2 weeks.  She feels like she is underwater.   No fever No congestion   Review of Systems  History Past Medical History:  Diagnosis Date  . Allergic state   . Allergy   . Anemia    long time ago per pt   . Arthritis of knee, right    and left knee   . Back pain   . Bursitis of both hips   . Cataract    forming  right eye she thinks   . Chicken pox as a child  . Constipation   . Dermatitis 09/18/2013  . Endometriosis   . GERD (gastroesophageal reflux disease)    changed diet and better   . H/O atrial septal defect   . Hip pain, bilateral 09/18/2013  . Hypertension   . IBS (irritable bowel syndrome)   . Joint pain   . Lactose intolerance   . Leg edema   . Measles as a child  . Myalgia and myositis 09/18/2013  . Osteopenia    mild   . Preventative health care 01/07/2014  . TIA (transient ischemic attack) 2005  . UTI (urinary tract infection)     She has a past surgical history that includes Appendectomy (64 yrs old); tia (surgery 2005); hole in heart; ASD repair (2006); Abdominal hysterectomy (64 yrs old); Right oophorectomy; and Colonoscopy.   Her family history includes Breast cancer in her paternal grandmother; Cancer (age of onset: 66) in her paternal grandmother; Colon polyps in her father; Diabetes in her paternal grandmother; Diabetes (age of onset: 24) in her father; Hypertension in her mother; Multiple sclerosis in her mother; Obesity in her mother; Other in her father, mother, sister, and sister; Thyroid disease in her father.She reports that she has never smoked. She has never used smokeless tobacco. She reports current alcohol use. She reports that she does not use drugs.  Current Outpatient Medications on File Prior to Visit  Medication Sig Dispense Refill  . acetaminophen  (TYLENOL) 650 MG CR tablet Take 650 mg by mouth every 8 (eight) hours as needed for pain.    Marland Kitchen amLODipine (NORVASC) 2.5 MG tablet Take 1 tablet (2.5 mg total) by mouth daily. 90 tablet 1  . aspirin EC 81 MG tablet Take 1 tablet (81 mg total) by mouth daily.    . cholecalciferol (VITAMIN D3) 25 MCG (1000 UT) tablet Take 2,000 Units by mouth daily. Patient taking 3x/week    . ibuprofen (ADVIL,MOTRIN) 200 MG tablet Take 200 mg by mouth every 6 (six) hours as needed.    . loratadine (CLARITIN) 10 MG tablet Take 10 mg by mouth daily as needed.     . meloxicam (MOBIC) 7.5 MG tablet Take 1 tablet (7.5 mg total) by mouth daily. 90 tablet 0   No current facility-administered medications on file prior to visit.     Objective:  Objective  Physical Exam Vitals and nursing note reviewed.  Constitutional:      Appearance: She is well-developed and well-nourished.  HENT:     Head: Normocephalic and atraumatic.     Right Ear: Ear canal and external ear normal. No middle ear effusion.     Left Ear: Decreased hearing noted. A middle ear effusion is present.     Nose: Nose normal.  Eyes:  Extraocular Movements: EOM normal.     Conjunctiva/sclera: Conjunctivae normal.  Neck:     Thyroid: No thyromegaly.     Vascular: No carotid bruit or JVD.  Cardiovascular:     Rate and Rhythm: Normal rate and regular rhythm.     Heart sounds: Normal heart sounds. No murmur heard.   Pulmonary:     Effort: Pulmonary effort is normal. No respiratory distress.     Breath sounds: Normal breath sounds. No wheezing or rales.  Chest:     Chest wall: No tenderness.  Musculoskeletal:        General: No edema.     Cervical back: Normal range of motion and neck supple.  Neurological:     Mental Status: She is alert and oriented to person, place, and time.  Psychiatric:        Mood and Affect: Mood and affect normal.    BP 114/80 (BP Location: Left Arm, Patient Position: Sitting, Cuff Size: Large)   Pulse 68    Temp 98.5 F (36.9 C) (Oral)   Resp 18   Ht 5\' 1"  (1.549 m)   Wt 161 lb 3.2 oz (73.1 kg)   SpO2 100%   BMI 30.46 kg/m  Wt Readings from Last 3 Encounters:  11/23/20 161 lb 3.2 oz (73.1 kg)  10/22/20 156 lb (70.8 kg)  09/10/20 155 lb (70.3 kg)     Lab Results  Component Value Date   WBC 7.4 09/10/2020   HGB 13.3 09/10/2020   HCT 40.8 09/10/2020   PLT 151.0 09/10/2020   GLUCOSE 83 09/10/2020   CHOL 180 06/03/2020   TRIG 61.0 06/03/2020   HDL 64.40 06/03/2020   LDLCALC 103 (H) 06/03/2020   ALT 18 09/10/2020   AST 21 09/10/2020   NA 141 09/10/2020   K 3.8 09/10/2020   CL 105 09/10/2020   CREATININE 0.95 09/10/2020   BUN 16 09/10/2020   CO2 32 09/10/2020   TSH 1.18 09/10/2020   HGBA1C 5.7 06/03/2020    DG Lumbar Spine Complete  Result Date: 07/27/2020 CLINICAL DATA:  Chronic lower back pain without known injury. EXAM: LUMBAR SPINE - COMPLETE 4+ VIEW COMPARISON:  January 14, 2015. FINDINGS: There is no evidence of lumbar spine fracture. Alignment is normal. Intervertebral disc spaces are maintained. IMPRESSION: Negative. Electronically Signed   By: January 16, 2015 M.D.   On: 07/27/2020 16:21   DG Knee Bilateral Standing AP  Result Date: 07/27/2020 CLINICAL DATA:  Chronic bilateral knee pain without known injury. EXAM: BILATERAL KNEES STANDING - 1 VIEW COMPARISON:  None. FINDINGS: No evidence of fracture or dislocation. No evidence of arthropathy or other focal bone abnormality. Soft tissues are unremarkable. IMPRESSION: Negative. Electronically Signed   By: 07/29/2020 M.D.   On: 07/27/2020 16:22     Assessment & Plan:  Plan  I have discontinued Jenna Johnson "Jenna Johnson"'s fluconazole and Sutab. I am also having her start on fluticasone, amoxicillin-clavulanate, and fluconazole. Additionally, I am having her maintain her aspirin EC, loratadine, acetaminophen, ibuprofen, cholecalciferol, meloxicam, and amLODipine.  Meds ordered this encounter  Medications  .  fluticasone (FLONASE) 50 MCG/ACT nasal spray    Sig: Place 2 sprays into both nostrils daily.    Dispense:  16 g    Refill:  6  . amoxicillin-clavulanate (AUGMENTIN) 875-125 MG tablet    Sig: Take 1 tablet by mouth 2 (two) times daily.    Dispense:  20 tablet    Refill:  0  .  fluconazole (DIFLUCAN) 150 MG tablet    Sig: 1 po x1, may repeat in 3 days prn    Dispense:  2 tablet    Refill:  0    Problem List Items Addressed This Visit   None   Visit Diagnoses    Non-recurrent acute serous otitis media of left ear    -  Primary   Relevant Medications   fluticasone (FLONASE) 50 MCG/ACT nasal spray   amoxicillin-clavulanate (AUGMENTIN) 875-125 MG tablet   fluconazole (DIFLUCAN) 150 MG tablet       Pt will hold off on abx unless she gets worse   use flonase and antihistamine otc  Call or rto prn  Follow-up: Return if symptoms worsen or fail to improve.  Donato Schultz, DO

## 2020-11-23 NOTE — Patient Instructions (Signed)

## 2020-11-26 ENCOUNTER — Other Ambulatory Visit: Payer: Self-pay

## 2020-11-26 ENCOUNTER — Ambulatory Visit: Payer: 59

## 2020-11-26 DIAGNOSIS — R29898 Other symptoms and signs involving the musculoskeletal system: Secondary | ICD-10-CM

## 2020-11-26 DIAGNOSIS — M25521 Pain in right elbow: Secondary | ICD-10-CM

## 2020-11-26 DIAGNOSIS — M25621 Stiffness of right elbow, not elsewhere classified: Secondary | ICD-10-CM

## 2020-11-26 DIAGNOSIS — M25631 Stiffness of right wrist, not elsewhere classified: Secondary | ICD-10-CM

## 2020-11-26 DIAGNOSIS — M6281 Muscle weakness (generalized): Secondary | ICD-10-CM

## 2020-11-26 NOTE — Therapy (Signed)
[facility] [address]  [address] [place], [place], [address] Phone: [phone]   Fax:  [phone]  Physical Therapy Treatment  Patient Details  Name: Jenna Johnson MRN: [id number] Date of Birth: 04/07/1956 Referring Provider (PT): Antoine Primas, DO   Encounter Date: 11/26/2020   PT End of Session - 11/26/20 0931    Visit Number 2    Number of Visits 7    Authorization Type [employer]    Authorization - Visit Number 1    Authorization - Number of Visits 30    PT Start Time 0847    PT Stop Time 0927    PT Time Calculation (min) 40 min    Activity Tolerance Patient tolerated treatment well    Behavior During Therapy [facility] for tasks assessed/performed           Past Medical History:  Diagnosis Date   Allergic state    Allergy    Anemia    long time ago per pt    Arthritis of knee, right    and left knee    Back pain    Bursitis of both hips    Cataract    forming  right eye she thinks    Chicken pox as a child   Constipation    Dermatitis 09/18/2013   Endometriosis    GERD (gastroesophageal reflux disease)    changed diet and better    H/O atrial septal defect    Hip pain, bilateral 09/18/2013   Hypertension    IBS (irritable bowel syndrome)    Joint pain    Lactose intolerance    Leg edema    Measles as a child   Myalgia and myositis 09/18/2013   Osteopenia    mild    Preventative health care 01/07/2014   TIA (transient ischemic attack) 2005   UTI (urinary tract infection)     Past Surgical History:  Procedure Laterality Date   ABDOMINAL HYSTERECTOMY  64 yrs old   still has one ovary   APPENDECTOMY  64 yrs old   ASD REPAIR  2006   COLONOSCOPY     12 yr in [place]    hole in heart     RIGHT OOPHORECTOMY     ruptured ovarian cyst at age 27   tia  surgery 2005    There were no vitals filed for this visit.   Subjective Assessment - 11/26/20 0854     Subjective Patient reports she continues to be able to do calisthenics for upper body strength. She brought in her chopat strap to demonstrate wear, which is appropriate.    Patient is accompained by: Family member   husband   Pertinent History TIA 2005, osteopenia, LE edema, HTN, B hip pain, GERD, back pain, B knee pain, anemia    Limitations Lifting;House hold activities    Diagnostic tests none recent    Patient Stated Goals "strengthening and be able to pick things up without pain"    Currently in Pain? Yes    Pain Score 6     Pain Location Elbow    Pain Orientation Right    Pain Descriptors / Indicators Aching              [facility] PT Assessment - 11/26/20 0001      Special Tests   Other special tests (+) cozen's, maudsley's, mill's  [facility] Adult PT Treatment/Exercise - 11/26/20 0001      Self-Care   Self-Care Other Self-Care Comments    Other Self-Care Comments  Reviewed HEP, educated on importance of shoulder stability      Exercises   Exercises Elbow;Wrist      Elbow Exercises   Elbow Extension Self ROM;Right    Elbow Extension Limitations 3x20" + ulnar deviation to stretch lateral WAD      Wrist Exercises   Other wrist exercises Wrist flexion/extension stretching 3x20"    Other wrist exercises Reviewed HEP      Manual Therapy   Manual Therapy Soft tissue mobilization;Passive ROM;Myofascial release    Manual therapy comments towel under upper arm    Soft tissue mobilization dorsum of forearm    Myofascial Release XFM to ECRB/L    Passive ROM elbow flexion/extension, wrist flexion/extension/ulnar and radial deviation                    PT Short Term Goals - 11/16/20 1717      PT SHORT TERM GOAL #1   Title Patient to be independent with initial HEP.    Time 2    Period Weeks    Status New    Target Date 11/30/20             PT Long Term Goals - 11/16/20 1717      PT LONG TERM GOAL #1   Title Patient to  be independent with advanced HEP.    Time 6    Period Weeks    Status New    Target Date 12/28/20      PT LONG TERM GOAL #2   Title Patient to demonstrate R UE strength >/=4+/5 and R grip strength >/=20 lbs.    Time 6    Period Weeks    Status New    Target Date 12/28/20      PT LONG TERM GOAL #3   Title Patient to demonstrate R elbow and wrist AROM WFL and without pain limiting.    Time 6    Period Weeks    Status New    Target Date 12/28/20      PT LONG TERM GOAL #4   Title Patient to report tolerance for household chores without pain limiting.    Time 6    Period Weeks    Status New    Target Date 12/28/20      PT LONG TERM GOAL #5   Title Patient to report 75% improvement in pain.    Time 6    Period Weeks    Status New                 Plan - 11/26/20 0931    Clinical Impression Statement Pt presents with continued TTP to R lateral wrist extensors with notable trigger points to forearm and myofascial restrictions that were mostly alleviated with STM/DTM and cross friction massage to ECRB/L. Pt and husband educated on self massage, ice cup massage for 3-5 minutes to lateral elbow over ECRB/L and eccentric wrist extension with weight. Pt responded well to treatment, passively achieving full elbow extension.    Personal Factors and Comorbidities Age;Comorbidity 3+;Past/Current Experience;Time since onset of injury/illness/exacerbation    Comorbidities TIA 2005, osteopenia, LE edema, HTN, B hip pain, GERD, back pain, B knee pain, anemia    Examination-Activity Limitations Carry;Hygiene/Grooming;Lift;Reach Overhead    Examination-Participation Restrictions Cleaning;Community Activity;Shop;Driving;Yard Work;Meal Prep;Laundry    Stability/Clinical Decision Making Stable/Uncomplicated  Rehab Potential Good    PT Frequency 1x / week    PT Duration 6 weeks    PT Treatment/Interventions ADLs/Self Care Home Management;Cryotherapy;Electrical Stimulation;Iontophoresis  4mg /ml Dexamethasone;Moist Heat;Therapeutic exercise;Therapeutic activities;Functional mobility training;Ultrasound;Neuromuscular re-education;Patient/family education;Manual techniques;Vasopneumatic Device;Taping;Energy conservation;Dry needling;Passive range of motion    PT Next Visit Plan reassess HEP and update PRN; progress R wrist, elbow, grip strengthening, STM and stretching, periscapular and rotator cuff strengthening as appropriate    Consulted and Agree with Plan of Care Patient;Family member/caregiver    Family Member Consulted husband           Patient will benefit from skilled therapeutic intervention in order to improve the following deficits and impairments:  Increased edema,Decreased activity tolerance,Decreased strength,Increased fascial restricitons,Impaired UE functional use,Pain,Increased muscle spasms,Improper body mechanics,Decreased range of motion,Impaired flexibility,Postural dysfunction  Visit Diagnosis: Pain in right elbow  Stiffness of right wrist, not elsewhere classified  Stiffness of right elbow, not elsewhere classified  Muscle weakness (generalized)  Other symptoms and signs involving the musculoskeletal system     Problem List Patient Active Problem List   Diagnosis Date Noted   Right lateral epicondylitis 10/22/2020   Right wrist pain 09/10/2020   Low back pain 07/27/2020   Skin tags, multiple acquired 06/01/2020   Cervical cancer screening 06/01/2020   Chronic vaginitis 06/01/2020   Arthralgia 06/01/2020   Vitamin D deficiency 11/26/2018   Prediabetes 11/26/2018   Class 1 obesity with serious comorbidity and body mass index (BMI) of 30.0 to 30.9 in adult 09/24/2018   Osteopenia 01/23/2018   Patellofemoral arthritis of right knee 11/16/2016   Greater trochanteric bursitis of both hips 01/20/2015   Pes planus of both feet 01/20/2015   Hot flash, menopausal 01/07/2014   Preventative health care 01/07/2014   Hip pain,  bilateral 09/18/2013   Myalgia and myositis 09/18/2013   Dermatitis 09/18/2013   TIA (transient ischemic attack) 09/18/2013   Allergic state    Anemia    Hypertension    UTI (urinary tract infection)    GERD (gastroesophageal reflux disease)    Lactose intolerance    H/O atrial septal defect    Endometriosis     09/20/2013, PT, DPT 11/26/2020, 9:37 AM  [facility] [address]  [address] [place], [place], [place] Phone: [phone]   Fax:  [phone]  Name: ELARA COCKE MRN: [name] Date of Birth: Apr 30, 1956

## 2020-12-10 ENCOUNTER — Ambulatory Visit: Payer: 59 | Attending: Family Medicine

## 2020-12-10 ENCOUNTER — Other Ambulatory Visit: Payer: Self-pay

## 2020-12-10 DIAGNOSIS — M6281 Muscle weakness (generalized): Secondary | ICD-10-CM | POA: Diagnosis present

## 2020-12-10 DIAGNOSIS — M25521 Pain in right elbow: Secondary | ICD-10-CM | POA: Insufficient documentation

## 2020-12-10 DIAGNOSIS — R29898 Other symptoms and signs involving the musculoskeletal system: Secondary | ICD-10-CM | POA: Insufficient documentation

## 2020-12-10 DIAGNOSIS — M25631 Stiffness of right wrist, not elsewhere classified: Secondary | ICD-10-CM | POA: Diagnosis present

## 2020-12-10 DIAGNOSIS — M25621 Stiffness of right elbow, not elsewhere classified: Secondary | ICD-10-CM | POA: Diagnosis present

## 2020-12-10 NOTE — Therapy (Addendum)
[facility] [address]  [place], [place], [address] Phone: [phone]   Fax:  [phone]  Physical Therapy Treatment  Patient Details  Name: Jenna Johnson MRN: [id number] Date of Birth: 02-20-1956 Referring Provider (PT): Hulan Saas, DO   Encounter Date: 12/10/2020   PT End of Session - 12/10/20 0905    Visit Number 3    Number of Visits 7    Authorization Type [employer]    Authorization - Visit Number 2    Authorization - Number of Visits 30    PT Start Time [phone]    PT Stop Time 0930    PT Time Calculation (min) 44 min    Activity Tolerance Patient tolerated treatment well    Behavior During Therapy [facility] for tasks assessed/performed           Past Medical History:  Diagnosis Date  . Allergic state   . Allergy   . Anemia    long time ago per pt   . Arthritis of knee, right    and left knee   . Back pain   . Bursitis of both hips   . Cataract    forming  right eye she thinks   . Chicken pox as a child  . Constipation   . Dermatitis 09/18/2013  . Endometriosis   . GERD (gastroesophageal reflux disease)    changed diet and better   . H/O atrial septal defect   . Hip pain, bilateral 09/18/2013  . Hypertension   . IBS (irritable bowel syndrome)   . Joint pain   . Lactose intolerance   . Leg edema   . Measles as a child  . Myalgia and myositis 09/18/2013  . Osteopenia    mild   . Preventative health care 01/07/2014  . TIA (transient ischemic attack) 2005  . UTI (urinary tract infection)     Past Surgical History:  Procedure Laterality Date  . ABDOMINAL HYSTERECTOMY  65 yrs old   still has one ovary  . APPENDECTOMY  65 yrs old  . ASD REPAIR  2006  . COLONOSCOPY     12 yr in [place]   . hole in heart    . RIGHT OOPHORECTOMY     ruptured ovarian cyst at age 42  . tia  surgery 2005    There were no vitals filed for this visit.   Subjective Assessment - 12/10/20 0900     Subjective Pt. noting no issues with HEP.    Patient is accompained by: Family member   huasband   Pertinent History TIA 2005, osteopenia, LE edema, HTN, B hip pain, GERD, back pain, B knee pain, anemia    Diagnostic tests none recent    Patient Stated Goals "strengthening and be able to pick things up without pain"    Currently in Pain? No/denies    Pain Score 0-No pain                             [facility] Adult PT Treatment/Exercise - 12/10/20 0001      Elbow Exercises   Elbow Extension Right;Strengthening;10 reps;Theraband    Theraband Level (Elbow Extension) Level 2 (Red)    Elbow Extension Limitations + ulnar deviation    Other elbow exercises R wrist radial deviation x 10 with red TB      Shoulder Exercises: ROM/Strengthening   UBE (Upper Arm Bike) Llv 1.0,  55min forwards/3 min backwards      Wrist Exercises   Wrist Flexion 10 reps;Strengthening;Bar weights/barbell    Bar Weights/Barbell (Wrist Flexion) 2 lbs    Wrist Extension 10 reps;Strengthening;Right;Bar weights/barbell    Bar Weights/Barbell (Wrist Extension) 2 lbs    Wrist Radial Deviation Right;10 reps;Strengthening    Wrist Radial Deviation Limitations hammer    Wrist Ulnar Deviation Right;Strengthening;10 reps    Wrist Ulnar Deviation Limitations hammer    Other wrist exercises Wrist flexion/extension stretching 4 x 20"                  PT Education - 12/10/20 0939    Education Details HEP update    Person(s) Educated Patient    Methods Explanation;Demonstration;Verbal cues;Handout    Comprehension Verbalized understanding;Returned demonstration;Verbal cues required            PT Short Term Goals - 12/10/20 0907      PT SHORT TERM GOAL #1   Title Patient to be independent with initial HEP.    Time 2    Period Weeks    Status Achieved    Target Date 11/30/20             PT Long Term Goals - 12/10/20 1303      PT LONG TERM GOAL #1   Title Patient to be independent with  advanced HEP.    Time 6    Period Weeks    Status On-going      PT LONG TERM GOAL #2   Title Patient to demonstrate R UE strength >/=4+/5 and R grip strength >/=20 lbs.    Time 6    Period Weeks    Status On-going      PT LONG TERM GOAL #3   Title Patient to demonstrate R elbow and wrist AROM WFL and without pain limiting.    Time 6    Period Weeks    Status On-going      PT LONG TERM GOAL #4   Title Patient to report tolerance for household chores without pain limiting.    Time 6    Period Weeks    Status On-going      PT LONG TERM GOAL #5   Title Patient to report 75% improvement in pain.    Time 6    Period Weeks    Status On-going                 Plan - 12/10/20 1259    Clinical Impression Statement Pt. denies questions regarding initial HEP.  STG #1 met.  Noting some improvement in R forearm pain since starting therapy.  Did encourage pt. to limit her home calisthenics exercises as a few described exercises may be contributing to her ongoing pain.  Progressed R wrist/forearm strengthening activities without issue today.  R forearm pain reaching 3-4/10 at times today which quickly resolved.  Ended visit with HEP update with handout issued to pt.    Comorbidities TIA 2005, osteopenia, LE edema, HTN, B hip pain, GERD, back pain, B knee pain, anemia    Rehab Potential Good    PT Frequency 1x / week    PT Duration 6 weeks    PT Treatment/Interventions ADLs/Self Care Home Management;Cryotherapy;Electrical Stimulation;Iontophoresis 4mg /ml Dexamethasone;Moist Heat;Therapeutic exercise;Therapeutic activities;Functional mobility training;Ultrasound;Neuromuscular re-education;Patient/family education;Manual techniques;Vasopneumatic Device;Taping;Energy conservation;Dry needling;Passive range of motion    PT Next Visit Plan Progress R wrist, elbow, grip strengthening, STM and stretching, periscapular and rotator cuff strengthening as appropriate  Consulted and Agree with Plan  of Care Patient;Family member/caregiver    Family Member Consulted husband           Patient will benefit from skilled therapeutic intervention in order to improve the following deficits and impairments:  Increased edema,Decreased activity tolerance,Decreased strength,Increased fascial restricitons,Impaired UE functional use,Pain,Increased muscle spasms,Improper body mechanics,Decreased range of motion,Impaired flexibility,Postural dysfunction  Visit Diagnosis: Pain in right elbow  Stiffness of right wrist, not elsewhere classified  Stiffness of right elbow, not elsewhere classified  Muscle weakness (generalized)  Other symptoms and signs involving the musculoskeletal system     Problem List Patient Active Problem List   Diagnosis Date Noted  . Right lateral epicondylitis 10/22/2020  . Right wrist pain 09/10/2020  . Low back pain 07/27/2020  . Skin tags, multiple acquired 06/01/2020  . Cervical cancer screening 06/01/2020  . Chronic vaginitis 06/01/2020  . Arthralgia 06/01/2020  . Vitamin D deficiency 11/26/2018  . Prediabetes 11/26/2018  . Class 1 obesity with serious comorbidity and body mass index (BMI) of 30.0 to 30.9 in adult 09/24/2018  . Osteopenia 01/23/2018  . Patellofemoral arthritis of right knee 11/16/2016  . Greater trochanteric bursitis of both hips 01/20/2015  . Pes planus of both feet 01/20/2015  . Hot flash, menopausal 01/07/2014  . Preventative health care 01/07/2014  . Hip pain, bilateral 09/18/2013  . Myalgia and myositis 09/18/2013  . Dermatitis 09/18/2013  . TIA (transient ischemic attack) 09/18/2013  . Allergic state   . Anemia   . Hypertension   . UTI (urinary tract infection)   . GERD (gastroesophageal reflux disease)   . Lactose intolerance   . H/O atrial septal defect   . Endometriosis     Bess Harvest, PTA 12/10/20 1:17 PM   [facility] [address]  [place], [place], [address] Phone: [phone]   Fax:  [phone]  Name: Jenna Johnson MRN: [id number] Date of Birth: 04/20/1956  PHYSICAL THERAPY DISCHARGE SUMMARY  Visits from Start of Care: 3  Current functional level related to goals / functional outcomes: Unable to assess; patient requested to cx all appointments d/t cost   Remaining deficits: Unable to assess   Education / Equipment: HEP  Plan: Patient agrees to discharge.  Patient goals were not met. Patient is being discharged due to financial reasons.  ?????     Janene Harvey, PT, DPT 12/25/20 9:04 AM

## 2020-12-17 ENCOUNTER — Ambulatory Visit: Payer: 59

## 2020-12-24 ENCOUNTER — Ambulatory Visit (INDEPENDENT_AMBULATORY_CARE_PROVIDER_SITE_OTHER): Payer: 59 | Admitting: Family Medicine

## 2020-12-24 ENCOUNTER — Other Ambulatory Visit: Payer: Self-pay

## 2020-12-24 ENCOUNTER — Encounter: Payer: Self-pay | Admitting: Family Medicine

## 2020-12-24 ENCOUNTER — Encounter: Payer: 59 | Admitting: Physical Therapy

## 2020-12-24 DIAGNOSIS — M7711 Lateral epicondylitis, right elbow: Secondary | ICD-10-CM

## 2020-12-24 DIAGNOSIS — M1711 Unilateral primary osteoarthritis, right knee: Secondary | ICD-10-CM

## 2020-12-24 NOTE — Patient Instructions (Signed)
See if insurance can find PT clinic Making progress  See me in 2 months

## 2020-12-24 NOTE — Assessment & Plan Note (Signed)
Patient still having more of the lateral epicondylitis.  Patient does seem to be making some progress at this time though.  Patient is using a counterforce strap and seems to do better than the wrist brace.  We discussed potentially using a wrist brace at night.  Discussed with patient to continue the home exercises.  Patient will check with insurance to see if there is an approved physical therapy place that would not cost so much out-of-pocket and then we would continue other treatment.  Follow-up again 6 to 8 weeks.

## 2020-12-24 NOTE — Assessment & Plan Note (Signed)
Patient doing relatively well.  Does not want any type of injections.  Patient has responded to meloxicam previously.  Doing relatively well and follow-up with me again in 2 months

## 2020-12-24 NOTE — Progress Notes (Signed)
[facility] [address] [place] [address] Phone: [phone] Subjective:   Jenna Johnson, am serving as a scribe for Dr. Antoine Johnson. This visit occurred during the SARS-CoV-2 public health emergency.  Safety protocols were in place, including screening questions prior to the visit, additional usage of staff PPE, and extensive cleaning of exam room while observing appropriate contact time as indicated for disinfecting solutions.   I'm seeing this patient by the request  of:  Bradd Canary, MD  CC: Right elbow pain follow-up  GHW:[id number]   10/22/2020 Patient symptoms now seem to be more corresponding with the lateral epicondylitis and at this point.  Seems to be worsening pain at the wrist all the way up the forearm with any type of resisted extension of the wrist.  We discussed with patient about bracing of the wrist.  Topical anti-inflammatories.  Will be referred to formal physical therapy.  Follow-up again 6 to 8 weeks  Update 12/24/2020 Jenna Johnson is a 65 y.o. female coming in with complaint of right wrist and elbow pain. Patient has done a few sessions of physical therapy. Patient states that she feels improvement especially with use of counterforce brace over forearm. Insurance changed in January so she has not been back to physical therapy.  Would state overall doing better.  Also has bilateral knee pain and uses braces for pain relief.  States not stopping her from any activities at this moment.      Past Medical History:  Diagnosis Date  . Allergic state   . Allergy   . Anemia    long time ago per pt   . Arthritis of knee, right    and left knee   . Back pain   . Bursitis of both hips   . Cataract    forming  right eye she thinks   . Chicken pox as a child  . Constipation   . Dermatitis 09/18/2013  . Endometriosis   . GERD (gastroesophageal reflux disease)    changed diet and better   . H/O atrial septal  defect   . Hip pain, bilateral 09/18/2013  . Hypertension   . IBS (irritable bowel syndrome)   . Joint pain   . Lactose intolerance   . Leg edema   . Measles as a child  . Myalgia and myositis 09/18/2013  . Osteopenia    mild   . Preventative health care 01/07/2014  . TIA (transient ischemic attack) 2005  . UTI (urinary tract infection)    Past Surgical History:  Procedure Laterality Date  . ABDOMINAL HYSTERECTOMY  65 yrs old   still has one ovary  . APPENDECTOMY  65 yrs old  . ASD REPAIR  2006  . COLONOSCOPY     10 yr in [place]   . hole in heart    . RIGHT OOPHORECTOMY     ruptured ovarian cyst at age 83  . tia  surgery 2005   Social History   Socioeconomic History  . Marital status: Married    Spouse name: Yennifer Segovia  . Number of children: 0  . Years of education: Not on file  . Highest education level: Not on file  Occupational History  . Occupation: Retired  Tobacco Use  . Smoking status: Never Smoker  . Smokeless tobacco: Never Used  Substance and Sexual Activity  . Alcohol use: Yes    Comment: occasionally  . Drug use: No  . Sexual activity:  Yes    Partners: Male  Other Topics Concern  . Not on file  Social History Narrative  . Not on file   Social Determinants of Health   Financial Resource Strain: Not on file  Food Insecurity: Not on file  Transportation Needs: Not on file  Physical Activity: Not on file  Stress: Not on file  Social Connections: Not on file   Allergies  Allergen Reactions  . Codeine   . Tylenol [Acetaminophen]    Family History  Problem Relation Age of Onset  . Multiple sclerosis Mother   . Other Mother        blue tumor, air embolism,  . Hypertension Mother   . Obesity Mother   . Diabetes Father 63       type 2  . Other Father        fatty liver, lactose intolerant  . Thyroid disease Father   . Colon polyps Father   . Cancer Paternal Grandmother 82       breast  . Diabetes Paternal Grandmother   . Breast cancer  Paternal Grandmother   . Other Sister        s/p hysterectomy  . Other Sister        s/p hysterectomy  . Colon cancer Neg Hx   . Esophageal cancer Neg Hx   . Stomach cancer Neg Hx   . Rectal cancer Neg Hx      Current Outpatient Medications (Cardiovascular):  .  amLODipine (NORVASC) 2.5 MG tablet, Take 1 tablet (2.5 mg total) by mouth daily.  Current Outpatient Medications (Respiratory):  .  fluticasone (FLONASE) 50 MCG/ACT nasal spray, Place 2 sprays into both nostrils daily. Marland Kitchen  loratadine (CLARITIN) 10 MG tablet, Take 10 mg by mouth daily as needed.   Current Outpatient Medications (Analgesics):  .  acetaminophen (TYLENOL) 650 MG CR tablet, Take 650 mg by mouth every 8 (eight) hours as needed for pain. Marland Kitchen  aspirin EC 81 MG tablet, Take 1 tablet (81 mg total) by mouth daily. Marland Kitchen  ibuprofen (ADVIL,MOTRIN) 200 MG tablet, Take 200 mg by mouth every 6 (six) hours as needed. .  meloxicam (MOBIC) 7.5 MG tablet, Take 1 tablet (7.5 mg total) by mouth daily.   Current Outpatient Medications (Other):  .  amoxicillin-clavulanate (AUGMENTIN) 875-125 MG tablet, Take 1 tablet by mouth 2 (two) times daily. .  cholecalciferol (VITAMIN D3) 25 MCG (1000 UT) tablet, Take 2,000 Units by mouth daily. Patient taking 3x/week .  fluconazole (DIFLUCAN) 150 MG tablet, 1 po x1, may repeat in 3 days prn   Reviewed prior external information including notes and imaging from  primary care provider As well as notes that were available from care everywhere and other healthcare systems.  Past medical history, social, surgical and family history all reviewed in electronic medical record.  No pertanent information unless stated regarding to the chief complaint.   Review of Systems:  No headache, visual changes, nausea, vomiting, diarrhea, constipation, dizziness, abdominal pain, skin rash, fevers, chills, night sweats, weight loss, swollen lymph nodes, joint swelling, chest pain, shortness of breath, mood changes.  POSITIVE muscle aches, body aches  Objective  Blood pressure 110/76, pulse 70, height 5\' 1"  (1.549 m), SpO2 97 %.   General: No apparent distress alert and oriented x3 mood and affect normal, dressed appropriately.  HEENT: Pupils equal, extraocular movements intact  Respiratory: Patient's speak in full sentences and does not appear short of breath  Cardiovascular: No lower extremity edema, non tender,  no erythema  Gait very mild antalgic MSK: Patient's right elbow is still tender to palpation over the lateral epicondylar region.  Does have pain with resisted wrist extension at the elbow.  Full range of motion of the elbow good grip strength noted.  Negative Tinel's at the carpal tunnel.    Impression and Recommendations:     The above documentation has been reviewed and is accurate and complete Judi Saa, DO

## 2020-12-31 ENCOUNTER — Encounter: Payer: 59 | Admitting: Physical Therapy

## 2021-01-01 ENCOUNTER — Telehealth: Payer: Self-pay

## 2021-01-01 DIAGNOSIS — Z8679 Personal history of other diseases of the circulatory system: Secondary | ICD-10-CM

## 2021-01-01 NOTE — Telephone Encounter (Signed)
Spoke w/ Pt- informed of PCP recommendations. Pt has not seen cardiology in years, agreed to see Dr. Antoine Poche (her husband see's him). Referral placed.

## 2021-01-01 NOTE — Addendum Note (Signed)
Addended byConrad Holly D on: 01/01/2021 02:07 PM   Modules accepted: Orders

## 2021-01-01 NOTE — Telephone Encounter (Signed)
I have never explored her atrial septal defect so do not know the extent. My recommendation is we refer her to cardiology for evaluation or at least run an echo to investigate then I can offer a better recommendation.

## 2021-01-01 NOTE — Telephone Encounter (Signed)
I called Pt's husband to inform him that he could stop aspirin daily if he'd like. They asked if his wife could also stop. She has only the hx of atrial septal defect. Does not currently have a cardiologist. I informed I'd send message to PCP to find out.

## 2021-01-16 ENCOUNTER — Other Ambulatory Visit: Payer: Self-pay | Admitting: Family Medicine

## 2021-01-22 ENCOUNTER — Ambulatory Visit: Payer: 59 | Admitting: Cardiology

## 2021-01-28 DIAGNOSIS — Q211 Atrial septal defect, unspecified: Secondary | ICD-10-CM | POA: Insufficient documentation

## 2021-01-28 NOTE — Progress Notes (Addendum)
Cardiology Office Note   Date:  01/29/2021   ID:  Serita, Degroote [birth year], MRN [id number]  PCP:  Bradd Canary, MD  Cardiologist:   No primary care provider on file. Referring:  Bradd Canary, MD  Chief Complaint  Patient presents with  . Atrial Septal Defect      History of Present Illness: Jenna Johnson is a 65 y.o. female who presents for evaluation of an ASD.   She had a TIA in 2006.  She was living in [place] and apparently had obstructive occluder device.  She says she had follow-up a few times after that and then was released from follow-up.  She has not had any problems since then.  She is never had any recurrent symptoms.  She denies any cardiovascular complaints. The patient denies any new symptoms such as chest discomfort, neck or arm discomfort. There has been no new shortness of breath, PND or orthopnea. There have been no reported palpitations, presyncope or syncope.  She exercises riding a bike and walking.   Past Medical History:  Diagnosis Date  . Anemia    long time ago per pt   . Arthritis of knee, right    and left knee   . Back pain   . Bursitis of both hips   . Cataract    forming  right eye she thinks   . Dermatitis 09/18/2013  . Endometriosis   . GERD (gastroesophageal reflux disease)    changed diet and better   . H/O atrial septal defect   . Hypertension   . IBS (irritable bowel syndrome)   . Lactose intolerance   . Myalgia and myositis 09/18/2013  . Osteopenia    mild   . TIA (transient ischemic attack) 2005    Past Surgical History:  Procedure Laterality Date  . ABDOMINAL HYSTERECTOMY  65 yrs old   still has one ovary  . APPENDECTOMY  65 yrs old  . ASD REPAIR  2006  . COLONOSCOPY     10 yr in [place]   . hole in heart    . RIGHT OOPHORECTOMY     ruptured ovarian cyst at age 27  . tia  surgery 2005     Current Outpatient Medications  Medication Sig Dispense Refill  . acetaminophen (TYLENOL) 650 MG CR  tablet Take 650 mg by mouth every 8 (eight) hours as needed for pain.    Marland Kitchen amLODipine (NORVASC) 2.5 MG tablet Take 1 tablet (2.5 mg total) by mouth daily. 90 tablet 1  . aspirin EC 81 MG tablet Take 1 tablet (81 mg total) by mouth daily.    . Chlorpheniramine Maleate (CHLOR-TABLETS PO) Take by mouth daily.    . cholecalciferol (VITAMIN D3) 25 MCG (1000 UT) tablet Take 2,000 Units by mouth daily. Patient taking 3x/week    . fluticasone (FLONASE) 50 MCG/ACT nasal spray Place 2 sprays into both nostrils daily. 16 g 6  . meloxicam (MOBIC) 7.5 MG tablet TAKE 1 TABLET(7.5 MG) BY MOUTH DAILY 90 tablet 0   No current facility-administered medications for this visit.    Allergies:   Codeine and Tylenol [acetaminophen]    Social History:  The patient  reports that she has never smoked. She has never used smokeless tobacco. She reports current alcohol use. She reports that she does not use drugs.   Family History:  The patient's family history includes Breast cancer in her paternal grandmother; Cancer (age of onset: 25) in her paternal  grandmother; Colon polyps in her father; Diabetes in her paternal grandmother; Diabetes (age of onset: 47) in her father; Hypertension in her mother; Multiple sclerosis in her mother; Obesity in her mother; Other in her father, mother, sister, and sister; Thyroid disease in her father.    ROS:  Please see the history of present illness.   Otherwise, review of systems are positive for none.   All other systems are reviewed and negative.    PHYSICAL EXAM: VS:  BP 124/72 (BP Location: Left Arm, Patient Position: Sitting)   Pulse 69   Ht 5' 0.25" (1.53 m)   Wt 159 lb 3.2 oz (72.2 kg)   SpO2 94%   BMI 30.83 kg/m  , BMI Body mass index is 30.83 kg/m. GENERAL:  Well appearing HEENT:  Pupils equal round and reactive, fundi not visualized, oral mucosa unremarkable NECK:  No jugular venous distention, waveform within normal limits, carotid upstroke brisk and symmetric, no  bruits, no thyromegaly LYMPHATICS:  No cervical, inguinal adenopathy LUNGS:  Clear to auscultation bilaterally BACK:  No CVA tenderness CHEST:  Unremarkable HEART:  PMI not displaced or sustained,S1 and S2 within normal limits, no S3, no S4, no clicks, no rubs, no murmurs ABD:  Flat, positive bowel sounds normal in frequency in pitch, no bruits, no rebound, no guarding, no midline pulsatile mass, no hepatomegaly, no splenomegaly EXT:  2 plus pulses throughout, no edema, no cyanosis no clubbing SKIN:  No rashes no nodules NEURO:  Cranial nerves II through XII grossly intact, motor grossly intact throughout PSYCH:  Cognitively intact, oriented to person place and time    EKG:  EKG is ordered today. The ekg ordered today demonstrates sinus rhythm, rate 69, axis within normal limits, intervals within normal limits, no acute ST-T wave changes.   Recent Labs: 09/10/2020: ALT 18; BUN 16; Creatinine, Ser 0.95; Hemoglobin 13.3; Platelets 151.0; Potassium 3.8; Sodium 141; TSH 1.18    Lipid Panel    Component Value Date/Time   CHOL 180 06/03/2020 0834   CHOL 146 08/27/2018 1300   TRIG 61.0 06/03/2020 0834   HDL 64.40 06/03/2020 0834   HDL 61 08/27/2018 1300   CHOLHDL 3 06/03/2020 0834   VLDL 12.2 06/03/2020 0834   LDLCALC 103 (H) 06/03/2020 0834   LDLCALC 73 08/27/2018 1300      Wt Readings from Last 3 Encounters:  01/29/21 159 lb 3.2 oz (72.2 kg)  11/23/20 161 lb 3.2 oz (73.1 kg)  10/22/20 156 lb (70.8 kg)      Other studies Reviewed: Additional studies/ records that were reviewed today include: None. Review of the above records demonstrates:  Please see elsewhere in the note.     ASSESSMENT AND PLAN:  ASD:     I have sent a message to Dr. Excell Seltzer to get his opinion on whether she should be continuing aspirin long-term because she had a first generation occluder device.  She says she was 43rd [place] the [place] apparently after this was approved and not in the clinical trial.  She  otherwise would not have an indication for aspirin.  I will check an echocardiogram.  Otherwise no change in therapy.  After the visit I did discuss this with Dr. Excell Seltzer and he would suggest lifelong aspirin  HTN: Blood pressure is well controlled.  She will continue the meds as listed.  ASD   Current medicines are reviewed at length with the patient today.  The patient does not have concerns regarding medicines.  The following changes  have been made:  no change  Labs/ tests ordered today include:   Orders Placed This Encounter  Procedures  . EKG 12-Lead  . ECHOCARDIOGRAM COMPLETE     Disposition:   FU with me as needed    Signed, Rollene Rotunda, MD  01/29/2021 12:12 PM     [facility]

## 2021-01-29 ENCOUNTER — Other Ambulatory Visit: Payer: Self-pay

## 2021-01-29 ENCOUNTER — Ambulatory Visit: Payer: 59 | Admitting: Cardiology

## 2021-01-29 ENCOUNTER — Encounter: Payer: Self-pay | Admitting: Cardiology

## 2021-01-29 VITALS — BP 124/72 | HR 69 | Ht 60.25 in | Wt 159.2 lb

## 2021-01-29 DIAGNOSIS — Q211 Atrial septal defect, unspecified: Secondary | ICD-10-CM

## 2021-01-29 NOTE — Patient Instructions (Addendum)
Medication Instructions:  No changes at this time *If you need a refill on your cardiac medications before your next appointment, please call your pharmacy*  Testing: Your physician has requested that you have an echocardiogram. Echocardiography is a painless test that uses sound waves to create images of your heart. It provides your doctor with information about the size and shape of your heart and how well your heart's chambers and valves are working. This procedure takes approximately one hour. There are no restrictions for this procedure. [address]  Follow-Up: At [facility], you and your health needs are our priority.  As part of our continuing mission to provide you with exceptional heart care, we have created designated Provider Care Teams.  These Care Teams include your primary Cardiologist (physician) and Advanced Practice Providers (APPs -  Physician Assistants and Nurse Practitioners) who all work together to provide you with the care you need, when you need it.    Your next appointment:   No follow up needed

## 2021-02-01 ENCOUNTER — Ambulatory Visit (INDEPENDENT_AMBULATORY_CARE_PROVIDER_SITE_OTHER): Payer: 59 | Admitting: Family Medicine

## 2021-02-01 ENCOUNTER — Encounter (INDEPENDENT_AMBULATORY_CARE_PROVIDER_SITE_OTHER): Payer: Self-pay | Admitting: Family Medicine

## 2021-02-01 ENCOUNTER — Other Ambulatory Visit: Payer: Self-pay

## 2021-02-01 VITALS — BP 125/82 | HR 79 | Temp 97.9°F | Ht 61.0 in | Wt 152.0 lb

## 2021-02-01 DIAGNOSIS — E669 Obesity, unspecified: Secondary | ICD-10-CM | POA: Diagnosis not present

## 2021-02-01 DIAGNOSIS — Z6830 Body mass index (BMI) 30.0-30.9, adult: Secondary | ICD-10-CM

## 2021-02-01 DIAGNOSIS — R7303 Prediabetes: Secondary | ICD-10-CM

## 2021-02-01 DIAGNOSIS — I10 Essential (primary) hypertension: Secondary | ICD-10-CM

## 2021-02-01 MED ORDER — AMLODIPINE BESYLATE 2.5 MG PO TABS: 2.5000 mg | ORAL_TABLET | Freq: Every day | ORAL | 1 refills | Status: DC

## 2021-02-02 ENCOUNTER — Encounter (INDEPENDENT_AMBULATORY_CARE_PROVIDER_SITE_OTHER): Payer: Self-pay | Admitting: Family Medicine

## 2021-02-02 NOTE — Progress Notes (Addendum)
Chief Complaint:   OBESITY Jenna Johnson is here to discuss her progress with her obesity treatment plan along with follow-up of her obesity related diagnoses. Jenna Johnson is journaling with Noom and states she is following her eating plan approximately 100% of the time. [name] states she is walking for 20 minutes 2-3 times per week.  Today's visit was #: 31 Starting weight: 171 lbs Starting date: 08/27/2018 Today's weight: 152 lbs Today's date: 02/01/2021 Total lbs lost to date: 19 lbs Total lbs lost since last in-office visit: (+2)  Interim History: Jenna Johnson is still using Noom and journaling.  She eats out 3 times a week and notes when she eats bread her weight increases.  Goal weight is 145-150 pounds.  She is averaging 1200 calories per day and protein at all meals. She does admit to exercising less over the past several months.   Subjective:   1. Essential hypertension Well controlled on amlodipine 2.5 mg daily.  She endorses constipation.  BP Readings from Last 3 Encounters:  02/01/21 125/82  01/29/21 124/72  12/24/20 110/76   2. Prediabetes Last A1c was 5.7.  She is not on metformin.  Lab Results  Component Value Date   HGBA1C 5.7 06/03/2020   Lab Results  Component Value Date   INSULIN 8.4 08/27/2018   Assessment/Plan:   1. Essential hypertension Continue amlodipine 2.5 mg daily.  Will refill today.  - Refill amLODipine (NORVASC) 2.5 MG tablet; Take 1 tablet (2.5 mg total) by mouth daily.  Dispense: 90 tablet; Refill: 1  2. Prediabetes Have PCP check A1c in June 2022.  3. Class 1 obesity with serious comorbidity and body mass index (BMI) of 30.0 to 30.9 in adult, unspecified obesity type  Jenna Johnson is currently in the action stage of change. As such, her goal is to continue with weight loss efforts. She has agreed to journaling with Noom.   Exercise goals: For substantial health benefits, adults should do at least 150 minutes (2 hours and 30  minutes) a week of moderate-intensity, or 75 minutes (1 hour and 15 minutes) a week of vigorous-intensity aerobic physical activity, or an equivalent combination of moderate- and vigorous-intensity aerobic activity. Aerobic activity should be performed in episodes of at least 10 minutes, and preferably, it should be spread throughout the week. Restart exercise regimen of cardio / calisthenics.  Behavioral modification strategies: planning for success.  Jenna Johnson has agreed to follow-up with our clinic in 6 months per pt request.  Objective:   Blood pressure 125/82, pulse 79, temperature 97.9 F (36.6 C), height 5\' 1"  (1.549 m), weight 152 lb (68.9 kg), SpO2 99 %. Body mass index is 28.72 kg/m.  General: Cooperative, alert, well developed, in no acute distress. HEENT: Conjunctivae and lids unremarkable. Cardiovascular: Regular rhythm.  Lungs: Normal work of breathing. Neurologic: No focal deficits.   Lab Results  Component Value Date   CREATININE 0.95 09/10/2020   BUN 16 09/10/2020   NA 141 09/10/2020   K 3.8 09/10/2020   CL 105 09/10/2020   CO2 32 09/10/2020   Lab Results  Component Value Date   ALT 18 09/10/2020   AST 21 09/10/2020   ALKPHOS 72 09/10/2020   BILITOT 0.4 09/10/2020   Lab Results  Component Value Date   HGBA1C 5.7 06/03/2020   HGBA1C 5.8 10/16/2019   HGBA1C 5.9 12/26/2018   HGBA1C 5.9 (H) 08/27/2018   Lab Results  Component Value Date   INSULIN 8.4 08/27/2018   Lab Results  Component Value  Date   TSH 1.18 09/10/2020   Lab Results  Component Value Date   CHOL 180 06/03/2020   HDL 64.40 06/03/2020   LDLCALC 103 (H) 06/03/2020   TRIG 61.0 06/03/2020   CHOLHDL 3 06/03/2020   Lab Results  Component Value Date   WBC 7.4 09/10/2020   HGB 13.3 09/10/2020   HCT 40.8 09/10/2020   MCV 86.7 09/10/2020   PLT 151.0 09/10/2020   Lab Results  Component Value Date   IRON 103 09/10/2020   Attestation Statements:   Reviewed by clinician on day of  visit: allergies, medications, problem list, medical history, surgical history, family history, social history, and previous encounter notes.  I, Insurance claims handler, CMA, am acting as Energy manager for [place], FNP.  I have reviewed the above documentation for accuracy and completeness, and I agree with the above. -  Jesse Sans, FNP

## 2021-02-22 ENCOUNTER — Telehealth: Payer: Self-pay | Admitting: Family Medicine

## 2021-02-22 NOTE — Telephone Encounter (Signed)
Pt states she saw Dr. Laury Axon back in Nov or Dec for ear pain. Pt states she is still having pain in her left ear states that she is flying out on Sunday going out of the states and feels like she should see someone prior to. Pt states the ear is still full of fluid that just will not go away. Pt would like to know if she should come in and see Dr. Abner Greenspan or does she need to see an ENT.

## 2021-02-22 NOTE — Telephone Encounter (Signed)
Pt is scheduled to see Ramon Dredge tomorrow at 1:20

## 2021-02-23 ENCOUNTER — Other Ambulatory Visit: Payer: Self-pay

## 2021-02-23 ENCOUNTER — Ambulatory Visit: Payer: 59 | Admitting: Medical

## 2021-02-23 ENCOUNTER — Encounter: Payer: Self-pay | Admitting: Medical

## 2021-02-23 VITALS — BP 132/68 | HR 68 | Temp 97.9°F | Resp 20 | Ht 61.0 in | Wt 157.6 lb

## 2021-02-23 DIAGNOSIS — J3489 Other specified disorders of nose and nasal sinuses: Secondary | ICD-10-CM | POA: Diagnosis not present

## 2021-02-23 DIAGNOSIS — J309 Allergic rhinitis, unspecified: Secondary | ICD-10-CM | POA: Diagnosis not present

## 2021-02-23 DIAGNOSIS — K5900 Constipation, unspecified: Secondary | ICD-10-CM | POA: Diagnosis not present

## 2021-02-23 DIAGNOSIS — H6982 Other specified disorders of Eustachian tube, left ear: Secondary | ICD-10-CM

## 2021-02-23 MED ORDER — PREDNISONE 10 MG PO TABS: ORAL_TABLET | ORAL | 0 refills | Status: DC

## 2021-02-23 MED ORDER — CEFDINIR 300 MG PO CAPS: 300.0000 mg | ORAL_CAPSULE | Freq: Two times a day (BID) | ORAL | 0 refills | Status: DC

## 2021-02-23 MED ORDER — LEVOCETIRIZINE DIHYDROCHLORIDE 5 MG PO TABS: 5.0000 mg | ORAL_TABLET | Freq: Every evening | ORAL | 3 refills | Status: DC

## 2021-02-23 NOTE — Patient Instructions (Addendum)
You do have history of chronic intermittent ear pressure with some frontal sinus area discomfort.  Suspect that you had some eustachian tube dysfunction and possible sinus infection.  Suspects allergic rhinitis component.  No wax present on exam tympanic membranes/eardrums do not look infected.  For possible sinus infection prescribe cefdinir antibiotic.  For allergies prescribe Xyzal antihistamine, continue Flonase and making 4-day taper dose of prednisone available in light of your upcoming trip/ flights this weekend.  Would also recommend using Afrin spray 30 minutes prior to flying.  Asked that you update me by MyChart how you doing Tuesday morning.  If symptoms persist or worsening then would refer to allergist or ENT.  For described  small hard stools daily basis recommend continue conservative measures for constipation such as exercise, healthy diet and keeping self hydrated.  If you feel you need to have a good bowel movement could occasionally use Dulcolax or MiraLAX every 3 to 4 days.  Follow-up in 7 days or as needed.

## 2021-02-23 NOTE — Progress Notes (Signed)
Subjective:    Patient ID: Jenna Johnson, female    DOB: 05-Feb-1956, 65 y.o.   MRN: [id number]  HPI  Pt in with some left side ear pain/clogged sensation. This has been on and off since December.  At one point pt was on antibiotic in December. Also pt has been using  flonase and clor-tab since December.  Pt ear has not felt normal . Occasional vibrating sensation. Ears won't pop.  Pt will be flying on Sunday afternoon. Returning on Wed evening.   Pt had some itching eyes over past month.   Rt ear mild pressure.  Pt is not diabetic on review.  Also some hx of chronic constipation. Small hard stool daily in morning. Exercising, staying hydrated and plenty of fiber.    Review of Systems  Constitutional: Negative for chills, fatigue and fever.  HENT: Positive for congestion, ear pain, postnasal drip, sinus pressure, sinus pain and sneezing. Negative for sore throat.   Respiratory: Negative for cough, chest tightness and wheezing.   Cardiovascular: Negative for chest pain and palpitations.  Gastrointestinal: Positive for constipation. Negative for abdominal pain.       See hpi.  Genitourinary: Negative for dysuria.  Musculoskeletal: Negative for back pain and myalgias.  Skin: Negative for rash.  Hematological: Negative for adenopathy. Does not bruise/bleed easily.  Psychiatric/Behavioral: Negative for behavioral problems.   Past Medical History:  Diagnosis Date  . Anemia    long time ago per pt   . Arthritis of knee, right    and left knee   . Back pain   . Bursitis of both hips   . Cataract    forming  right eye she thinks   . Dermatitis 09/18/2013  . Endometriosis   . GERD (gastroesophageal reflux disease)    changed diet and better   . H/O atrial septal defect   . Hypertension   . IBS (irritable bowel syndrome)   . Lactose intolerance   . Myalgia and myositis 09/18/2013  . Osteopenia    mild   . TIA (transient ischemic attack) 2005     Social  History   Socioeconomic History  . Marital status: Married    Spouse name: Leon Ritchie  . Number of children: 0  . Years of education: Not on file  . Highest education level: Not on file  Occupational History  . Occupation: Retired  Tobacco Use  . Smoking status: Never Smoker  . Smokeless tobacco: Never Used  Substance and Sexual Activity  . Alcohol use: Yes    Comment: occasionally  . Drug use: No  . Sexual activity: Yes    Partners: Male  Other Topics Concern  . Not on file  Social History Narrative  . Not on file   Social Determinants of Health   Financial Resource Strain: Not on file  Food Insecurity: Not on file  Transportation Needs: Not on file  Physical Activity: Not on file  Stress: Not on file  Social Connections: Not on file  Intimate Partner Violence: Not on file    Past Surgical History:  Procedure Laterality Date  . ABDOMINAL HYSTERECTOMY  65 yrs old   still has one ovary  . APPENDECTOMY  65 yrs old  . ASD REPAIR  2006  . COLONOSCOPY     10  yr in   . hole in heart    . RIGHT OOPHORECTOMY     ruptured ovarian cyst at age 50  . tia  surgery 2005  Family History  Problem Relation Age of Onset  . Multiple sclerosis Mother   . Other Mother        blue tumor, air embolism,  . Hypertension Mother   . Obesity Mother   . Diabetes Father 38       type 2  . Other Father        fatty liver, lactose intolerant  . Thyroid disease Father   . Colon polyps Father   . Cancer Paternal Grandmother 12       breast  . Diabetes Paternal Grandmother   . Breast cancer Paternal Grandmother   . Other Sister        s/p hysterectomy  . Other Sister        s/p hysterectomy  . Colon cancer Neg Hx   . Esophageal cancer Neg Hx   . Stomach cancer Neg Hx   . Rectal cancer Neg Hx     Allergies  Allergen Reactions  . Codeine     Current Outpatient Medications on File Prior to Visit  Medication Sig Dispense Refill  . acetaminophen (TYLENOL) 650 MG  CR tablet Take 650 mg by mouth every 8 (eight) hours as needed for pain.    Marland Kitchen amLODipine (NORVASC) 2.5 MG tablet Take 1 tablet (2.5 mg total) by mouth daily. 90 tablet 1  . aspirin EC 81 MG tablet Take 1 tablet (81 mg total) by mouth daily.    . cholecalciferol (VITAMIN D3) 25 MCG (1000 UT) tablet Take 2,000 Units by mouth daily. Patient taking 3x/week    . fluticasone (FLONASE) 50 MCG/ACT nasal spray Place 2 sprays into both nostrils daily. 16 g 6  . meloxicam (MOBIC) 7.5 MG tablet TAKE 1 TABLET(7.5 MG) BY MOUTH DAILY 90 tablet 0   No current facility-administered medications on file prior to visit.    BP 132/68   Pulse 68   Temp 97.9 F (36.6 C)   Resp 20   Ht 5\' 1"  (1.549 m)   Wt 157 lb 9.6 oz (71.5 kg)   SpO2 100%   BMI 29.78 kg/m      Objective:   Physical Exam  General  Mental Status - Alert. General Appearance - Well groomed. Not in acute distress.  Skin Rashes- No Rashes.  HEENT Head- Normal. Ear Auditory Canal - Left- Normal. Right - Normal.Tympanic Membrane- Left- Normal. Right- Normal. Eye Sclera/Conjunctiva- Left- Normal. Right- Normal. Nose & Sinuses Nasal Mucosa- Left-  Boggy and Congested. Right-  Boggy and  Congested.Bilateral no maxillary but frontal sinus pressure. Mouth & Throat Lips: Upper Lip- Normal: no dryness, cracking, pallor, cyanosis, or vesicular eruption. Lower Lip-Normal: no dryness, cracking, pallor, cyanosis or vesicular eruption. Buccal Mucosa- Bilateral- No Aphthous ulcers. Oropharynx- No Discharge or Erythema. Tonsils: Characteristics- Bilateral- No Erythema or Congestion. Size/Enlargement- Bilateral- No enlargement. Discharge- bilateral-None.  Neck Neck- Supple. No Masses.   Chest and Lung Exam Auscultation: Breath Sounds:-Clear even and unlabored.  Cardiovascular Auscultation:Rythm- Regular, rate and rhythm. Murmurs & Other Heart Sounds:Ausculatation of the heart reveal- No Murmurs.  Lymphatic Head & Neck General Head &  Neck Lymphatics: Bilateral: Description- No Localized lymphadenopathy.       Assessment & Plan:  You do have history of chronic intermittent ear pressure with some frontal sinus area discomfort.  Suspect that you had some eustachian tube dysfunction and possible sinus infection.  Suspects allergic rhinitis component.  No wax present on exam tympanic membranes/eardrums do not look infected.  For possible sinus infection prescribe cefdinir antibiotic.  For allergies prescribe Xyzal antihistamine, continue Flonase and making 4-day taper dose of prednisone available in light of your upcoming trip/ flights this weekend.  Would also recommend using Afrin spray 30 minutes prior to flying.  Asked that you update me by MyChart how you doing Tuesday morning.  If symptoms persist or worsening then would refer to allergist or ENT.  Follow-up in 7 days or as needed.  Esperanza Richters, PA-C

## 2021-02-24 ENCOUNTER — Ambulatory Visit (HOSPITAL_COMMUNITY): Payer: 59 | Attending: Internal Medicine

## 2021-02-24 DIAGNOSIS — Q211 Atrial septal defect, unspecified: Secondary | ICD-10-CM

## 2021-02-24 LAB — ECHOCARDIOGRAM COMPLETE
Area-P 1/2: 3.6 cm2
S' Lateral: 1.8 cm

## 2021-02-24 NOTE — Progress Notes (Unsigned)
Jenna Johnson [address] [place] [address] Phone: [phone] Subjective:   Jenna Johnson, am serving as a scribe for Dr. Antoine Johnson. This visit occurred during the SARS-CoV-2 public health emergency.  Safety protocols were in place, including screening questions prior to the visit, additional usage of staff PPE, and extensive cleaning of exam room while observing appropriate contact time as indicated for disinfecting solutions.   I'm seeing this patient by the request  of:  Bradd Canary, MD  CC: Hip pain, elbow pain  [id number]   12/24/2020 Patient doing relatively well.  Does not want any type of injections.  Patient has responded to meloxicam previously.  Doing relatively well and follow-up with me again in 2 months  Update 02/25/2021 Jenna Johnson is a 65 y.o. female coming in with complaint of left knee pain and right elbow pain. Patient states that she is having some improvement with elbow. No longer goes to PT due to cost. Still having weakness with large grip. Wearing counterforce strap daily.   Also notes lateral epicondyle pain in left elbow. Notes a knot in L wrist for past 3 weeks.   Also having L hip when she lies on her side. Pain over greater trochanter. Has started back in the pool.   Pain with L knee with hip external rotation. Wants to know if pain is sciatic related. Does have pain in L glute.        Past Medical History:  Diagnosis Date  . Anemia    long time ago per pt   . Arthritis of knee, right    and left knee   . Back pain   . Bursitis of both hips   . Cataract    forming  right eye she thinks   . Dermatitis 09/18/2013  . Endometriosis   . GERD (gastroesophageal reflux disease)    changed diet and better   . H/O atrial septal defect   . Hypertension   . IBS (irritable bowel syndrome)   . Lactose intolerance   . Myalgia and myositis 09/18/2013  . Osteopenia    mild   . TIA (transient  ischemic attack) 2005   Past Surgical History:  Procedure Laterality Date  . ABDOMINAL HYSTERECTOMY  65 yrs old   still has one ovary  . APPENDECTOMY  65 yrs old  . ASD REPAIR  2006  . COLONOSCOPY     10 yr in [place]   . hole in heart    . RIGHT OOPHORECTOMY     ruptured ovarian cyst at age 83  . tia  surgery 2005   Social History   Socioeconomic History  . Marital status: Married    Spouse name: Beverlyn Mcginness  . Number of children: 0  . Years of education: Not on file  . Highest education level: Not on file  Occupational History  . Occupation: Retired  Tobacco Use  . Smoking status: Never Smoker  . Smokeless tobacco: Never Used  Substance and Sexual Activity  . Alcohol use: Yes    Comment: occasionally  . Drug use: No  . Sexual activity: Yes    Partners: Male  Other Topics Concern  . Not on file  Social History Narrative  . Not on file   Social Determinants of Health   Financial Resource Strain: Not on file  Food Insecurity: Not on file  Transportation Needs: Not on file  Physical Activity: Not on file  Stress: Not  on file  Social Connections: Not on file   Allergies  Allergen Reactions  . Codeine    Family History  Problem Relation Age of Onset  . Multiple sclerosis Mother   . Other Mother        blue tumor, air embolism,  . Hypertension Mother   . Obesity Mother   . Diabetes Father 68       type 2  . Other Father        fatty liver, lactose intolerant  . Thyroid disease Father   . Colon polyps Father   . Cancer Paternal Grandmother 62       breast  . Diabetes Paternal Grandmother   . Breast cancer Paternal Grandmother   . Other Sister        s/p hysterectomy  . Other Sister        s/p hysterectomy  . Colon cancer Neg Hx   . Esophageal cancer Neg Hx   . Stomach cancer Neg Hx   . Rectal cancer Neg Hx     Current Outpatient Medications (Endocrine & Metabolic):  .  predniSONE (DELTASONE) 10 MG tablet, 4 tab po day 1, 3 tab po day 2, 2 tab  po day 3 and 1 tab po day 4.  Current Outpatient Medications (Cardiovascular):  .  amLODipine (NORVASC) 2.5 MG tablet, Take 1 tablet (2.5 mg total) by mouth daily.  Current Outpatient Medications (Respiratory):  .  fluticasone (FLONASE) 50 MCG/ACT nasal spray, Place 2 sprays into both nostrils daily. Marland Kitchen  levocetirizine (XYZAL) 5 MG tablet, Take 1 tablet (5 mg total) by mouth every evening.  Current Outpatient Medications (Analgesics):  .  acetaminophen (TYLENOL) 650 MG CR tablet, Take 650 mg by mouth every 8 (eight) hours as needed for pain. Marland Kitchen  aspirin EC 81 MG tablet, Take 1 tablet (81 mg total) by mouth daily. .  meloxicam (MOBIC) 7.5 MG tablet, TAKE 1 TABLET(7.5 MG) BY MOUTH DAILY   Current Outpatient Medications (Other):  .  cefdinir (OMNICEF) 300 MG capsule, Take 1 capsule (300 mg total) by mouth 2 (two) times daily. .  cholecalciferol (VITAMIN D3) 25 MCG (1000 UT) tablet, Take 2,000 Units by mouth daily. Patient taking 3x/week   Reviewed prior external information including notes and imaging from  primary care provider As well as notes that were available from care everywhere and other healthcare systems.  Past medical history, social, surgical and family history all reviewed in electronic medical record.  No pertanent information unless stated regarding to the chief complaint.   Review of Systems:  No headache, visual changes, nausea, vomiting, diarrhea, constipation, dizziness, abdominal pain, skin rash, fevers, chills, night sweats, weight loss, swollen lymph nodes, body aches, joint swelling, chest pain, shortness of breath, mood changes. POSITIVE muscle aches  Objective  Blood pressure 124/72, pulse 72, height 5\' 1"  (1.549 m), weight 159 lb (72.1 kg), SpO2 97 %.   General: No apparent distress alert and oriented x3 mood and affect normal, dressed appropriately.  HEENT: Pupils equal, extraocular movements intact  Respiratory: Patient's speak in full sentences and does not  appear short of breath  Cardiovascular: No lower extremity edema, non tender, no erythema  Gait mild antalgic gait Left hip does have tenderness over the greater trochanteric area.  Good range of motion no noted otherwise.  Patient does have very mild tightness of the gluteal area left greater than right as well.  Negative straight leg test. Right elbow exam still has some mild pain over  the lateral epicondylar region.  Patient is wearing a counterforce brace.  Good grip strength noted. Contralateral elbow has some very mild tenderness more over the mid substance of the forearm ventrally over the lateral epicondylar region.  Good grip strength noted  [id number]; 15 additional minutes spent for Therapeutic exercises as stated in above notes.  This included exercises focusing on stretching, strengthening, with significant focus on eccentric aspects.   Long term goals include an improvement in range of motion, strength, endurance as well as avoiding reinjury. Patient's frequency would include in 1-2 times a day, 3-5 times a week for a duration of 6-12 weeks. Hip strengthening exercises which included:  Pelvic tilt/bracing to help with proper recruitment of the lower abs and pelvic floor muscles  Glute strengthening to properly contract glutes without over-engaging low back and hamstrings - prone hip extension and glute bridge exercises Proper stretching techniques to increase effectiveness for the hip flexors, groin, quads, piriformic and low back when appropriate     Proper technique shown and discussed handout in great detail with ATC.  All questions were discussed and answered.     Impression and Recommendations:     The above documentation has been reviewed and is accurate and complete Judi Saa, DO

## 2021-02-25 ENCOUNTER — Other Ambulatory Visit: Payer: Self-pay

## 2021-02-25 ENCOUNTER — Encounter: Payer: Self-pay | Admitting: Family Medicine

## 2021-02-25 ENCOUNTER — Ambulatory Visit (INDEPENDENT_AMBULATORY_CARE_PROVIDER_SITE_OTHER): Payer: 59 | Admitting: Family Medicine

## 2021-02-25 DIAGNOSIS — M7061 Trochanteric bursitis, right hip: Secondary | ICD-10-CM | POA: Diagnosis not present

## 2021-02-25 DIAGNOSIS — M7711 Lateral epicondylitis, right elbow: Secondary | ICD-10-CM

## 2021-02-25 DIAGNOSIS — M7062 Trochanteric bursitis, left hip: Secondary | ICD-10-CM | POA: Diagnosis not present

## 2021-02-25 NOTE — Patient Instructions (Signed)
Exercises GT Ice 20 min 2x a day If continue to have pain in 2 months will consider injection See me in 2 months

## 2021-02-25 NOTE — Assessment & Plan Note (Signed)
Patient is having a recurrent chronic problem.  Discussed which activities to do which wants to avoid.  Discussed icing regimen and home exercises.  Worsening pain will consider injection at follow-up.

## 2021-02-25 NOTE — Assessment & Plan Note (Signed)
Continue to give her trouble and patient is having some of the same symptoms on the contralateral side.  Seems to be more radial nerve on the contralateral side.  Discussed avoiding certain lifting mechanics.  Patient will likely will continue to improve if patient continues to do the exercises.  Patient will increase activity slowly and follow-up with me again in 4 to 8 weeks

## 2021-03-04 ENCOUNTER — Encounter: Payer: Self-pay | Admitting: Medical

## 2021-03-05 ENCOUNTER — Ambulatory Visit: Payer: 59 | Admitting: Medical

## 2021-03-05 ENCOUNTER — Other Ambulatory Visit: Payer: Self-pay

## 2021-03-05 VITALS — BP 118/76 | HR 63 | Temp 97.7°F | Resp 18 | Ht 61.0 in | Wt 159.8 lb

## 2021-03-05 DIAGNOSIS — R519 Headache, unspecified: Secondary | ICD-10-CM

## 2021-03-05 DIAGNOSIS — J3489 Other specified disorders of nose and nasal sinuses: Secondary | ICD-10-CM

## 2021-03-05 DIAGNOSIS — H6982 Other specified disorders of Eustachian tube, left ear: Secondary | ICD-10-CM | POA: Diagnosis not present

## 2021-03-05 MED ORDER — AZELASTINE HCL 0.1 % NA SOLN: 2.0000 | Freq: Two times a day (BID) | NASAL | 1 refills | Status: DC

## 2021-03-05 NOTE — Patient Instructions (Signed)
For eustachian tube dysfunction with probable allergic rhinitis, want ou to continue flonase, add astelin and continue xyzal.  With mild temporal area pain will get sed rate.   Went ahead and referred to ENT due to chronic nature of illness. If sinus pain returns would rx additional antibiotic.  Follow up 10-14 days or as needed

## 2021-03-05 NOTE — Addendum Note (Signed)
Addended by: Thelma Barge D on: 03/05/2021 12:07 PM   Modules accepted: Orders

## 2021-03-05 NOTE — Progress Notes (Signed)
Subjective:    Patient ID: Jenna Johnson, female    DOB: 1956-09-25, 65 y.o.   MRN: [id number]  HPI  Pt in for some recurrent persistent left ear pressure sensation. She did ok while she was in [place].   She states when landed in [place] she states ear pressure came back.   Below was A/P on last visit.  "You do have history of chronic intermittent ear pressure with some frontal sinus area discomfort.  Suspect that you had some eustachian tube dysfunction and possible sinus infection.  Suspects allergic rhinitis component.  No wax present on exam tympanic membranes/eardrums do not look infected.  For possible sinus infection prescribe cefdinir antibiotic.  For allergies prescribe Xyzal antihistamine, continue Flonase and making 4-day taper dose of prednisone available in light of your upcoming trip/ flights this weekend.  Would also recommend using Afrin spray 30 minutes prior to flying."  Asked that you update me by MyChart how you doing Tuesday morning.  If symptoms persist or worsening then would refer to allergist or ENT.  Pt tells me she did not use afrin before the flight.     Review of Systems  Constitutional: Negative for chills, diaphoresis and fatigue.  HENT: Positive for congestion and sinus pressure. Negative for trouble swallowing.   Respiratory: Negative for cough, chest tightness, shortness of breath and wheezing.   Cardiovascular: Negative for chest pain and palpitations.  Gastrointestinal: Negative for abdominal pain, diarrhea, rectal pain and vomiting.  Genitourinary: Negative for dysuria.  Musculoskeletal: Negative for back pain and joint swelling.  Skin: Negative for rash.  Neurological: Negative for dizziness, speech difficulty, weakness and headaches.  Psychiatric/Behavioral: Negative for behavioral problems, dysphoric mood and suicidal ideas. The patient is not nervous/anxious.     Past Medical History:  Diagnosis Date  . Anemia    long time  ago per pt   . Arthritis of knee, right    and left knee   . Back pain   . Bursitis of both hips   . Cataract    forming  right eye she thinks   . Dermatitis 09/18/2013  . Endometriosis   . GERD (gastroesophageal reflux disease)    changed diet and better   . H/O atrial septal defect   . Hypertension   . IBS (irritable bowel syndrome)   . Lactose intolerance   . Myalgia and myositis 09/18/2013  . Osteopenia    mild   . TIA (transient ischemic attack) 2005     Social History   Socioeconomic History  . Marital status: Married    Spouse name: Deserai Cansler  . Number of children: 0  . Years of education: Not on file  . Highest education level: Not on file  Occupational History  . Occupation: Retired  Tobacco Use  . Smoking status: Never Smoker  . Smokeless tobacco: Never Used  Substance and Sexual Activity  . Alcohol use: Yes    Comment: occasionally  . Drug use: No  . Sexual activity: Yes    Partners: Male  Other Topics Concern  . Not on file  Social History Narrative  . Not on file   Social Determinants of Health   Financial Resource Strain: Not on file  Food Insecurity: Not on file  Transportation Needs: Not on file  Physical Activity: Not on file  Stress: Not on file  Social Connections: Not on file  Intimate Partner Violence: Not on file    Past Surgical History:  Procedure Laterality Date  .  ABDOMINAL HYSTERECTOMY  65 yrs old   still has one ovary  . APPENDECTOMY  65 yrs old  . ASD REPAIR  2006  . COLONOSCOPY     10 yr in [place]   . hole in heart    . RIGHT OOPHORECTOMY     ruptured ovarian cyst at age 75  . tia  surgery 2005    Family History  Problem Relation Age of Onset  . Multiple sclerosis Mother   . Other Mother        blue tumor, air embolism,  . Hypertension Mother   . Obesity Mother   . Diabetes Father 67       type 2  . Other Father        fatty liver, lactose intolerant  . Thyroid disease Father   . Colon polyps Father   .  Cancer Paternal Grandmother 81       breast  . Diabetes Paternal Grandmother   . Breast cancer Paternal Grandmother   . Other Sister        s/p hysterectomy  . Other Sister        s/p hysterectomy  . Colon cancer Neg Hx   . Esophageal cancer Neg Hx   . Stomach cancer Neg Hx   . Rectal cancer Neg Hx     Allergies  Allergen Reactions  . Codeine     Current Outpatient Medications on File Prior to Visit  Medication Sig Dispense Refill  . acetaminophen (TYLENOL) 650 MG CR tablet Take 650 mg by mouth every 8 (eight) hours as needed for pain.    Marland Kitchen amLODipine (NORVASC) 2.5 MG tablet Take 1 tablet (2.5 mg total) by mouth daily. 90 tablet 1  . aspirin EC 81 MG tablet Take 1 tablet (81 mg total) by mouth daily.    . cefdinir (OMNICEF) 300 MG capsule Take 1 capsule (300 mg total) by mouth 2 (two) times daily. 20 capsule 0  . cholecalciferol (VITAMIN D3) 25 MCG (1000 UT) tablet Take 2,000 Units by mouth daily. Patient taking 3x/week    . fluticasone (FLONASE) 50 MCG/ACT nasal spray Place 2 sprays into both nostrils daily. 16 g 6  . levocetirizine (XYZAL) 5 MG tablet Take 1 tablet (5 mg total) by mouth every evening. 30 tablet 3  . meloxicam (MOBIC) 7.5 MG tablet TAKE 1 TABLET(7.5 MG) BY MOUTH DAILY 90 tablet 0   No current facility-administered medications on file prior to visit.    BP 118/76   Pulse 63   Temp 97.7 F (36.5 C)   Resp 18   Ht 5\' 1"  (1.549 m)   Wt 159 lb 12.8 oz (72.5 kg)   SpO2 100%   BMI 30.19 kg/m     Objective:   Physical Exam   General Mental Status- Alert. General Appearance- Not in acute distress.   Skin General: Color- Normal Color. Moisture- Normal Moisture.  Neck Carotid Arteries- Normal color. Moisture- Normal Moisture. No carotid bruits. No JVD.  Chest and Lung Exam Auscultation: Breath Sounds:-Normal.  Cardiovascular Auscultation:Rythm- Regular. Murmurs & Other Heart Sounds:Auscultation of the heart reveals- No  Murmurs.  Abdomen Inspection:-Inspeection Normal. Palpation/Percussion:Note:No mass. Palpation and Percussion of the abdomen reveal- Non Tender, Non Distended + BS, no rebound or guarding.   Neurologic Cranial Nerve exam:- CN III-XII intact(No nystagmus), symmetric smile. etric strength both upper and lower extremities.  heent-no boggy turbinates. Faint sinus pressure. Ear canals are clear. Normal tm. Faint left temporal area tenderness.  Left temproal  area faint tenderness.        Assessment & Plan:  For eustachian tube dysfunction with probable allergic rhinitis, want ou to continue flonase, add astelin and continue xyzal.  With mild temporal area pain will get sed rate.   Went ahead and referred to ENT due to chronic nature of illness. If sinus pain returns would rx additional antibiotic.  Follow up 10-14 days or as needed  Vaccine counseling.   Time spent with patient today was  30  minutes which consisted of chart revdiew, discussing diagnosis, work up treatment and documentation.

## 2021-03-05 NOTE — Addendum Note (Signed)
Addended by: Thelma Barge D on: 03/05/2021 12:02 PM   Modules accepted: Orders

## 2021-03-08 ENCOUNTER — Other Ambulatory Visit (INDEPENDENT_AMBULATORY_CARE_PROVIDER_SITE_OTHER): Payer: 59

## 2021-03-08 ENCOUNTER — Other Ambulatory Visit: Payer: Self-pay

## 2021-03-08 DIAGNOSIS — R519 Headache, unspecified: Secondary | ICD-10-CM

## 2021-03-09 ENCOUNTER — Other Ambulatory Visit: Payer: 59

## 2021-03-09 DIAGNOSIS — R519 Headache, unspecified: Secondary | ICD-10-CM

## 2021-03-09 LAB — SEDIMENTATION RATE: Sed Rate: 29 mm/hr (ref 0–30)

## 2021-03-09 NOTE — Progress Notes (Addendum)
No charge, difficult stick; redraw.

## 2021-03-09 NOTE — Addendum Note (Signed)
Addended by: Mervin Kung A on: 03/09/2021 09:22 AM   Modules accepted: Orders

## 2021-03-10 ENCOUNTER — Other Ambulatory Visit: Payer: 59

## 2021-03-15 ENCOUNTER — Encounter: Payer: Self-pay | Admitting: Medical

## 2021-03-17 ENCOUNTER — Other Ambulatory Visit: Payer: Self-pay

## 2021-03-17 ENCOUNTER — Ambulatory Visit: Payer: 59 | Admitting: Medical

## 2021-03-17 VITALS — BP 128/62 | HR 70 | Resp 20 | Ht 61.0 in | Wt 162.0 lb

## 2021-03-17 DIAGNOSIS — J309 Allergic rhinitis, unspecified: Secondary | ICD-10-CM

## 2021-03-17 DIAGNOSIS — H6982 Other specified disorders of Eustachian tube, left ear: Secondary | ICD-10-CM

## 2021-03-17 MED ORDER — METHYLPREDNISOLONE 4 MG PO TABS
ORAL_TABLET | ORAL | 0 refills | Status: DC
Start: 2021-03-17 — End: 2021-06-03

## 2021-03-17 NOTE — Progress Notes (Signed)
Subjective:    Patient ID: Jenna Johnson, female    DOB: 08/15/1956, 65 y.o.   MRN: [id number]  HPI  Pt is still having blocked sensation to her ear. Pt is using both flonase, astelin and xyzal. Minimal improvement. Basically the same.  Pt did get call from ENT. Appointment Apr 05, 2021.   Pt is going out of town until 03-27-2021. Will be flying and going to [employer].       Review of Systems  Constitutional: Negative for chills.  HENT: Positive for congestion and ear pain. Negative for ear discharge, mouth sores, nosebleeds and postnasal drip.        Ear pressure.  Respiratory: Negative for cough, choking, shortness of breath and wheezing.   Cardiovascular: Negative for chest pain and palpitations.  Gastrointestinal: Negative for abdominal pain.  Genitourinary: Negative for dysuria.  Musculoskeletal: Negative for back pain.  Neurological: Negative for dizziness, syncope, numbness and headaches.  Hematological: Negative for adenopathy. Does not bruise/bleed easily.  Psychiatric/Behavioral: Negative for behavioral problems and confusion.     Past Medical History:  Diagnosis Date  . Anemia    long time ago per pt   . Arthritis of knee, right    and left knee   . Back pain   . Bursitis of both hips   . Cataract    forming  right eye she thinks   . Dermatitis 09/18/2013  . Endometriosis   . GERD (gastroesophageal reflux disease)    changed diet and better   . H/O atrial septal defect   . Hypertension   . IBS (irritable bowel syndrome)   . Lactose intolerance   . Myalgia and myositis 09/18/2013  . Osteopenia    mild   . TIA (transient ischemic attack) 2005     Social History   Socioeconomic History  . Marital status: Married    Spouse name: Nannette Zill  . Number of children: 0  . Years of education: Not on file  . Highest education level: Not on file  Occupational History  . Occupation: Retired  Tobacco Use  . Smoking status: Never Smoker  .  Smokeless tobacco: Never Used  Substance and Sexual Activity  . Alcohol use: Yes    Comment: occasionally  . Drug use: No  . Sexual activity: Yes    Partners: Male  Other Topics Concern  . Not on file  Social History Narrative  . Not on file   Social Determinants of Health   Financial Resource Strain: Not on file  Food Insecurity: Not on file  Transportation Needs: Not on file  Physical Activity: Not on file  Stress: Not on file  Social Connections: Not on file  Intimate Partner Violence: Not on file    Past Surgical History:  Procedure Laterality Date  . ABDOMINAL HYSTERECTOMY  65 yrs old   still has one ovary  . APPENDECTOMY  65 yrs old  . ASD REPAIR  2006  . COLONOSCOPY     10 yr in [place]   . hole in heart    . RIGHT OOPHORECTOMY     ruptured ovarian cyst at age 68  . tia  surgery 2005    Family History  Problem Relation Age of Onset  . Multiple sclerosis Mother   . Other Mother        blue tumor, air embolism,  . Hypertension Mother   . Obesity Mother   . Diabetes Father 58  type 2  . Other Father        fatty liver, lactose intolerant  . Thyroid disease Father   . Colon polyps Father   . Cancer Paternal Grandmother 28       breast  . Diabetes Paternal Grandmother   . Breast cancer Paternal Grandmother   . Other Sister        s/p hysterectomy  . Other Sister        s/p hysterectomy  . Colon cancer Neg Hx   . Esophageal cancer Neg Hx   . Stomach cancer Neg Hx   . Rectal cancer Neg Hx     Allergies  Allergen Reactions  . Codeine     Current Outpatient Medications on File Prior to Visit  Medication Sig Dispense Refill  . acetaminophen (TYLENOL) 650 MG CR tablet Take 650 mg by mouth every 8 (eight) hours as needed for pain.    Marland Kitchen amLODipine (NORVASC) 2.5 MG tablet Take 1 tablet (2.5 mg total) by mouth daily. 90 tablet 1  . aspirin EC 81 MG tablet Take 1 tablet (81 mg total) by mouth daily.    Marland Kitchen azelastine (ASTELIN) 0.1 % nasal spray Place  2 sprays into both nostrils 2 (two) times daily. Use in each nostril as directed 30 mL 1  . cholecalciferol (VITAMIN D3) 25 MCG (1000 UT) tablet Take 2,000 Units by mouth daily. Patient taking 3x/week    . fluticasone (FLONASE) 50 MCG/ACT nasal spray Place 2 sprays into both nostrils daily. 16 g 6  . levocetirizine (XYZAL) 5 MG tablet Take 1 tablet (5 mg total) by mouth every evening. 30 tablet 3  . meloxicam (MOBIC) 7.5 MG tablet TAKE 1 TABLET(7.5 MG) BY MOUTH DAILY 90 tablet 0   No current facility-administered medications on file prior to visit.    BP 128/62   Pulse 70   Resp 20   Ht 5\' 1"  (1.549 m)   Wt 162 lb (73.5 kg)   SpO2 99%   BMI 30.61 kg/m       Objective:   Physical Exam  General Mental Status- Alert. General Appearance- Not in acute distress.   Skin General: Color- Normal Color. Moisture- Normal Moisture.  Chest and Lung Exam Auscultation: Breath Sounds:-Normal.  Cardiovascular Auscultation:Rythm- Regular. Murmurs & Other Heart Sounds:Auscultation of the heart reveals- No Murmurs.  Abdomen Inspection:-Inspeection Normal. Palpation/Percussion:Note:No mass. Palpation and Percussion of the abdomen reveal- Non Tender, Non Distended + BS, no rebound or guarding.   Neurologic Cranial Nerve exam:- CN III-XII intact(No nystagmus), symmetric smile. Strength:- 5/5 equal and symmetric strength both upper and lower extremities.  heent- no sinus pressure. Canals clear. Tm are normal.    Assessment & Plan:  For probable eustachian tube dysfunction persistent and recurrent continue xyzal, flonase, astelin and will add 6 day medrol taper.  You can also use afrin spray 30 minutes before upcoming flight to and from [place].  Call ent and see if can get on cancellaton list.  Follow up with [place] as needed/ and as scheduled with pcp.

## 2021-03-17 NOTE — Patient Instructions (Addendum)
For probable eustachian tube dysfunction(likely allergy releated) persistent and recurrent continue xyzal, flonase, astelin and will add 6 day medrol taper.  You can also use afrin spray 30 minutes before upcoming flight to and from [place].  Call ent and see if can get on cancellaton list.  Follow up with [place] as needed/ and as scheduled with pcp.

## 2021-04-05 ENCOUNTER — Encounter (INDEPENDENT_AMBULATORY_CARE_PROVIDER_SITE_OTHER): Payer: Self-pay | Admitting: Otolaryngology

## 2021-04-05 ENCOUNTER — Ambulatory Visit (INDEPENDENT_AMBULATORY_CARE_PROVIDER_SITE_OTHER): Payer: 59 | Admitting: Otolaryngology

## 2021-04-05 ENCOUNTER — Other Ambulatory Visit: Payer: Self-pay

## 2021-04-05 VITALS — Temp 97.2°F

## 2021-04-05 DIAGNOSIS — J310 Chronic rhinitis: Secondary | ICD-10-CM | POA: Diagnosis not present

## 2021-04-05 DIAGNOSIS — M26609 Unspecified temporomandibular joint disorder, unspecified side: Secondary | ICD-10-CM

## 2021-04-05 NOTE — Progress Notes (Signed)
HPI: Jenna Johnson is a 65 y.o. female who presents is referred by Esperanza Richters, PA for evaluation of ear complaints.  She feels like she has a clogged left ear as well as drainage from her ear.  She feels like the drainage goes down her neck or throat more on the left side than the right.  She has also noticed it on the right side.  She has not had any hearing problems.  She has had no drainage from the ear canals.  She also complains of postnasal drainage and some sinus congestion.  She has been on Flonase and Astelin as well as 2 rounds of antibiotics.  She has history of allergies and is also used Chlortab.  This apparently started back in November..  Past Medical History:  Diagnosis Date  . Anemia    long time ago per pt   . Arthritis of knee, right    and left knee   . Back pain   . Bursitis of both hips   . Cataract    forming  right eye she thinks   . Dermatitis 09/18/2013  . Endometriosis   . GERD (gastroesophageal reflux disease)    changed diet and better   . H/O atrial septal defect   . Hypertension   . IBS (irritable bowel syndrome)   . Lactose intolerance   . Myalgia and myositis 09/18/2013  . Osteopenia    mild   . TIA (transient ischemic attack) 2005   Past Surgical History:  Procedure Laterality Date  . ABDOMINAL HYSTERECTOMY  65 yrs old   still has one ovary  . APPENDECTOMY  65 yrs old  . ASD REPAIR  2006  . COLONOSCOPY     10 yr in [place]   . hole in heart    . RIGHT OOPHORECTOMY     ruptured ovarian cyst at age 50  . tia  surgery 2005   Social History   Socioeconomic History  . Marital status: Married    Spouse name: Eugene Zeiders  . Number of children: 0  . Years of education: Not on file  . Highest education level: Not on file  Occupational History  . Occupation: Retired  Tobacco Use  . Smoking status: Never Smoker  . Smokeless tobacco: Never Used  Substance and Sexual Activity  . Alcohol use: Yes    Comment: occasionally  . Drug  use: No  . Sexual activity: Yes    Partners: Male  Other Topics Concern  . Not on file  Social History Narrative  . Not on file   Social Determinants of Health   Financial Resource Strain: Not on file  Food Insecurity: Not on file  Transportation Needs: Not on file  Physical Activity: Not on file  Stress: Not on file  Social Connections: Not on file   Family History  Problem Relation Age of Onset  . Multiple sclerosis Mother   . Other Mother        blue tumor, air embolism,  . Hypertension Mother   . Obesity Mother   . Diabetes Father 24       type 2  . Other Father        fatty liver, lactose intolerant  . Thyroid disease Father   . Colon polyps Father   . Cancer Paternal Grandmother 30       breast  . Diabetes Paternal Grandmother   . Breast cancer Paternal Grandmother   . Other Sister  s/p hysterectomy  . Other Sister        s/p hysterectomy  . Colon cancer Neg Hx   . Esophageal cancer Neg Hx   . Stomach cancer Neg Hx   . Rectal cancer Neg Hx    Allergies  Allergen Reactions  . Codeine    Prior to Admission medications   Medication Sig Start Date End Date Taking? Authorizing Provider  acetaminophen (TYLENOL) 650 MG CR tablet Take 650 mg by mouth every 8 (eight) hours as needed for pain.    [provider]  amLODipine (NORVASC) 2.5 MG tablet Take 1 tablet (2.5 mg total) by mouth daily. 02/01/21   Whitmire, Thermon Leyland, FNP  aspirin EC 81 MG tablet Take 1 tablet (81 mg total) by mouth daily. 09/18/13   Bradd Canary, MD  azelastine (ASTELIN) 0.1 % nasal spray Place 2 sprays into both nostrils 2 (two) times daily. Use in each nostril as directed 03/05/21   Saguier, Ramon Dredge, PA-C  cholecalciferol (VITAMIN D3) 25 MCG (1000 UT) tablet Take 2,000 Units by mouth daily. Patient taking 3x/week    [provider]  fluticasone (FLONASE) 50 MCG/ACT nasal spray Place 2 sprays into both nostrils daily. 11/23/20   Donato Schultz, DO  levocetirizine  (XYZAL) 5 MG tablet Take 1 tablet (5 mg total) by mouth every evening. 02/23/21   Saguier, Ramon Dredge, PA-C  meloxicam (MOBIC) 7.5 MG tablet TAKE 1 TABLET(7.5 MG) BY MOUTH DAILY 01/18/21   Judi Saa, DO  methylPREDNISolone (MEDROL) 4 MG tablet Standard 6 day taper dose 03/17/21   Saguier, Ramon Dredge, PA-C     Positive ROS: Otherwise negative  All other systems have been reviewed and were otherwise negative with the exception of those mentioned in the HPI and as above.  Physical Exam: Constitutional: Alert, well-appearing, no acute distress Ears: External ears without lesions or tenderness. Ear canals are clear bilaterally.  On microscopic exam TMs are clear bilaterally with good mobility on pneumatic otoscopy.  No middle ear space abnormality noted.  On tuning fork testing AC > BC bilaterally with Weber midline.  Subjectively she heard well in both ears with the 1024 tuning fork. Nasal: External nose without lesions. Septum with minimal deformity and mild rhinitis.  After decongesting the nose both middle meatus regions were clear with no polyps and no mucopurulent discharge noted from the middle meatus on either side.. Clear nasal passages otherwise. Oral: Lips and gums without lesions. Tongue and palate mucosa without lesions. Posterior oropharynx clear.  Tonsil regions appear benign bilaterally.  Posterior oropharynx is clear with no significant postnasal drainage. Neck: No palpable adenopathy or masses.  Palpation of the submandibular and parotid region was benign bilaterally.  She feels like she notices the drainage from her ear going down into her throat and points to the left side of her throat.  But there is nothing to feel or visualize in this region.  Of note she does have obvious left TMJ dysfunction which apparently is treated by her dentist. Respiratory: Breathing comfortably  Skin: No facial/neck lesions or rash noted.  Procedures  Assessment: Chronic rhinitis. Normal TMs bilaterally  with normal eustachian tube function on clinical exam. Left TMJ dysfunction.  Plan: Reviewed with the patient as well as her husband concerning normal ears and eustachian tube function. Suspect some of this is probably allergy related or related to chronic rhinitis and will continue with the nasal spray Flonase as well as use of saline irrigation such as the Nettie pot  when she feels like she is having a lot of postnasal drainage or drainage from her ears. Suggested follow-up with her dentist concerning further treatment of her TMJ dysfunction.   Narda Bonds, MD   CC:

## 2021-04-25 ENCOUNTER — Other Ambulatory Visit: Payer: Self-pay | Admitting: Family Medicine

## 2021-04-28 NOTE — Progress Notes (Signed)
[name] [address] [place] [address] Phone: [phone] Subjective:   [name], am serving as a scribe for Dr. Antoine Primas. This visit occurred during the SARS-CoV-2 public health emergency.  Safety protocols were in place, including screening questions prior to the visit, additional usage of staff PPE, and extensive cleaning of exam room while observing appropriate contact time as indicated for disinfecting solutions.   I'm seeing this patient by the request  of:  Bradd Canary, MD  CC: Left leg pain  [id number]   12/24/2020 Patient doing relatively well.  Does not want any type of injections.  Patient has responded to meloxicam previously.  Doing relatively well and follow-up with me again in 2 months  Patient still having more of the lateral epicondylitis.  Patient does seem to be making some progress at this time though.  Patient is using a counterforce strap and seems to do better than the wrist brace.  We discussed potentially using a wrist brace at night.  Discussed with patient to continue the home exercises.  Patient will check with insurance to see if there is an approved physical therapy place that would not cost so much out-of-pocket and then we would continue other treatment.  Follow-up again 6 to 8 weeks.  Update 04/29/2021 Jenna Johnson is a 65 y.o. female coming in with complaint of R knee and R elbow pain. Patient states that her elbow pain has improved.   Is having L knee pain at night especially. Pain over patellar tendon.   Also continues to have lower back and hip pain. Unable to lying on L side due to pain in posterior aspect. Pain in only when she lies on that side. Feels like pain is moving from R to L. Using Meloxicam and Tylenol and Salonpas.      Past Medical History:  Diagnosis Date  . Anemia    long time ago per pt   . Arthritis of knee, right    and left knee   . Back pain   . Bursitis of  both hips   . Cataract    forming  right eye she thinks   . Dermatitis 09/18/2013  . Endometriosis   . GERD (gastroesophageal reflux disease)    changed diet and better   . H/O atrial septal defect   . Hypertension   . IBS (irritable bowel syndrome)   . Lactose intolerance   . Myalgia and myositis 09/18/2013  . Osteopenia    mild   . TIA (transient ischemic attack) 2005   Past Surgical History:  Procedure Laterality Date  . ABDOMINAL HYSTERECTOMY  65 yrs old   still has one ovary  . APPENDECTOMY  65 yrs old  . ASD REPAIR  2006  . COLONOSCOPY     10 yr in [place]   . hole in heart    . RIGHT OOPHORECTOMY     ruptured ovarian cyst at age 43  . tia  surgery 2005   Social History   Socioeconomic History  . Marital status: Married    Spouse name: Lyndzee Kliebert  . Number of children: 0  . Years of education: Not on file  . Highest education level: Not on file  Occupational History  . Occupation: Retired  Tobacco Use  . Smoking status: Never Smoker  . Smokeless tobacco: Never Used  Substance and Sexual Activity  . Alcohol use: Yes    Comment: occasionally  . Drug  use: No  . Sexual activity: Yes    Partners: Male  Other Topics Concern  . Not on file  Social History Narrative  . Not on file   Social Determinants of Health   Financial Resource Strain: Not on file  Food Insecurity: Not on file  Transportation Needs: Not on file  Physical Activity: Not on file  Stress: Not on file  Social Connections: Not on file   Allergies  Allergen Reactions  . Codeine    Family History  Problem Relation Age of Onset  . Multiple sclerosis Mother   . Other Mother        blue tumor, air embolism,  . Hypertension Mother   . Obesity Mother   . Diabetes Father 60       type 2  . Other Father        fatty liver, lactose intolerant  . Thyroid disease Father   . Colon polyps Father   . Cancer Paternal Grandmother 51       breast  . Diabetes Paternal Grandmother   . Breast  cancer Paternal Grandmother   . Other Sister        s/p hysterectomy  . Other Sister        s/p hysterectomy  . Colon cancer Neg Hx   . Esophageal cancer Neg Hx   . Stomach cancer Neg Hx   . Rectal cancer Neg Hx     Current Outpatient Medications (Endocrine & Metabolic):  .  methylPREDNISolone (MEDROL) 4 MG tablet, Standard 6 day taper dose  Current Outpatient Medications (Cardiovascular):  .  amLODipine (NORVASC) 2.5 MG tablet, Take 1 tablet (2.5 mg total) by mouth daily.  Current Outpatient Medications (Respiratory):  .  azelastine (ASTELIN) 0.1 % nasal spray, Place 2 sprays into both nostrils 2 (two) times daily. Use in each nostril as directed .  fluticasone (FLONASE) 50 MCG/ACT nasal spray, Place 2 sprays into both nostrils daily. Marland Kitchen  levocetirizine (XYZAL) 5 MG tablet, Take 1 tablet (5 mg total) by mouth every evening.  Current Outpatient Medications (Analgesics):  .  acetaminophen (TYLENOL) 650 MG CR tablet, Take 650 mg by mouth every 8 (eight) hours as needed for pain. Marland Kitchen  aspirin EC 81 MG tablet, Take 1 tablet (81 mg total) by mouth daily. .  meloxicam (MOBIC) 7.5 MG tablet, TAKE 1 TABLET(7.5 MG) BY MOUTH DAILY   Current Outpatient Medications (Other):  .  cholecalciferol (VITAMIN D3) 25 MCG (1000 UT) tablet, Take 2,000 Units by mouth daily. Patient taking 3x/week   Reviewed prior external information including notes and imaging from  primary care provider As well as notes that were available from care everywhere and other healthcare systems.  Past medical history, social, surgical and family history all reviewed in electronic medical record.  No pertanent information unless stated regarding to the chief complaint.   Review of Systems:  No headache, visual changes, nausea, vomiting, diarrhea, constipation, dizziness, abdominal pain, skin rash, fevers, chills, night sweats, weight loss, swollen lymph nodes, body aches, joint swelling, chest pain, shortness of breath, mood  changes. POSITIVE muscle aches  Objective  Blood pressure 118/72, pulse 77, height 5\' 1"  (1.549 m), weight 168 lb (76.2 kg), SpO2 95 %.   General: No apparent distress alert and oriented x3 mood and affect normal, dressed appropriately.  HEENT: Pupils equal, extraocular movements intact  Respiratory: Patient's speak in full sentences and does not appear short of breath  Cardiovascular: No lower extremity edema, non tender, no erythema  Gait normal with good balance and coordination.  MSK: Bilateral knees do have some lateral tracking of the patella noted.  Seems to be right greater than left.  Overall though the patient actually has more pain over the past anserine area in the distal hamstring.  No pain with compression of the calf or any pain in the popliteal area.  Seems to be more on the lateral aspect of the knee joint.  No swelling in the lower extremity noted.  Mild increase in discomfort with flexion of the knee.       Impression and Recommendations:     The above documentation has been reviewed and is accurate and complete Judi Saa, DO

## 2021-04-29 ENCOUNTER — Other Ambulatory Visit: Payer: Self-pay

## 2021-04-29 ENCOUNTER — Ambulatory Visit: Payer: 59 | Admitting: Family Medicine

## 2021-04-29 ENCOUNTER — Encounter: Payer: Self-pay | Admitting: Family Medicine

## 2021-04-29 DIAGNOSIS — M76892 Other specified enthesopathies of left lower limb, excluding foot: Secondary | ICD-10-CM

## 2021-04-29 NOTE — Patient Instructions (Signed)
Pes exercises Turmeric 500mg  daily watch for reflux and bruising Heel lift in shoes Check back in 6-8 weeks

## 2021-04-29 NOTE — Assessment & Plan Note (Addendum)
I believe the patient has more of a tendinitis and seems to be more on the distal aspect on the medial aspect.  Likely some mild underlying bursitis noted as well.  We will get x-rays of the knee to further evaluate.  Discussed diet compression, home exercises.  I believe that patient's abnormal gait is causing more the discomfort on the lateral aspect of the hip with more of a greater trochanteric bursitis.  Discussed potential injection which patient declined at the moment.  We will start with home exercises and see how patient responds.  Patient does have meloxicam if necessary.  Follow-up with me again 6 to 8 weeks  We did discuss if any type of swelling or any worsening pain that patient should seek medical attention immediately.  Felt that anything such as a blood clot would be low likelihood at this point but we did discuss with patient as well as significant other in the room agreed that no further work-up is necessary at this time.  We also did discuss that differential includes the potential for a lumbar radiculopathy or sciatica but seems to be more muscular in nature today.

## 2021-05-04 ENCOUNTER — Encounter (INDEPENDENT_AMBULATORY_CARE_PROVIDER_SITE_OTHER): Payer: Self-pay | Admitting: Family Medicine

## 2021-05-04 DIAGNOSIS — I10 Essential (primary) hypertension: Secondary | ICD-10-CM

## 2021-05-04 NOTE — Telephone Encounter (Signed)
Please review

## 2021-05-04 NOTE — Telephone Encounter (Signed)
Pt last seen by Dawn Whitmire, FNP.  

## 2021-05-05 MED ORDER — AMLODIPINE BESYLATE 2.5 MG PO TABS: 2.5000 mg | ORAL_TABLET | Freq: Every day | ORAL | 1 refills | Status: DC

## 2021-06-03 ENCOUNTER — Ambulatory Visit (INDEPENDENT_AMBULATORY_CARE_PROVIDER_SITE_OTHER): Payer: 59 | Admitting: Family Medicine

## 2021-06-03 ENCOUNTER — Other Ambulatory Visit: Payer: Self-pay

## 2021-06-03 ENCOUNTER — Encounter: Payer: 59 | Admitting: Family Medicine

## 2021-06-03 VITALS — BP 122/80 | HR 80 | Temp 98.0°F | Resp 16 | Ht 60.0 in | Wt 157.2 lb

## 2021-06-03 DIAGNOSIS — R7303 Prediabetes: Secondary | ICD-10-CM | POA: Diagnosis not present

## 2021-06-03 DIAGNOSIS — D508 Other iron deficiency anemias: Secondary | ICD-10-CM

## 2021-06-03 DIAGNOSIS — E559 Vitamin D deficiency, unspecified: Secondary | ICD-10-CM

## 2021-06-03 DIAGNOSIS — M8580 Other specified disorders of bone density and structure, unspecified site: Secondary | ICD-10-CM

## 2021-06-03 DIAGNOSIS — R7989 Other specified abnormal findings of blood chemistry: Secondary | ICD-10-CM

## 2021-06-03 DIAGNOSIS — M1711 Unilateral primary osteoarthritis, right knee: Secondary | ICD-10-CM

## 2021-06-03 DIAGNOSIS — Z0000 Encounter for general adult medical examination without abnormal findings: Secondary | ICD-10-CM | POA: Diagnosis not present

## 2021-06-03 DIAGNOSIS — E785 Hyperlipidemia, unspecified: Secondary | ICD-10-CM | POA: Diagnosis not present

## 2021-06-03 DIAGNOSIS — Z23 Encounter for immunization: Secondary | ICD-10-CM | POA: Diagnosis not present

## 2021-06-03 DIAGNOSIS — I10 Essential (primary) hypertension: Secondary | ICD-10-CM

## 2021-06-03 DIAGNOSIS — E2839 Other primary ovarian failure: Secondary | ICD-10-CM

## 2021-06-03 DIAGNOSIS — Z780 Asymptomatic menopausal state: Secondary | ICD-10-CM

## 2021-06-03 DIAGNOSIS — N951 Menopausal and female climacteric states: Secondary | ICD-10-CM

## 2021-06-03 LAB — CBC WITH DIFFERENTIAL/PLATELET
Basophils Absolute: 0 10*3/uL (ref 0.0–0.1)
Basophils Relative: 0.6 % (ref 0.0–3.0)
Eosinophils Absolute: 0.1 10*3/uL (ref 0.0–0.7)
Eosinophils Relative: 0.9 % (ref 0.0–5.0)
HCT: 40.3 % (ref 36.0–46.0)
Hemoglobin: 13.2 g/dL (ref 12.0–15.0)
Lymphocytes Relative: 29.8 % (ref 12.0–46.0)
Lymphs Abs: 1.9 10*3/uL (ref 0.7–4.0)
MCHC: 32.7 g/dL (ref 30.0–36.0)
MCV: 85.7 fl (ref 78.0–100.0)
Monocytes Absolute: 0.5 10*3/uL (ref 0.1–1.0)
Monocytes Relative: 7.9 % (ref 3.0–12.0)
Neutro Abs: 3.8 10*3/uL (ref 1.4–7.7)
Neutrophils Relative %: 60.8 % (ref 43.0–77.0)
Platelets: 176 10*3/uL (ref 150.0–400.0)
RBC: 4.7 Mil/uL (ref 3.87–5.11)
RDW: 13.7 % (ref 11.5–15.5)
WBC: 6.3 10*3/uL (ref 4.0–10.5)

## 2021-06-03 LAB — TSH: TSH: 0.82 u[IU]/mL (ref 0.35–5.50)

## 2021-06-03 LAB — COMPREHENSIVE METABOLIC PANEL
ALT: 19 U/L (ref 0–35)
AST: 22 U/L (ref 0–37)
Albumin: 4.4 g/dL (ref 3.5–5.2)
Alkaline Phosphatase: 64 U/L (ref 39–117)
BUN: 13 mg/dL (ref 6–23)
CO2: 27 mEq/L (ref 19–32)
Calcium: 9.3 mg/dL (ref 8.4–10.5)
Chloride: 105 mEq/L (ref 96–112)
Creatinine, Ser: 0.84 mg/dL (ref 0.40–1.20)
GFR: 74.12 mL/min (ref >60.00)
Glucose, Bld: 89 mg/dL (ref 70–99)
Potassium: 4.1 mEq/L (ref 3.5–5.1)
Sodium: 142 mEq/L (ref 135–145)
Total Bilirubin: 0.4 mg/dL (ref 0.2–1.2)
Total Protein: 7.3 g/dL (ref 6.0–8.3)

## 2021-06-03 LAB — LIPID PANEL
Cholesterol: 163 mg/dL (ref 0–200)
HDL: 65.2 mg/dL (ref >39.00)
LDL Cholesterol: 85 mg/dL (ref 0–99)
NonHDL: 98
Total CHOL/HDL Ratio: 3
Triglycerides: 67 mg/dL (ref 0.0–149.0)
VLDL: 13.4 mg/dL (ref 0.0–40.0)

## 2021-06-03 LAB — VITAMIN D 25 HYDROXY (VIT D DEFICIENCY, FRACTURES): VITD: 52.12 ng/mL (ref 30.00–100.00)

## 2021-06-03 LAB — HEMOGLOBIN A1C: Hgb A1c MFr Bld: 5.9 % (ref 4.6–6.5)

## 2021-06-03 LAB — PARATHYROID HORMONE, INTACT (NO CA): PTH: 45 pg/mL (ref 16–77)

## 2021-06-03 MED ORDER — MONTELUKAST SODIUM 10 MG PO TABS: 10.0000 mg | ORAL_TABLET | Freq: Every day | ORAL | 5 refills | Status: DC

## 2021-06-03 NOTE — Assessment & Plan Note (Signed)
Supplement and monitor 

## 2021-06-03 NOTE — Assessment & Plan Note (Signed)
hgba1c acceptable, minimize simple carbs. Increase exercise as tolerated.  

## 2021-06-03 NOTE — Assessment & Plan Note (Signed)
Increase leafy greens, consider increased lean red meat and using cast iron cookware. Continue to monitor, report any concerns 

## 2021-06-03 NOTE — Assessment & Plan Note (Addendum)
Patient encouraged to maintain heart healthy diet, regular exercise, adequate sleep. Consider daily probiotics. Take medications as prescribed 

## 2021-06-03 NOTE — Assessment & Plan Note (Signed)
Encourage heart healthy diet such as MIND or DASH diet, increase exercise, avoid trans fats, simple carbohydrates and processed foods, consider a krill or fish or flaxseed oil cap daily.  °

## 2021-06-03 NOTE — Assessment & Plan Note (Signed)
Well controlled, no changes to meds. Encouraged heart healthy diet such as the DASH diet and exercise as tolerated.  °

## 2021-06-03 NOTE — Patient Instructions (Addendum)
Flonase daily  Plus one of the following  Cetirizine/Zyrtec 10 mg or Fexofenadine/Allegra 180 mg or Xyzal once to twice a day (antihistamine)  Then will send in Singulair/Montelukast 10 mg to take once daily  Nasal saline as needed  Preventive Care 53-63 Years Old, Female Preventive care refers to lifestyle choices and visits with your health care provider that can promote health and wellness. This includes: A yearly physical exam. This is also called an annual wellness visit. Regular dental and eye exams. Immunizations. Screening for certain conditions. Healthy lifestyle choices, such as: Eating a healthy diet. Getting regular exercise. Not using drugs or products that contain nicotine and tobacco. Limiting alcohol use. What can I expect for my preventive care visit? Physical exam Your health care provider will check your: Height and weight. These may be used to calculate your BMI (body mass index). BMI is a measurement that tells if you are at a healthy weight. Heart rate and blood pressure. Body temperature. Skin for abnormal spots. Counseling Your health care provider may ask you questions about your: Past medical problems. Family's medical history. Alcohol, tobacco, and drug use. Emotional well-being. Home life and relationship well-being. Sexual activity. Diet, exercise, and sleep habits. Work and work Statistician. Access to firearms. Method of birth control. Menstrual cycle. Pregnancy history. What immunizations do I need?  Vaccines are usually given at various ages, according to a schedule. Your health care provider will recommend vaccines for you based on your age, medicalhistory, and lifestyle or other factors, such as travel or where you work. What tests do I need? Blood tests Lipid and cholesterol levels. These may be checked every 5 years, or more often if you are over 48 years old. Hepatitis C test. Hepatitis B test. Screening Lung cancer screening.  You may have this screening every year starting at age 40 if you have a 30-pack-year history of smoking and currently smoke or have quit within the past 15 years. Colorectal cancer screening. All adults should have this screening starting at age 88 and continuing until age 70. Your health care provider may recommend screening at age 39 if you are at increased risk. You will have tests every 1-10 years, depending on your results and the type of screening test. Diabetes screening. This is done by checking your blood sugar (glucose) after you have not eaten for a while (fasting). You may have this done every 1-3 years. Mammogram. This may be done every 1-2 years. Talk with your health care provider about when you should start having regular mammograms. This may depend on whether you have a family history of breast cancer. BRCA-related cancer screening. This may be done if you have a family history of breast, ovarian, tubal, or peritoneal cancers. Pelvic exam and Pap test. This may be done every 3 years starting at age 77. Starting at age 14, this may be done every 5 years if you have a Pap test in combination with an HPV test. Other tests STD (sexually transmitted disease) testing, if you are at risk. Bone density scan. This is done to screen for osteoporosis. You may have this scan if you are at high risk for osteoporosis. Talk with your health care provider about your test results, treatment options,and if necessary, the need for more tests. Follow these instructions at home: Eating and drinking  Eat a diet that includes fresh fruits and vegetables, whole grains, lean protein, and low-fat dairy products. Take vitamin and mineral supplements as recommended by your health care provider.  Do not drink alcohol if: Your health care provider tells you not to drink. You are pregnant, may be pregnant, or are planning to become pregnant. If you drink alcohol: Limit how much you have to 0-1 drink a  day. Be aware of how much alcohol is in your drink. In the [place] one drink equals one 12 oz bottle of beer (355 mL), one 5 oz glass of wine (148 mL), or one 1 oz glass of hard liquor (44 mL).  Lifestyle Take daily care of your teeth and gums. Brush your teeth every morning and night with fluoride toothpaste. Floss one time each day. Stay active. Exercise for at least 30 minutes 5 or more days each week. Do not use any products that contain nicotine or tobacco, such as cigarettes, e-cigarettes, and chewing tobacco. If you need help quitting, ask your health care provider. Do not use drugs. If you are sexually active, practice safe sex. Use a condom or other form of protection to prevent STIs (sexually transmitted infections). If you do not wish to become pregnant, use a form of birth control. If you plan to become pregnant, see your health care provider for a prepregnancy visit. If told by your health care provider, take low-dose aspirin daily starting at age 27. Find healthy ways to cope with stress, such as: Meditation, yoga, or listening to music. Journaling. Talking to a trusted person. Spending time with friends and family. Safety Always wear your seat belt while driving or riding in a vehicle. Do not drive: If you have been drinking alcohol. Do not ride with someone who has been drinking. When you are tired or distracted. While texting. Wear a helmet and other protective equipment during sports activities. If you have firearms in your house, make sure you follow all gun safety procedures. What's next? Visit your health care provider once a year for an annual wellness visit. Ask your health care provider how often you should have your eyes and teeth checked. Stay up to date on all vaccines. This information is not intended to replace advice given to you by your health care provider. Make sure you discuss any questions you have with your healthcare provider. Document Revised:  08/25/2020 Document Reviewed: 08/02/2018 Elsevier Patient Education  2022 [employer].

## 2021-06-03 NOTE — Progress Notes (Signed)
Patient ID: Jenna Johnson, female    DOB: 28-Apr-1956  Age: 65 y.o. MRN: [id number]    Subjective:  Subjective  HPI SHADARA LOPEZ presents for office visit today for comprehensive physical exam today and follow up on management of chronic concerns. She states that she did not have any recent hospitalization or recent ER visits to report. She reports feeling sinus pressure that has been bothering her for over a year now. She states that she tried Flonase, Chlortab which has helped provide her some temporary relief. She states that she takes Azelastine only for 3 days then stops. She states that she tried Chlorthalidone, but it did not help. She denies CP/palp/SOB/HA/congestion/fevers/GI or GU c/o. Taking meds as prescribed.   She reports that her knee pain has been bothering her and states that she took a muscle relaxer once or twice only when the pain gets bad enough. She reports getting treated for overuse of right wrist from doing yard work.   Review of Systems  Constitutional:  Negative for chills, fatigue and fever.  HENT:  Positive for sinus pressure. Negative for congestion, rhinorrhea, sinus pain and sore throat.   Eyes:  Negative for pain.  Respiratory:  Negative for cough and shortness of breath.   Cardiovascular:  Negative for chest pain, palpitations and leg swelling.  Gastrointestinal:  Negative for abdominal pain, blood in stool, diarrhea, nausea and vomiting.  Genitourinary:  Negative for decreased urine volume, flank pain, frequency, vaginal bleeding and vaginal discharge.  Musculoskeletal:  Positive for arthralgias. Negative for back pain.  Neurological:  Negative for headaches.   History Past Medical History:  Diagnosis Date   Anemia    long time ago per pt    Arthritis of knee, right    and left knee    Back pain    Bursitis of both hips    Cataract    forming  right eye she thinks    Dermatitis 09/18/2013   Endometriosis    GERD (gastroesophageal  reflux disease)    changed diet and better    H/O atrial septal defect    Hypertension    IBS (irritable bowel syndrome)    Lactose intolerance    Myalgia and myositis 09/18/2013   Osteopenia    mild    TIA (transient ischemic attack) 2005    She has a past surgical history that includes Appendectomy (65 yrs old); tia (surgery 2005); hole in heart; ASD repair (2006); Abdominal hysterectomy (65 yrs old); Right oophorectomy; and Colonoscopy.   Her family history includes Breast cancer in her paternal grandmother; Cancer (age of onset: 62) in her paternal grandmother; Colon polyps in her father; Diabetes in her paternal grandmother; Diabetes (age of onset: 47) in her father; Hypertension in her mother; Multiple sclerosis in her mother; Obesity in her mother; Other in her father, mother, sister, and sister; Thyroid disease in her father.She reports that she has never smoked. She has never used smokeless tobacco. She reports current alcohol use. She reports that she does not use drugs.  Current Outpatient Medications on File Prior to Visit  Medication Sig Dispense Refill   acetaminophen (TYLENOL) 650 MG CR tablet Take 650 mg by mouth every 8 (eight) hours as needed for pain.     amLODipine (NORVASC) 2.5 MG tablet Take 1 tablet (2.5 mg total) by mouth daily. 90 tablet 1   aspirin EC 81 MG tablet Take 1 tablet (81 mg total) by mouth daily.     azelastine (ASTELIN)  0.1 % nasal spray Place 2 sprays into both nostrils 2 (two) times daily. Use in each nostril as directed 30 mL 1   cholecalciferol (VITAMIN D3) 25 MCG (1000 UT) tablet Take 2,000 Units by mouth daily. Patient taking 3x/week     fluticasone (FLONASE) 50 MCG/ACT nasal spray Place 2 sprays into both nostrils daily. 16 g 6   meloxicam (MOBIC) 7.5 MG tablet TAKE 1 TABLET(7.5 MG) BY MOUTH DAILY 90 tablet 0   No current facility-administered medications on file prior to visit.     Objective:  Objective  Physical Exam Constitutional:       General: She is not in acute distress.    Appearance: Normal appearance. She is not ill-appearing or toxic-appearing.  HENT:     Head: Normocephalic and atraumatic.     Right Ear: Tympanic membrane, ear canal and external ear normal.     Left Ear: Tympanic membrane, ear canal and external ear normal.     Nose: No congestion or rhinorrhea.  Eyes:     Extraocular Movements: Extraocular movements intact.     Right eye: No nystagmus.     Left eye: No nystagmus.     Pupils: Pupils are equal, round, and reactive to light.  Cardiovascular:     Rate and Rhythm: Normal rate and regular rhythm.     Pulses: Normal pulses.     Heart sounds: Normal heart sounds. No murmur heard. Pulmonary:     Effort: Pulmonary effort is normal. No respiratory distress.     Breath sounds: Normal breath sounds. No wheezing, rhonchi or rales.  Abdominal:     General: Bowel sounds are normal.     Palpations: Abdomen is soft. There is no mass.     Tenderness: no abdominal tenderness There is no guarding.     Hernia: No hernia is present.  Musculoskeletal:        General: Normal range of motion.     Cervical back: Normal range of motion and neck supple.  Skin:    General: Skin is warm and dry.  Neurological:     Mental Status: She is alert and oriented to person, place, and time.  Psychiatric:        Behavior: Behavior normal.   BP 122/80   Pulse 80   Temp 98 F (36.7 C)   Resp 16   Ht 5' (1.524 m)   Wt 157 lb 3.2 oz (71.3 kg)   SpO2 97%   BMI 30.70 kg/m  Wt Readings from Last 3 Encounters:  06/03/21 157 lb 3.2 oz (71.3 kg)  04/29/21 168 lb (76.2 kg)  03/17/21 162 lb (73.5 kg)     Lab Results  Component Value Date   WBC 6.3 06/03/2021   HGB 13.2 06/03/2021   HCT 40.3 06/03/2021   PLT 176.0 06/03/2021   GLUCOSE 89 06/03/2021   CHOL 163 06/03/2021   TRIG 67.0 06/03/2021   HDL 65.20 06/03/2021   LDLCALC 85 06/03/2021   ALT 19 06/03/2021   AST 22 06/03/2021   NA 142 06/03/2021   K 4.1  06/03/2021   CL 105 06/03/2021   CREATININE 0.84 06/03/2021   BUN 13 06/03/2021   CO2 27 06/03/2021   TSH 0.82 06/03/2021   HGBA1C 5.9 06/03/2021    DG Lumbar Spine Complete  Result Date: 07/27/2020 CLINICAL DATA:  Chronic lower back pain without known injury. EXAM: LUMBAR SPINE - COMPLETE 4+ VIEW COMPARISON:  January 14, 2015. FINDINGS: There is no evidence of  lumbar spine fracture. Alignment is normal. Intervertebral disc spaces are maintained. IMPRESSION: Negative. Electronically Signed   By: Lupita RaiderJames  Green Jr M.D.   On: 07/27/2020 16:21   DG Knee Bilateral Standing AP  Result Date: 07/27/2020 CLINICAL DATA:  Chronic bilateral knee pain without known injury. EXAM: BILATERAL KNEES STANDING - 1 VIEW COMPARISON:  None. FINDINGS: No evidence of fracture or dislocation. No evidence of arthropathy or other focal bone abnormality. Soft tissues are unremarkable. IMPRESSION: Negative. Electronically Signed   By: Lupita RaiderJames  Green Jr M.D.   On: 07/27/2020 16:22     Assessment & Plan:  Plan    Meds ordered this encounter  Medications   montelukast (SINGULAIR) 10 MG tablet    Sig: Take 1 tablet (10 mg total) by mouth at bedtime.    Dispense:  30 tablet    Refill:  5     Problem List Items Addressed This Visit     Anemia    Increase leafy greens, consider increased lean red meat and using cast iron cookware. Continue to monitor, report any concerns.        Relevant Orders   Iron, TIBC and Ferritin Panel (Completed)   Essential hypertension    Well controlled, no changes to meds. Encouraged heart healthy diet such as the DASH diet and exercise as tolerated.        Relevant Orders   CBC with Differential/Platelet (Completed)   Comprehensive metabolic panel (Completed)   TSH (Completed)   Hot flash, menopausal - Primary   Preventative health care    Patient encouraged to maintain heart healthy diet, regular exercise, adequate sleep. Consider daily probiotics. Take medications as  prescribed.       Patellofemoral arthritis of right knee    Encouraged moist heat and gentle stretching as tolerated. May try NSAIDs and prescription meds as directed and report if symptoms worsen or seek immediate care. Uses muscle relaxers prn rarely for severe pain.        Osteopenia   Vitamin D deficiency    Supplement and monitor       Relevant Orders   VITAMIN D 25 Hydroxy (Vit-D Deficiency, Fractures) (Completed)   Prediabetes    hgba1c acceptable, minimize simple carbs. Increase exercise as tolerated.        Relevant Orders   Hemoglobin A1c (Completed)   Insulin, random (Completed)   Hyperlipidemia    Encourage heart healthy diet such as MIND or DASH diet, increase exercise, avoid trans fats, simple carbohydrates and processed foods, consider a krill or fish or flaxseed oil cap daily.        Relevant Orders   Lipid panel (Completed)   Other Visit Diagnoses     Elevated PTHrP level       Relevant Orders   PTH, intact (no Ca) (Completed)   Post-menopausal       Relevant Orders   DG Bone Density   Estrogen deficiency       Relevant Orders   DG Bone Density   Need for Tdap vaccination       Relevant Orders   Tdap vaccine greater than or equal to 7yo IM (Completed)       Follow-up: Return in about 7 months (around 01/03/2022), or CPE.  Kristin BruinsI, David Hanna, acting as a scribe for Danise EdgeStacey Yerachmiel Spinney, MD, have documented all relevent documentation on behalf of Danise EdgeStacey Holden Maniscalco, MD, as directed by Danise EdgeStacey Ebelin Dillehay, MD while in the presence of Danise EdgeStacey Denny Mccree, MD.  I, Bradd CanaryStacey A Chelly Dombeck, MD  personally performed the services described in this documentation. All medical record entries made by the scribe were at my direction and in my presence. I have reviewed the chart and agree that the record reflects my personal performance and is accurate and complete

## 2021-06-04 LAB — IRON,TIBC AND FERRITIN PANEL
%SAT: 31 % (calc) (ref 16–45)
Ferritin: 171 ng/mL (ref 16–288)
Iron: 86 ug/dL (ref 45–160)
TIBC: 275 mcg/dL (calc) (ref 250–450)

## 2021-06-04 LAB — INSULIN, RANDOM: Insulin: 4.5 u[IU]/mL

## 2021-06-07 NOTE — Assessment & Plan Note (Signed)
Encouraged moist heat and gentle stretching as tolerated. May try NSAIDs and prescription meds as directed and report if symptoms worsen or seek immediate care. Uses muscle relaxers prn rarely for severe pain.

## 2021-06-10 ENCOUNTER — Ambulatory Visit (HOSPITAL_BASED_OUTPATIENT_CLINIC_OR_DEPARTMENT_OTHER)
Admission: RE | Admit: 2021-06-10 | Discharge: 2021-06-10 | Disposition: A | Discharge status: Home / Self Care | Payer: 59 | Source: Ambulatory Visit | Attending: Family Medicine | Admitting: Family Medicine

## 2021-06-10 ENCOUNTER — Other Ambulatory Visit: Payer: Self-pay

## 2021-06-10 DIAGNOSIS — E2839 Other primary ovarian failure: Secondary | ICD-10-CM | POA: Diagnosis present

## 2021-06-10 DIAGNOSIS — Z780 Asymptomatic menopausal state: Secondary | ICD-10-CM

## 2021-06-25 ENCOUNTER — Encounter: Payer: Self-pay | Admitting: Family Medicine

## 2021-07-01 ENCOUNTER — Ambulatory Visit: Payer: 59 | Admitting: Family Medicine

## 2021-07-23 ENCOUNTER — Other Ambulatory Visit: Payer: Self-pay | Admitting: Family Medicine

## 2021-07-26 ENCOUNTER — Encounter (INDEPENDENT_AMBULATORY_CARE_PROVIDER_SITE_OTHER): Payer: Self-pay | Admitting: Family Medicine

## 2021-07-26 ENCOUNTER — Other Ambulatory Visit: Payer: Self-pay

## 2021-07-26 ENCOUNTER — Ambulatory Visit (INDEPENDENT_AMBULATORY_CARE_PROVIDER_SITE_OTHER): Payer: 59 | Admitting: Family Medicine

## 2021-07-26 VITALS — BP 122/80 | HR 67 | Temp 97.8°F | Ht 61.0 in | Wt 152.0 lb

## 2021-07-26 DIAGNOSIS — E669 Obesity, unspecified: Secondary | ICD-10-CM

## 2021-07-26 DIAGNOSIS — I10 Essential (primary) hypertension: Secondary | ICD-10-CM | POA: Diagnosis not present

## 2021-07-26 DIAGNOSIS — Z6832 Body mass index (BMI) 32.0-32.9, adult: Secondary | ICD-10-CM

## 2021-07-26 NOTE — Progress Notes (Signed)
Chief Complaint:   OBESITY Jenna Johnson is here to discuss her progress with her obesity treatment plan along with follow-up of her obesity related diagnoses. Jenna Johnson is on keeping a food journal and adhering to recommended goals of noom 1200 calories per day and states she is following her eating plan approximately 50% of the time. Jenna Johnson states she is walking for 30 minutes 5 times per week.  Today's visit was #: 32 Starting weight: 171 lbs Starting date: 08/27/2018 Today's weight: 155 lbs Today's date: 07/26/2021 Total lbs lost to date: 16 lbs Total lbs lost since last in-office visit: 0  Interim History: Jenna Johnson still using Noom to journal and tries to stay around 1200 cal/day. . She does concentrate on having protein at all meals. Her weight goal is 145 lbs -150 lbs so she is a few lbs above goal. She and her husband have eaten out more recently due to moving.  Subjective:   1. Essential hypertension Her blood pressure was elevated today ( 155/93) at first check. Her blood pressure at her PCP was within normal limits in June.  BP Readings from Last 3 Encounters:  07/26/21 122/80  06/03/21 122/80  04/29/21 118/72     Assessment/Plan:   Jenna Johnson will check blood pressure at home 3-4 times a week and contact PCP if blood pressure > 140/90 consistently. She is working on healthy weight loss and exercise to improve blood pressure control.   2. Obesity: Current BMI 28.74 Jenna Johnson is currently in the action stage of change. As such, her goal is to continue with weight loss efforts. She has agreed to keeping a food journal and adhering to recommended goals of noom 1200 calories per day.  We discussed her weight goal and I advised thata goal weight between 150 lbs - 153 lbs is fine.  Exercise goals:  As is.  Behavioral modification strategies: decreasing eating out.  Jenna Johnson has agreed to follow-up with our clinic in 6 months with Jenna Hamburger, NP.  Objective:    Blood pressure 122/80, pulse 67, temperature 97.8 F (36.6 C), temperature source Oral, height 5\' 1"  (1.549 m), weight 152 lb (68.9 kg), SpO2 98 %. Body mass index is 28.72 kg/m.  General: Cooperative, alert, well developed, in no acute distress. HEENT: Conjunctivae and lids unremarkable. Cardiovascular: Regular rhythm.  Lungs: Normal work of breathing. Neurologic: No focal deficits.   Lab Results  Component Value Date   CREATININE 0.84 06/03/2021   BUN 13 06/03/2021   NA 142 06/03/2021   K 4.1 06/03/2021   CL 105 06/03/2021   CO2 27 06/03/2021   Lab Results  Component Value Date   ALT 19 06/03/2021   AST 22 06/03/2021   ALKPHOS 64 06/03/2021   BILITOT 0.4 06/03/2021   Lab Results  Component Value Date   HGBA1C 5.9 06/03/2021   HGBA1C 5.7 06/03/2020   HGBA1C 5.8 10/16/2019   HGBA1C 5.9 12/26/2018   HGBA1C 5.9 (H) 08/27/2018   Lab Results  Component Value Date   INSULIN 8.4 08/27/2018   Lab Results  Component Value Date   TSH 0.82 06/03/2021   Lab Results  Component Value Date   CHOL 163 06/03/2021   HDL 65.20 06/03/2021   LDLCALC 85 06/03/2021   TRIG 67.0 06/03/2021   CHOLHDL 3 06/03/2021   Lab Results  Component Value Date   VD25OH 52.12 06/03/2021   VD25OH 56.18 09/10/2020   VD25OH 74.45 06/03/2020   Lab Results  Component Value Date   WBC  6.3 06/03/2021   HGB 13.2 06/03/2021   HCT 40.3 06/03/2021   MCV 85.7 06/03/2021   PLT 176.0 06/03/2021   Lab Results  Component Value Date   IRON 86 06/03/2021   TIBC 275 06/03/2021   FERRITIN 171 06/03/2021   Attestation Statements:   Reviewed by clinician on day of visit: allergies, medications, problem list, medical history, surgical history, family history, social history, and previous encounter notes.  I, Jackson Latino, RMA, am acting as Energy manager for [place], FNP.   I have reviewed the above documentation for accuracy and completeness, and I agree with the above. -  Jesse Sans, FNP

## 2021-07-27 ENCOUNTER — Encounter (INDEPENDENT_AMBULATORY_CARE_PROVIDER_SITE_OTHER): Payer: Self-pay | Admitting: Family Medicine

## 2021-07-30 ENCOUNTER — Other Ambulatory Visit: Payer: Self-pay | Admitting: Family Medicine

## 2021-07-30 ENCOUNTER — Other Ambulatory Visit: Payer: Self-pay

## 2021-07-30 ENCOUNTER — Ambulatory Visit
Admission: RE | Admit: 2021-07-30 | Discharge: 2021-07-30 | Disposition: A | Discharge status: Home / Self Care | Payer: 59 | Source: Ambulatory Visit | Attending: Family Medicine | Admitting: Family Medicine

## 2021-07-30 DIAGNOSIS — Z1231 Encounter for screening mammogram for malignant neoplasm of breast: Secondary | ICD-10-CM

## 2021-08-02 ENCOUNTER — Ambulatory Visit (INDEPENDENT_AMBULATORY_CARE_PROVIDER_SITE_OTHER): Payer: 59 | Admitting: Family Medicine

## 2021-09-02 ENCOUNTER — Ambulatory Visit: Payer: 59 | Admitting: Family Medicine

## 2021-10-18 ENCOUNTER — Encounter: Payer: Self-pay | Admitting: Family Medicine

## 2021-10-19 ENCOUNTER — Other Ambulatory Visit: Payer: Self-pay | Admitting: Family Medicine

## 2021-10-19 DIAGNOSIS — I10 Essential (primary) hypertension: Secondary | ICD-10-CM

## 2021-10-19 MED ORDER — MELOXICAM 7.5 MG PO TABS: ORAL_TABLET | ORAL | 1 refills | Status: DC

## 2021-10-19 MED ORDER — AMLODIPINE BESYLATE 2.5 MG PO TABS: 2.5000 mg | ORAL_TABLET | Freq: Every day | ORAL | 1 refills | Status: DC

## 2021-12-30 ENCOUNTER — Ambulatory Visit: Payer: 59 | Admitting: Family Medicine

## 2021-12-30 VITALS — BP 118/70

## 2021-12-30 DIAGNOSIS — R7303 Prediabetes: Secondary | ICD-10-CM | POA: Diagnosis not present

## 2021-12-30 DIAGNOSIS — Z6832 Body mass index (BMI) 32.0-32.9, adult: Secondary | ICD-10-CM

## 2021-12-30 DIAGNOSIS — E66811 Obesity, class 1: Secondary | ICD-10-CM

## 2021-12-30 DIAGNOSIS — I10 Essential (primary) hypertension: Secondary | ICD-10-CM | POA: Diagnosis not present

## 2021-12-30 DIAGNOSIS — E559 Vitamin D deficiency, unspecified: Secondary | ICD-10-CM

## 2021-12-30 DIAGNOSIS — R252 Cramp and spasm: Secondary | ICD-10-CM

## 2021-12-30 DIAGNOSIS — E669 Obesity, unspecified: Secondary | ICD-10-CM

## 2021-12-30 DIAGNOSIS — M8580 Other specified disorders of bone density and structure, unspecified site: Secondary | ICD-10-CM

## 2021-12-30 DIAGNOSIS — E785 Hyperlipidemia, unspecified: Secondary | ICD-10-CM | POA: Diagnosis not present

## 2021-12-30 DIAGNOSIS — Z1159 Encounter for screening for other viral diseases: Secondary | ICD-10-CM

## 2021-12-30 DIAGNOSIS — K219 Gastro-esophageal reflux disease without esophagitis: Secondary | ICD-10-CM

## 2021-12-30 LAB — COMPREHENSIVE METABOLIC PANEL
ALT: 23 U/L (ref 0–35)
AST: 22 U/L (ref 0–37)
Albumin: 4.6 g/dL (ref 3.5–5.2)
Alkaline Phosphatase: 61 U/L (ref 39–117)
BUN: 13 mg/dL (ref 6–23)
CO2: 27 mEq/L (ref 19–32)
Calcium: 9.5 mg/dL (ref 8.4–10.5)
Chloride: 104 mEq/L (ref 96–112)
Creatinine, Ser: 0.9 mg/dL (ref 0.40–1.20)
GFR: 67.95 mL/min (ref >60.00)
Glucose, Bld: 84 mg/dL (ref 70–99)
Potassium: 4.2 mEq/L (ref 3.5–5.1)
Sodium: 139 mEq/L (ref 135–145)
Total Bilirubin: 0.5 mg/dL (ref 0.2–1.2)
Total Protein: 7.8 g/dL (ref 6.0–8.3)

## 2021-12-30 LAB — CBC
HCT: 41.8 % (ref 36.0–46.0)
Hemoglobin: 13.3 g/dL (ref 12.0–15.0)
MCHC: 31.9 g/dL (ref 30.0–36.0)
MCV: 86.6 fl (ref 78.0–100.0)
Platelets: 167 10*3/uL (ref 150.0–400.0)
RBC: 4.83 Mil/uL (ref 3.87–5.11)
RDW: 14.1 % (ref 11.5–15.5)
WBC: 6.9 10*3/uL (ref 4.0–10.5)

## 2021-12-30 LAB — LIPID PANEL
Cholesterol: 188 mg/dL (ref 0–200)
HDL: 76.1 mg/dL (ref >39.00)
LDL Cholesterol: 100 mg/dL — ABNORMAL HIGH (ref 0–99)
NonHDL: 111.85
Total CHOL/HDL Ratio: 2
Triglycerides: 60 mg/dL (ref 0.0–149.0)
VLDL: 12 mg/dL (ref 0.0–40.0)

## 2021-12-30 LAB — HEMOGLOBIN A1C: Hgb A1c MFr Bld: 5.9 % (ref 4.6–6.5)

## 2021-12-30 LAB — TSH: TSH: 0.88 u[IU]/mL (ref 0.35–5.50)

## 2021-12-30 LAB — HEPATITIS C ANTIBODY
Hepatitis C Ab: NONREACTIVE
SIGNAL TO CUT-OFF: 0.02 (ref <1.00)

## 2021-12-30 LAB — VITAMIN D 25 HYDROXY (VIT D DEFICIENCY, FRACTURES): VITD: 67.78 ng/mL (ref 30.00–100.00)

## 2021-12-30 NOTE — Assessment & Plan Note (Signed)
Encouraged DASH or MIND diet, decrease po intake and increase exercise as tolerated. Needs 7-8 hours of sleep nightly. Avoid trans fats, eat small, frequent meals every 4-5 hours with lean proteins, complex carbs and healthy fats. Minimize simple carbs, high fat foods and processed foods follows with [facility]

## 2021-12-30 NOTE — Assessment & Plan Note (Signed)
hgba1c acceptable, minimize simple carbs. Increase exercise as tolerated.  

## 2021-12-30 NOTE — Progress Notes (Signed)
Subjective:    Patient ID: Jenna Johnson, female    DOB: [birth year], 66 y.o.   MRN: [id number]  Chief Complaint  Patient presents with   7 months follow up    HPI Patient is in today for follow-up on chronic medical concerns.  She is accompanied by her husband and overall they reports she is doing well.  She continues to avoid lactose and that seems to help her tremendously.  She has been noting some increased and muscle cramps.  She does note after her last visit she did increase her fluid intake and that has been helpful but this week she has had a few muscle cramps in her right leg.  No falls or injury and no other acute concerns noted.. Denies CP/palp/SOB/HA/congestion/fevers/GI or GU c/o. Taking meds as prescribed  Past Medical History:  Diagnosis Date   Anemia    long time ago per pt    Arthritis of knee, right    and left knee    Back pain    Bursitis of both hips    Cataract    forming  right eye she thinks    Dermatitis 09/18/2013   Endometriosis    GERD (gastroesophageal reflux disease)    changed diet and better    H/O atrial septal defect    Hypertension    IBS (irritable bowel syndrome)    Lactose intolerance    Myalgia and myositis 09/18/2013   Osteopenia    mild    TIA (transient ischemic attack) 2005    Past Surgical History:  Procedure Laterality Date   ABDOMINAL HYSTERECTOMY  66 yrs old   still has one ovary   APPENDECTOMY  66 yrs old   ASD REPAIR  2006   COLONOSCOPY     12 yr in [place]    hole in heart     RIGHT OOPHORECTOMY     ruptured ovarian cyst at age 89   tia  surgery 2005    Family History  Problem Relation Age of Onset   Multiple sclerosis Mother    Other Mother        blue tumor, air embolism,   Hypertension Mother    Obesity Mother    Diabetes Father 36       type 2   Other Father        fatty liver, lactose intolerant   Thyroid disease Father    Colon polyps Father    Cancer Paternal Grandmother 29       breast    Diabetes Paternal Grandmother    Breast cancer Paternal Grandmother    Other Sister        s/p hysterectomy   Other Sister        s/p hysterectomy   Colon cancer Neg Hx    Esophageal cancer Neg Hx    Stomach cancer Neg Hx    Rectal cancer Neg Hx     Social History   Socioeconomic History   Marital status: Married    Spouse name: Jenna Johnson   Number of children: 0   Years of education: Not on file   Highest education level: Not on file  Occupational History   Occupation: Retired  Tobacco Use   Smoking status: Never   Smokeless tobacco: Never  Substance and Sexual Activity   Alcohol use: Yes    Comment: occasionally   Drug use: No   Sexual activity: Yes    Partners: Male  Other Topics Concern  Not on file  Social History Narrative   Not on file   Social Determinants of Health   Financial Resource Strain: Not on file  Food Insecurity: Not on file  Transportation Needs: Not on file  Physical Activity: Not on file  Stress: Not on file  Social Connections: Not on file  Intimate Partner Violence: Not on file    Outpatient Medications Prior to Visit  Medication Sig Dispense Refill   acetaminophen (TYLENOL) 650 MG CR tablet Take 650 mg by mouth every 8 (eight) hours as needed for pain.     amLODipine (NORVASC) 2.5 MG tablet Take 1 tablet (2.5 mg total) by mouth daily. 90 tablet 1   aspirin EC 81 MG tablet Take 1 tablet (81 mg total) by mouth daily.     azelastine (ASTELIN) 0.1 % nasal spray Place 2 sprays into both nostrils 2 (two) times daily. Use in each nostril as directed 30 mL 1   cholecalciferol (VITAMIN D3) 25 MCG (1000 UT) tablet Take 2,000 Units by mouth daily. Patient taking 3x/week     diphenhydrAMINE (BENADRYL) 25 mg capsule Take 50 mg by mouth in the morning and at bedtime.     fluticasone (FLONASE) 50 MCG/ACT nasal spray Place 2 sprays into both nostrils daily. 16 g 6   meloxicam (MOBIC) 7.5 MG tablet TAKE 1 TABLET(7.5 MG) BY MOUTH DAILY 90 tablet 1    TYRVAYA 0.03 MG/ACT SOLN Place 1 spray into both nostrils 2 (two) times daily.     No facility-administered medications prior to visit.    Allergies  Allergen Reactions   Codeine     Review of Systems  Constitutional:  Negative for fever and malaise/fatigue.  HENT:  Negative for congestion.   Eyes:  Negative for blurred vision.  Respiratory:  Negative for shortness of breath.   Cardiovascular:  Negative for chest pain, palpitations and leg swelling.  Gastrointestinal:  Negative for abdominal pain, blood in stool and nausea.  Genitourinary:  Negative for dysuria and frequency.  Musculoskeletal:  Positive for back pain and myalgias. Negative for falls.  Skin:  Negative for rash.  Neurological:  Negative for dizziness, loss of consciousness and headaches.  Endo/Heme/Allergies:  Negative for environmental allergies.  Psychiatric/Behavioral:  Negative for depression. The patient is not nervous/anxious.       Objective:    Physical Exam Constitutional:      General: She is not in acute distress.    Appearance: She is well-developed.  HENT:     Head: Normocephalic and atraumatic.  Eyes:     Conjunctiva/sclera: Conjunctivae normal.  Neck:     Thyroid: No thyromegaly.  Cardiovascular:     Rate and Rhythm: Normal rate and regular rhythm.     Heart sounds: Normal heart sounds. No murmur heard. Pulmonary:     Effort: Pulmonary effort is normal. No respiratory distress.     Breath sounds: Normal breath sounds.  Abdominal:     General: Bowel sounds are normal. There is no distension.     Palpations: Abdomen is soft. There is no mass.     Tenderness: There is no abdominal tenderness.  Musculoskeletal:     Cervical back: Neck supple.  Lymphadenopathy:     Cervical: No cervical adenopathy.  Skin:    General: Skin is warm and dry.  Neurological:     Mental Status: She is alert and oriented to person, place, and time.  Psychiatric:        Behavior: Behavior normal.  BP  118/70  Wt Readings from Last 3 Encounters:  07/26/21 152 lb (68.9 kg)  06/03/21 157 lb 3.2 oz (71.3 kg)  04/29/21 168 lb (76.2 kg)    Diabetic Foot Exam - Simple   No data filed    Lab Results  Component Value Date   WBC 6.3 06/03/2021   HGB 13.2 06/03/2021   HCT 40.3 06/03/2021   PLT 176.0 06/03/2021   GLUCOSE 89 06/03/2021   CHOL 163 06/03/2021   TRIG 67.0 06/03/2021   HDL 65.20 06/03/2021   LDLCALC 85 06/03/2021   ALT 19 06/03/2021   AST 22 06/03/2021   NA 142 06/03/2021   K 4.1 06/03/2021   CL 105 06/03/2021   CREATININE 0.84 06/03/2021   BUN 13 06/03/2021   CO2 27 06/03/2021   TSH 0.82 06/03/2021   HGBA1C 5.9 06/03/2021    Lab Results  Component Value Date   TSH 0.82 06/03/2021   Lab Results  Component Value Date   WBC 6.3 06/03/2021   HGB 13.2 06/03/2021   HCT 40.3 06/03/2021   MCV 85.7 06/03/2021   PLT 176.0 06/03/2021   Lab Results  Component Value Date   NA 142 06/03/2021   K 4.1 06/03/2021   CO2 27 06/03/2021   GLUCOSE 89 06/03/2021   BUN 13 06/03/2021   CREATININE 0.84 06/03/2021   BILITOT 0.4 06/03/2021   ALKPHOS 64 06/03/2021   AST 22 06/03/2021   ALT 19 06/03/2021   PROT 7.3 06/03/2021   ALBUMIN 4.4 06/03/2021   CALCIUM 9.3 06/03/2021   GFR 74.12 06/03/2021   Lab Results  Component Value Date   CHOL 163 06/03/2021   Lab Results  Component Value Date   HDL 65.20 06/03/2021   Lab Results  Component Value Date   LDLCALC 85 06/03/2021   Lab Results  Component Value Date   TRIG 67.0 06/03/2021   Lab Results  Component Value Date   CHOLHDL 3 06/03/2021   Lab Results  Component Value Date   HGBA1C 5.9 06/03/2021       Assessment & Plan:   Problem List Items Addressed This Visit     Essential hypertension    Well controlled, no changes to meds. Encouraged heart healthy diet such as the DASH diet and exercise as tolerated.       Relevant Orders   CBC   Comprehensive metabolic panel   TSH   GERD  (gastroesophageal reflux disease)    Avoid offending foods, start probiotics. Do not eat large meals in late evening and consider raising head of bed.       Osteopenia    Encouraged to get adequate exercise, calcium and vitamin d intake      Class 1 obesity with serious comorbidity and body mass index (BMI) of 32.0 to 32.9 in adult    Encouraged DASH or MIND diet, decrease po intake and increase exercise as tolerated. Needs 7-8 hours of sleep nightly. Avoid trans fats, eat small, frequent meals every 4-5 hours with lean proteins, complex carbs and healthy fats. Minimize simple carbs, high fat foods and processed foods follows with [facility]      Vitamin D deficiency - Primary   Relevant Orders   VITAMIN D 25 Hydroxy (Vit-D Deficiency, Fractures)   Prediabetes    hgba1c acceptable, minimize simple carbs. Increase exercise as tolerated      Relevant Orders   Hemoglobin A1c   Hyperlipidemia    Encourage heart healthy diet such as MIND or  DASH diet, increase exercise, avoid trans fats, simple carbohydrates and processed foods, consider a krill or fish or flaxseed oil cap daily.       Relevant Orders   Lipid panel   Muscle cramp    Is hydrating better and that helps some but had some this week. Will check Magnesium today. Is using Hyland cramp med. meds help. Is mostly in the right leg      Other Visit Diagnoses     Encounter for hepatitis C screening test for low risk patient       Relevant Orders   Hepatitis C Antibody       I am having [name] Y. [facility]" maintain her aspirin EC, acetaminophen, cholecalciferol, fluticasone, azelastine, diphenhydrAMINE, amLODipine, meloxicam, and Tyrvaya.  No orders of the defined types were placed in this encounter.    Penni Homans, MD

## 2021-12-30 NOTE — Patient Instructions (Addendum)
Shingrix is the new shingles shot, 2 shots over 2-6 months, confirm coverage with insurance and document, then can return here for shots with nurse appt or at pharmacy  [id number] (weekly) or Saxenda (daily) injectables for weight loss  Recommend calcium intake of 1200 to 1500 mg daily, divided into roughly 3 doses. Best source is the diet and a single dairy serving is about 500 mg, a supplement of calcium citrate once or twice daily to balance diet is fine if not getting enough in diet. Also need Vitamin D 2000 IU caps, 1 cap daily if not already taking vitamin D. Also recommend weight baring exercise on hips and upper body to keep bones strong   MIND or DASH Eating Plan DASH stands for Dietary Approaches to Stop Hypertension. The DASH eating plan is a healthy eating plan that has been shown to: Reduce high blood pressure (hypertension). Reduce your risk for type 2 diabetes, heart disease, and stroke. Help with weight loss. What are tips for following this plan? Reading food labels Check food labels for the amount of salt (sodium) per serving. Choose foods with less than 5 percent of the Daily Value of sodium. Generally, foods with less than 300 milligrams (mg) of sodium per serving fit into this eating plan. To find whole grains, look for the word "whole" as the first word in the ingredient list. Shopping Buy products labeled as "low-sodium" or "no salt added." Buy fresh foods. Avoid canned foods and pre-made or frozen meals. Cooking Avoid adding salt when cooking. Use salt-free seasonings or herbs instead of table salt or sea salt. Check with your health care provider or pharmacist before using salt substitutes. Do not fry foods. Cook foods using healthy methods such as baking, boiling, grilling, roasting, and broiling instead. Cook with heart-healthy oils, such as olive, canola, avocado, soybean, or sunflower oil. Meal planning  Eat a balanced diet that includes: 4 or more servings of fruits  and 4 or more servings of vegetables each day. Try to fill one-half of your plate with fruits and vegetables. 6-8 servings of whole grains each day. Less than 6 oz (170 g) of lean meat, poultry, or fish each day. A 3-oz (85-g) serving of meat is about the same size as a deck of cards. One egg equals 1 oz (28 g). 2-3 servings of low-fat dairy each day. One serving is 1 cup (237 mL). 1 serving of nuts, seeds, or beans 5 times each week. 2-3 servings of heart-healthy fats. Healthy fats called omega-3 fatty acids are found in foods such as walnuts, flaxseeds, fortified milks, and eggs. These fats are also found in cold-water fish, such as sardines, salmon, and mackerel. Limit how much you eat of: Canned or prepackaged foods. Food that is high in trans fat, such as some fried foods. Food that is high in saturated fat, such as fatty meat. Desserts and other sweets, sugary drinks, and other foods with added sugar. Full-fat dairy products. Do not salt foods before eating. Do not eat more than 4 egg yolks a week. Try to eat at least 2 vegetarian meals a week. Eat more home-cooked food and less restaurant, buffet, and fast food. Lifestyle When eating at a restaurant, ask that your food be prepared with less salt or no salt, if possible. If you drink alcohol: Limit how much you use to: 0-1 drink a day for women who are not pregnant. 0-2 drinks a day for men. Be aware of how much alcohol is in your drink.  In the [place] one drink equals one 12 oz bottle of beer (355 mL), one 5 oz glass of wine (148 mL), or one 1 oz glass of hard liquor (44 mL). General information Avoid eating more than 2,300 mg of salt a day. If you have hypertension, you may need to reduce your sodium intake to 1,500 mg a day. Work with your health care provider to maintain a healthy body weight or to lose weight. Ask what an ideal weight is for you. Get at least 30 minutes of exercise that causes your heart to beat faster (aerobic  exercise) most days of the week. Activities may include walking, swimming, or biking. Work with your health care provider or dietitian to adjust your eating plan to your individual calorie needs. What foods should I eat? Fruits All fresh, dried, or frozen fruit. Canned fruit in natural juice (without added sugar). Vegetables Fresh or frozen vegetables (raw, steamed, roasted, or grilled). Low-sodium or reduced-sodium tomato and vegetable juice. Low-sodium or reduced-sodium tomato sauce and tomato paste. Low-sodium or reduced-sodium canned vegetables. Grains Whole-grain or whole-wheat bread. Whole-grain or whole-wheat pasta. Brown rice. Orpah Cobb. Bulgur. Whole-grain and low-sodium cereals. Pita bread. Low-fat, low-sodium crackers. Whole-wheat flour tortillas. Meats and other proteins Skinless chicken or [place]. Ground chicken or [place]. Pork with fat trimmed off. Fish and seafood. Egg whites. Dried beans, peas, or lentils. Unsalted nuts, nut butters, and seeds. Unsalted canned beans. Lean cuts of beef with fat trimmed off. Low-sodium, lean precooked or cured meat, such as sausages or meat loaves. Dairy Low-fat (1%) or fat-free (skim) milk. Reduced-fat, low-fat, or fat-free cheeses. Nonfat, low-sodium ricotta or cottage cheese. Low-fat or nonfat yogurt. Low-fat, low-sodium cheese. Fats and oils Soft margarine without trans fats. Vegetable oil. Reduced-fat, low-fat, or light mayonnaise and salad dressings (reduced-sodium). Canola, safflower, olive, avocado, soybean, and sunflower oils. Avocado. Seasonings and condiments Herbs. Spices. Seasoning mixes without salt. Other foods Unsalted popcorn and pretzels. Fat-free sweets. The items listed above may not be a complete list of foods and beverages you can eat. Contact a dietitian for more information. What foods should I avoid? Fruits Canned fruit in a light or heavy syrup. Fried fruit. Fruit in cream or butter sauce. Vegetables Creamed or  fried vegetables. Vegetables in a cheese sauce. Regular canned vegetables (not low-sodium or reduced-sodium). Regular canned tomato sauce and paste (not low-sodium or reduced-sodium). Regular tomato and vegetable juice (not low-sodium or reduced-sodium). Rosita Fire. Olives. Grains Baked goods made with fat, such as croissants, muffins, or some breads. Dry pasta or rice meal packs. Meats and other proteins Fatty cuts of meat. Ribs. Fried meat. Tomasa Blase. Bologna, salami, and other precooked or cured meats, such as sausages or meat loaves. Fat from the back of a pig (fatback). Bratwurst. Salted nuts and seeds. Canned beans with added salt. Canned or smoked fish. Whole eggs or egg yolks. Chicken or [place] with skin. Dairy Whole or 2% milk, cream, and half-and-half. Whole or full-fat cream cheese. Whole-fat or sweetened yogurt. Full-fat cheese. Nondairy creamers. Whipped toppings. Processed cheese and cheese spreads. Fats and oils Butter. Stick margarine. Lard. Shortening. Ghee. Bacon fat. Tropical oils, such as coconut, palm kernel, or palm oil. Seasonings and condiments Onion salt, garlic salt, seasoned salt, table salt, and sea salt. Worcestershire sauce. Tartar sauce. Barbecue sauce. Teriyaki sauce. Soy sauce, including reduced-sodium. Steak sauce. Canned and packaged gravies. Fish sauce. Oyster sauce. Cocktail sauce. Store-bought horseradish. Ketchup. Mustard. Meat flavorings and tenderizers. Bouillon cubes. Hot sauces. Pre-made or packaged marinades. Pre-made or  packaged taco seasonings. Relishes. Regular salad dressings. Other foods Salted popcorn and pretzels. The items listed above may not be a complete list of foods and beverages you should avoid. Contact a dietitian for more information. Where to find more information National Heart, Lung, and Blood Institute: PopSteam.iswww.nhlbi.nih.gov American Heart Association: www.heart.org Academy of Nutrition and Dietetics: www.eatright.org National Kidney Foundation:  www.kidney.org Summary The DASH eating plan is a healthy eating plan that has been shown to reduce high blood pressure (hypertension). It may also reduce your risk for type 2 diabetes, heart disease, and stroke. When on the DASH eating plan, aim to eat more fresh fruits and vegetables, whole grains, lean proteins, low-fat dairy, and heart-healthy fats. With the DASH eating plan, you should limit salt (sodium) intake to 2,300 mg a day. If you have hypertension, you may need to reduce your sodium intake to 1,500 mg a day. Work with your health care provider or dietitian to adjust your eating plan to your individual calorie needs. This information is not intended to replace advice given to you by your health care provider. Make sure you discuss any questions you have with your health care provider. Document Revised: 10/25/2019 Document Reviewed: 10/25/2019 Elsevier Patient Education  2022 [employer]   [place]

## 2021-12-30 NOTE — Assessment & Plan Note (Signed)
Well controlled, no changes to meds. Encouraged heart healthy diet such as the DASH diet and exercise as tolerated.  °

## 2021-12-30 NOTE — Assessment & Plan Note (Signed)
Encouraged to get adequate exercise, calcium and vitamin d intake 

## 2021-12-30 NOTE — Assessment & Plan Note (Addendum)
Is hydrating better and that helps some but had some this week. Will check Magnesium today. Is using Hyland cramp med. meds help. Is mostly in the right leg

## 2021-12-30 NOTE — Assessment & Plan Note (Signed)
Encourage heart healthy diet such as MIND or DASH diet, increase exercise, avoid trans fats, simple carbohydrates and processed foods, consider a krill or fish or flaxseed oil cap daily.  °

## 2021-12-30 NOTE — Assessment & Plan Note (Signed)
Avoid offending foods, start probiotics. Do not eat large meals in late evening and consider raising head of bed.  

## 2022-01-17 ENCOUNTER — Ambulatory Visit (INDEPENDENT_AMBULATORY_CARE_PROVIDER_SITE_OTHER): Payer: 59 | Admitting: Adult Health

## 2022-01-26 ENCOUNTER — Ambulatory Visit (INDEPENDENT_AMBULATORY_CARE_PROVIDER_SITE_OTHER): Payer: 59 | Admitting: Adult Health

## 2022-01-26 ENCOUNTER — Encounter (INDEPENDENT_AMBULATORY_CARE_PROVIDER_SITE_OTHER): Payer: Self-pay | Admitting: Adult Health

## 2022-01-26 ENCOUNTER — Other Ambulatory Visit: Payer: Self-pay

## 2022-01-26 VITALS — BP 125/78 | HR 66 | Temp 97.8°F | Ht 61.0 in | Wt 160.0 lb

## 2022-01-26 DIAGNOSIS — E559 Vitamin D deficiency, unspecified: Secondary | ICD-10-CM

## 2022-01-26 DIAGNOSIS — Z6830 Body mass index (BMI) 30.0-30.9, adult: Secondary | ICD-10-CM | POA: Diagnosis not present

## 2022-01-26 DIAGNOSIS — I10 Essential (primary) hypertension: Secondary | ICD-10-CM | POA: Diagnosis not present

## 2022-01-26 DIAGNOSIS — E669 Obesity, unspecified: Secondary | ICD-10-CM

## 2022-01-26 NOTE — Progress Notes (Signed)
Chief Complaint:   OBESITY Jenna Johnson is here to discuss her progress with her obesity treatment plan along with follow-up of her obesity related diagnoses. [name] is journaling with Noom (1200 calories per day) and states she is following her eating plan approximately 0% of the time. [name] states she is swimming/walking 30-40 minutes 1-2 times per week.  Today's visit was #: 77 Starting weight: 171 lbs Starting date: 08/27/2018 Today's weight: 160 lbs Today's date: 01/26/2022 Total lbs lost to date: 11 lbs Total lbs lost since last in-office visit: 0  Interim History:  Jenna Johnson's last office visit at [place] - 07/26/2021 [facility], [facility]). On 12/30/2021, office visit with PCP/Dr. Charlett Blake - fasting labs completed - recommended to follow-up with [facility].   Goal weight 145-150 pounds, current weight 160 pounds.  Discussed goal weight 150-153 pounds.    Moved in July/August 2022 - rental home, longer commute, still adjusting to a regular walking/swimming routine. Moving again in July 2023- unsure as to where.  Typical day of intake: Breakfast:  1 egg, 1/2 ounce sausage, peppers, 1/2 cup oatmeal, 2 prunes, yogurt, blueberries.  Snack - apple or popcorn.   Dinner- 4-5 ounces meat protein and 1-1.5 cups vegetables.  Of note:  Husband, Jenna Johnson, at bedside during office visit. Both she and her husband are lactose intolerant.  Subjective:   1. Essential hypertension BP/HR at goal at office visit. On 12/30/2021, CMP - electrolytes/kidney function - stable. She is on amlodipine 2.5 mg daily - managed by PCP.  2. Vitamin D deficiency On 12/30/2021, vitamin D level - 67.78 - stable. She is on OTC vitamin D3 2000 IU - 3x per week.  Assessment/Plan:   1. Essential hypertension Continue CCB.  2. Vitamin D deficiency Continue OTC supplement.  3. Obesity, current BMI 30.4  Jenna Johnson is currently in the action stage of change. As such, her goal is to continue with weight loss  efforts. She has agreed to Noom, will adjust eating plan after RMR results at next OV.  Check IC at next office visit - pt aware to arrive 30 minutes early.  Exercise goals:  As is.  Behavioral modification strategies: increasing lean protein intake, decreasing simple carbohydrates, meal planning and cooking strategies, keeping healthy foods in the home, and planning for success.  Jenna Johnson has agreed to follow-up with our clinic in 4 weeks, fasting/IC. She was informed of the importance of frequent follow-up visits to maximize her success with intensive lifestyle modifications for her multiple health conditions.   Objective:   Blood pressure 125/78, pulse 66, temperature 97.8 F (36.6 C), temperature source Oral, height 5\' 1"  (1.549 m), weight 160 lb (72.6 kg), SpO2 97 %. Body mass index is 30.23 kg/m.  General: Cooperative, alert, well developed, in no acute distress. HEENT: Conjunctivae and lids unremarkable. Cardiovascular: Regular rhythm.  Lungs: Normal work of breathing. Neurologic: No focal deficits.   Lab Results  Component Value Date   CREATININE 0.90 12/30/2021   BUN 13 12/30/2021   NA 139 12/30/2021   K 4.2 12/30/2021   CL 104 12/30/2021   CO2 27 12/30/2021   Lab Results  Component Value Date   ALT 23 12/30/2021   AST 22 12/30/2021   ALKPHOS 61 12/30/2021   BILITOT 0.5 12/30/2021   Lab Results  Component Value Date   HGBA1C 5.9 12/30/2021   HGBA1C 5.9 06/03/2021   HGBA1C 5.7 06/03/2020   HGBA1C 5.8 10/16/2019   HGBA1C 5.9 12/26/2018   Lab Results  Component Value  Date   INSULIN 8.4 08/27/2018   Lab Results  Component Value Date   TSH 0.88 12/30/2021   Lab Results  Component Value Date   CHOL 188 12/30/2021   HDL 76.10 12/30/2021   LDLCALC 100 (H) 12/30/2021   TRIG 60.0 12/30/2021   CHOLHDL 2 12/30/2021   Lab Results  Component Value Date   VD25OH 67.78 12/30/2021   VD25OH 52.12 06/03/2021   VD25OH 56.18 09/10/2020   Lab Results   Component Value Date   WBC 6.9 12/30/2021   HGB 13.3 12/30/2021   HCT 41.8 12/30/2021   MCV 86.6 12/30/2021   PLT 167.0 12/30/2021   Lab Results  Component Value Date   IRON 86 06/03/2021   TIBC 275 06/03/2021   FERRITIN 171 06/03/2021   Attestation Statements:   Reviewed by clinician on day of visit: allergies, medications, problem list, medical history, surgical history, family history, social history, and previous encounter notes.  Time spent on visit including pre-visit chart review and post-visit care and charting was 32 minutes.   I, Water quality scientist, CMA, am acting as Location manager for Mina Marble, NP.  I have reviewed the above documentation for accuracy and completeness, and I agree with the above. -  Sidnee Gambrill d. Lilliah Priego, NP-C

## 2022-03-03 ENCOUNTER — Ambulatory Visit (INDEPENDENT_AMBULATORY_CARE_PROVIDER_SITE_OTHER): Payer: 59 | Admitting: Adult Health

## 2022-03-03 ENCOUNTER — Encounter (INDEPENDENT_AMBULATORY_CARE_PROVIDER_SITE_OTHER): Payer: Self-pay | Admitting: Adult Health

## 2022-03-03 VITALS — BP 116/76 | HR 72 | Temp 97.6°F | Ht 61.0 in | Wt 159.0 lb

## 2022-03-03 DIAGNOSIS — E559 Vitamin D deficiency, unspecified: Secondary | ICD-10-CM | POA: Diagnosis not present

## 2022-03-03 DIAGNOSIS — I10 Essential (primary) hypertension: Secondary | ICD-10-CM | POA: Diagnosis not present

## 2022-03-03 DIAGNOSIS — R0602 Shortness of breath: Secondary | ICD-10-CM | POA: Diagnosis not present

## 2022-03-03 DIAGNOSIS — E669 Obesity, unspecified: Secondary | ICD-10-CM

## 2022-03-03 DIAGNOSIS — Z6830 Body mass index (BMI) 30.0-30.9, adult: Secondary | ICD-10-CM

## 2022-03-21 DIAGNOSIS — R0602 Shortness of breath: Secondary | ICD-10-CM | POA: Insufficient documentation

## 2022-03-21 NOTE — Progress Notes (Signed)
? ? ? ?Chief Complaint:  ? ?OBESITY ?Jenna Johnson is here to discuss her progress with her obesity treatment plan along with follow-up of her obesity related diagnoses. Jenna Johnson is on NOOM and states she is following her eating plan approximately 0% of the time. Jenna Johnson states she is swimming/walking 3 times per week.   ? ?Today's visit was #: 33 ?Starting weight: 171 lbs ?Starting date: 08/27/2018 ?Today's weight: 159 lbs ?Today's date: 03/03/2022 ?Total lbs lost to date: 12 ?Total lbs lost since last in-office visit: 1 ? ?Interim History:  ?Jenna Johnson's RMR on 08/17/2018 was 1556, RMR on 09/24/2019 was 1382, and RMR today is 1310-essentually unchanged from last check.  ? ?Of note: husband "[name]" at [facility] during office visit.  ? ?Subjective:  ? ?1. Vitamin D deficiency ?Haifa is on OTC Vitamin D3 1,000 2 capsules daily 3 times per week.  ? ?2. Shortness of breath ?Jenna Johnson reports dyspnea with extreme exertion, and she denies chest pain with exertion.  ? ?3. Essential hypertension ?Jenna Johnson's blood pressure is heart rate  are stable. She is on amlodipine  2.5 mg daily, and she denies lower extremity edema.  ? ?Assessment/Plan:  ? ?1. Vitamin D deficiency ?Continue with current OTC Vit D Supplementation. ? ?2. Shortness of breath ?IC was done today.  ? ?3. Essential hypertension ?Continue current low dose CCB. ? ?4. Obesity, current BMI 30.2 ?Jenna Johnson is currently in the action stage of change. As such, her goal is to continue with weight loss efforts. She has agreed to following a lower carbohydrate, vegetable and lean protein rich diet plan.  Tracking via NOOM. ? ?Exercise goals: Adults should also include muscle-strengthening activities that involve all major muscle groups on 2 or more days a week. ? ?Behavioral modification strategies: increasing lean protein intake, decreasing simple carbohydrates, increasing water intake, decreasing sodium intake, meal planning and cooking strategies, keeping  healthy foods in the home, and planning for success. ? ?Jenna Johnson has agreed to follow-up with our clinic in 12 weeks. She was informed of the importance of frequent follow-up visits to maximize her success with intensive lifestyle modifications for her multiple health conditions.  ? ?Objective:  ? ?Blood pressure 116/76, pulse 72, temperature 97.6 ?F (36.4 ?C), height 5\' 1"  (1.549 m), weight 159 lb (72.1 kg), SpO2 96 %. ?Body mass index is 30.04 kg/m?. ? ?General: Cooperative, alert, well developed, in no acute distress. ?HEENT: Conjunctivae and lids unremarkable. ?Cardiovascular: Regular rhythm.  ?Lungs: Normal work of breathing. ?Neurologic: No focal deficits.  ? ?Lab Results  ?Component Value Date  ? CREATININE 0.90 12/30/2021  ? BUN 13 12/30/2021  ? NA 139 12/30/2021  ? K 4.2 12/30/2021  ? CL 104 12/30/2021  ? CO2 27 12/30/2021  ? ?Lab Results  ?Component Value Date  ? ALT 23 12/30/2021  ? AST 22 12/30/2021  ? ALKPHOS 61 12/30/2021  ? BILITOT 0.5 12/30/2021  ? ?Lab Results  ?Component Value Date  ? HGBA1C 5.9 12/30/2021  ? HGBA1C 5.9 06/03/2021  ? HGBA1C 5.7 06/03/2020  ? HGBA1C 5.8 10/16/2019  ? HGBA1C 5.9 12/26/2018  ? ?Lab Results  ?Component Value Date  ? INSULIN 8.4 08/27/2018  ? ?Lab Results  ?Component Value Date  ? TSH 0.88 12/30/2021  ? ?Lab Results  ?Component Value Date  ? CHOL 188 12/30/2021  ? HDL 76.10 12/30/2021  ? LDLCALC 100 (H) 12/30/2021  ? TRIG 60.0 12/30/2021  ? CHOLHDL 2 12/30/2021  ? ?Lab Results  ?Component Value Date  ? VD25OH 67.78 12/30/2021  ?  VD25OH 52.12 06/03/2021  ? VD25OH 56.18 09/10/2020  ? ?Lab Results  ?Component Value Date  ? WBC 6.9 12/30/2021  ? HGB 13.3 12/30/2021  ? HCT 41.8 12/30/2021  ? MCV 86.6 12/30/2021  ? PLT 167.0 12/30/2021  ? ?Lab Results  ?Component Value Date  ? IRON 86 06/03/2021  ? TIBC 275 06/03/2021  ? FERRITIN 171 06/03/2021  ? ?Attestation Statements:  ? ?Reviewed by clinician on day of visit: allergies, medications, problem list, medical history,  surgical history, family history, social history, and previous encounter notes. ? ? ?I, Burt Knack, am acting as transcriptionist for William Hamburger, NP. ? ?I have reviewed the above documentation for accuracy and completeness, and I agree with the above. -  Lawrance Wiedemann d. Falon Flinchum, NP-C ? ?

## 2022-04-19 ENCOUNTER — Other Ambulatory Visit: Payer: Self-pay

## 2022-04-19 MED ORDER — MELOXICAM 7.5 MG PO TABS: ORAL_TABLET | ORAL | 1 refills | Status: DC

## 2022-05-05 ENCOUNTER — Telehealth: Payer: Self-pay | Admitting: Family Medicine

## 2022-05-05 DIAGNOSIS — I10 Essential (primary) hypertension: Secondary | ICD-10-CM

## 2022-05-05 NOTE — Telephone Encounter (Signed)
Medication: amLODipine (NORVASC) 2.5 MG tablet  Has the patient contacted their pharmacy? Yes.    Preferred Pharmacy (with phone number or street name):   [id number] - [place], [place] - [address] AT [facility] [address]  [address] [place] [address], [place] [address]  Phone:  [phone]  Fax:  [phone]   Agent: Please be advised that RX refills may take up to 3 business days. We ask that you follow-up with your pharmacy.

## 2022-05-06 MED ORDER — AMLODIPINE BESYLATE 2.5 MG PO TABS: 2.5000 mg | ORAL_TABLET | Freq: Every day | ORAL | 1 refills | Status: DC

## 2022-05-06 NOTE — Telephone Encounter (Signed)
Refill sent.

## 2022-05-26 ENCOUNTER — Encounter (INDEPENDENT_AMBULATORY_CARE_PROVIDER_SITE_OTHER): Payer: Self-pay | Admitting: Adult Health

## 2022-05-26 ENCOUNTER — Ambulatory Visit (INDEPENDENT_AMBULATORY_CARE_PROVIDER_SITE_OTHER): Payer: 59 | Admitting: Adult Health

## 2022-05-26 VITALS — BP 137/82 | HR 64 | Temp 97.7°F | Ht 61.0 in | Wt 162.0 lb

## 2022-05-26 DIAGNOSIS — E669 Obesity, unspecified: Secondary | ICD-10-CM

## 2022-05-26 DIAGNOSIS — Z6830 Body mass index (BMI) 30.0-30.9, adult: Secondary | ICD-10-CM | POA: Diagnosis not present

## 2022-05-26 DIAGNOSIS — I10 Essential (primary) hypertension: Secondary | ICD-10-CM

## 2022-05-30 ENCOUNTER — Ambulatory Visit (INDEPENDENT_AMBULATORY_CARE_PROVIDER_SITE_OTHER): Payer: 59 | Admitting: Adult Health

## 2022-05-30 NOTE — Progress Notes (Signed)
Chief Complaint:   OBESITY Jenna Johnson is here to discuss her progress with her obesity treatment plan along with follow-up of her obesity related diagnoses. Jenna Johnson is on following a lower carbohydrate, vegetable and lean protein rich diet plan/NOOM and states she is following her eating plan approximately 20% of the time. Jenna Johnson states she is swimming and walking for 45 minutes 2-3 times per week.  Today's visit was #: 34 Starting weight: 171 lbs Starting date: 08/27/2018 Today's weight: 162 lbs Today's date: 05/26/2022 Total lbs lost to date: 9 Total lbs lost since last in-office visit: 0  Interim History:  Jenna Johnson is still home hunting and frequently traveling.   Her husband will be fully retired on June 04, 2022.   They are currently in a small home in [place], [place] a permanent [place], [address], [address], [employer].  Of note: Husband "Jenna Johnson" at [employer] during office visit.  Subjective:   1. Benign essential HTN Jenna Johnson's blood pressure is stable.  She is on amlodipine 2.5 mg daily. She denies lower extremity edema.  Assessment/Plan:   1. Benign essential HTN Jenna Johnson will continue her daily CCB.   2. Obesity, current BMI 30.6 Jenna Johnson is currently in the action stage of change. As such, her goal is to continue with weight loss efforts. She has agreed to Intermittent fasting, 16/8, 14/10, and track with NOOM 1330 calories..   Exercise goals: As is.  Behavioral modification strategies: increasing lean protein intake, decreasing simple carbohydrates, meal planning and cooking strategies, keeping healthy foods in the home, and planning for success.  Jenna Johnson has agreed to follow-up with our clinic in 5 months. She was informed of the importance of frequent follow-up visits to maximize her success with intensive lifestyle modifications for her multiple health conditions.   Objective:   Blood pressure 137/82,  pulse 64, temperature 97.7 F (36.5 C), height 5\' 1"  (1.549 m), weight 162 lb (73.5 kg), SpO2 100 %. Body mass index is 30.61 kg/m.  General: Cooperative, alert, well developed, in no acute distress. HEENT: Conjunctivae and lids unremarkable. Cardiovascular: Regular rhythm.  Lungs: Normal work of breathing. Neurologic: No focal deficits.   Lab Results  Component Value Date   CREATININE 0.90 12/30/2021   BUN 13 12/30/2021   NA 139 12/30/2021   K 4.2 12/30/2021   CL 104 12/30/2021   CO2 27 12/30/2021   Lab Results  Component Value Date   ALT 23 12/30/2021   AST 22 12/30/2021   ALKPHOS 61 12/30/2021   BILITOT 0.5 12/30/2021   Lab Results  Component Value Date   HGBA1C 5.9 12/30/2021   HGBA1C 5.9 06/03/2021   HGBA1C 5.7 06/03/2020   HGBA1C 5.8 10/16/2019   HGBA1C 5.9 12/26/2018   Lab Results  Component Value Date   INSULIN 8.4 08/27/2018   Lab Results  Component Value Date   TSH 0.88 12/30/2021   Lab Results  Component Value Date   CHOL 188 12/30/2021   HDL 76.10 12/30/2021   LDLCALC 100 (H) 12/30/2021   TRIG 60.0 12/30/2021   CHOLHDL 2 12/30/2021   Lab Results  Component Value Date   VD25OH 67.78 12/30/2021   VD25OH 52.12 06/03/2021   VD25OH 56.18 09/10/2020   Lab Results  Component Value Date   WBC 6.9 12/30/2021   HGB 13.3 12/30/2021   HCT 41.8 12/30/2021   MCV 86.6 12/30/2021   PLT 167.0 12/30/2021   Lab Results  Component Value Date   IRON 86 06/03/2021  TIBC 275 06/03/2021   FERRITIN 171 06/03/2021   Attestation Statements:   Reviewed by clinician on day of visit: allergies, medications, problem list, medical history, surgical history, family history, social history, and previous encounter notes.  Time spent on visit including pre-visit chart review and post-visit care and charting was 28 minutes.   Trude Mcburney, am acting as transcriptionist for William Hamburger, NP.  I have reviewed the above documentation for accuracy and  completeness, and I agree with the above. -  Axzel Rockhill d. Seth Higginbotham, NP-C

## 2022-06-15 NOTE — Progress Notes (Unsigned)
Subjective:    Patient ID: Jenna Johnson, female    DOB: 07/13/1956, 66 y.o.   MRN: [id number]  No chief complaint on file.   HPI Patient is in today for a physical.  Past Medical History:  Diagnosis Date   Anemia    long time ago per pt    Arthritis of knee, right    and left knee    Back pain    Bursitis of both hips    Cataract    forming  right eye she thinks    Dermatitis 09/18/2013   Endometriosis    GERD (gastroesophageal reflux disease)    changed diet and better    H/O atrial septal defect    Hypertension    IBS (irritable bowel syndrome)    Lactose intolerance    Myalgia and myositis 09/18/2013   Osteopenia    mild    TIA (transient ischemic attack) 2005    Past Surgical History:  Procedure Laterality Date   ABDOMINAL HYSTERECTOMY  66 yrs old   still has one ovary   APPENDECTOMY  67 yrs old   ASD REPAIR  2006   COLONOSCOPY     10 yr in [place]    hole in heart     RIGHT OOPHORECTOMY     ruptured ovarian cyst at age 79   tia  surgery 2005    Family History  Problem Relation Age of Onset   Multiple sclerosis Mother    Other Mother        blue tumor, air embolism,   Hypertension Mother    Obesity Mother    Diabetes Father 43       type 2   Other Father        fatty liver, lactose intolerant   Thyroid disease Father    Colon polyps Father    Cancer Paternal Grandmother 72       breast   Diabetes Paternal Grandmother    Breast cancer Paternal Grandmother    Other Sister        s/p hysterectomy   Other Sister        s/p hysterectomy   Colon cancer Neg Hx    Esophageal cancer Neg Hx    Stomach cancer Neg Hx    Rectal cancer Neg Hx     Social History   Socioeconomic History   Marital status: Married    Spouse name: Jenna Johnson   Number of children: 0   Years of education: Not on file   Highest education level: Not on file  Occupational History   Occupation: Retired  Tobacco Use   Smoking status: Never   Smokeless  tobacco: Never  Substance and Sexual Activity   Alcohol use: Yes    Comment: occasionally   Drug use: No   Sexual activity: Yes    Partners: Male  Other Topics Concern   Not on file  Social History Narrative   Not on file   Social Determinants of Health   Financial Resource Strain: Not on file  Food Insecurity: Not on file  Transportation Needs: Not on file  Physical Activity: Not on file  Stress: Not on file  Social Connections: Not on file  Intimate Partner Violence: Not on file    Outpatient Medications Prior to Visit  Medication Sig Dispense Refill   acetaminophen (TYLENOL) 650 MG CR tablet Take 650 mg by mouth every 8 (eight) hours as needed for pain.     amLODipine (  NORVASC) 2.5 MG tablet Take 1 tablet (2.5 mg total) by mouth daily. 90 tablet 1   aspirin EC 81 MG tablet Take 1 tablet (81 mg total) by mouth daily.     azelastine (ASTELIN) 0.1 % nasal spray Place 2 sprays into both nostrils 2 (two) times daily. Use in each nostril as directed (Patient not taking: Reported on 05/26/2022) 30 mL 1   cholecalciferol (VITAMIN D3) 25 MCG (1000 UT) tablet Take 2,000 Units by mouth daily. Patient taking 3x/week     diphenhydrAMINE (BENADRYL) 25 mg capsule Take 50 mg by mouth in the morning and at bedtime.     fluticasone (FLONASE) 50 MCG/ACT nasal spray Place 2 sprays into both nostrils daily. 16 g 6   meloxicam (MOBIC) 7.5 MG tablet TAKE 1 TABLET(7.5 MG) BY MOUTH DAILY 90 tablet 1   TYRVAYA 0.03 MG/ACT SOLN Place 1 spray into both nostrils 2 (two) times daily.     No facility-administered medications prior to visit.    Allergies  Allergen Reactions   Codeine     ROS     Objective:    Physical Exam  There were no vitals taken for this visit. Wt Readings from Last 3 Encounters:  05/26/22 162 lb (73.5 kg)  03/03/22 159 lb (72.1 kg)  01/26/22 160 lb (72.6 kg)    Diabetic Foot Exam - Simple   No data filed    Lab Results  Component Value Date   WBC 6.9 12/30/2021    HGB 13.3 12/30/2021   HCT 41.8 12/30/2021   PLT 167.0 12/30/2021   GLUCOSE 84 12/30/2021   CHOL 188 12/30/2021   TRIG 60.0 12/30/2021   HDL 76.10 12/30/2021   LDLCALC 100 (H) 12/30/2021   ALT 23 12/30/2021   AST 22 12/30/2021   NA 139 12/30/2021   K 4.2 12/30/2021   CL 104 12/30/2021   CREATININE 0.90 12/30/2021   BUN 13 12/30/2021   CO2 27 12/30/2021   TSH 0.88 12/30/2021   HGBA1C 5.9 12/30/2021    Lab Results  Component Value Date   TSH 0.88 12/30/2021   Lab Results  Component Value Date   WBC 6.9 12/30/2021   HGB 13.3 12/30/2021   HCT 41.8 12/30/2021   MCV 86.6 12/30/2021   PLT 167.0 12/30/2021   Lab Results  Component Value Date   NA 139 12/30/2021   K 4.2 12/30/2021   CO2 27 12/30/2021   GLUCOSE 84 12/30/2021   BUN 13 12/30/2021   CREATININE 0.90 12/30/2021   BILITOT 0.5 12/30/2021   ALKPHOS 61 12/30/2021   AST 22 12/30/2021   ALT 23 12/30/2021   PROT 7.8 12/30/2021   ALBUMIN 4.6 12/30/2021   CALCIUM 9.5 12/30/2021   GFR 67.95 12/30/2021   Lab Results  Component Value Date   CHOL 188 12/30/2021   Lab Results  Component Value Date   HDL 76.10 12/30/2021   Lab Results  Component Value Date   LDLCALC 100 (H) 12/30/2021   Lab Results  Component Value Date   TRIG 60.0 12/30/2021   Lab Results  Component Value Date   CHOLHDL 2 12/30/2021   Lab Results  Component Value Date   HGBA1C 5.9 12/30/2021       Assessment & Plan:   COLONOSCOPY: 05/28/20 MAMMO: 07/30/2021 PAP: 06/01/20 PSA: n/a DEXA: 06/10/2021   Problem List Items Addressed This Visit   None   I am having Jenna Johnson" maintain her aspirin EC, acetaminophen, cholecalciferol, fluticasone, azelastine, diphenhydrAMINE,  Tyrvaya, meloxicam, and amLODipine.  No orders of the defined types were placed in this encounter.

## 2022-06-16 ENCOUNTER — Ambulatory Visit (INDEPENDENT_AMBULATORY_CARE_PROVIDER_SITE_OTHER): Payer: BC Managed Care – PPO | Admitting: Family Medicine

## 2022-06-16 ENCOUNTER — Encounter: Payer: Self-pay | Admitting: Family Medicine

## 2022-06-16 VITALS — BP 128/80 | HR 69 | Resp 20 | Ht 61.0 in | Wt 166.6 lb

## 2022-06-16 DIAGNOSIS — K219 Gastro-esophageal reflux disease without esophagitis: Secondary | ICD-10-CM | POA: Diagnosis not present

## 2022-06-16 DIAGNOSIS — Z0000 Encounter for general adult medical examination without abnormal findings: Secondary | ICD-10-CM | POA: Diagnosis not present

## 2022-06-16 DIAGNOSIS — R739 Hyperglycemia, unspecified: Secondary | ICD-10-CM | POA: Diagnosis not present

## 2022-06-16 DIAGNOSIS — E559 Vitamin D deficiency, unspecified: Secondary | ICD-10-CM | POA: Diagnosis not present

## 2022-06-16 DIAGNOSIS — R252 Cramp and spasm: Secondary | ICD-10-CM

## 2022-06-16 DIAGNOSIS — E785 Hyperlipidemia, unspecified: Secondary | ICD-10-CM | POA: Diagnosis not present

## 2022-06-16 DIAGNOSIS — E669 Obesity, unspecified: Secondary | ICD-10-CM

## 2022-06-16 DIAGNOSIS — M8580 Other specified disorders of bone density and structure, unspecified site: Secondary | ICD-10-CM

## 2022-06-16 DIAGNOSIS — Z6832 Body mass index (BMI) 32.0-32.9, adult: Secondary | ICD-10-CM

## 2022-06-16 DIAGNOSIS — E66811 Obesity, class 1: Secondary | ICD-10-CM

## 2022-06-16 LAB — TSH: TSH: 1 u[IU]/mL (ref 0.35–5.50)

## 2022-06-16 LAB — COMPREHENSIVE METABOLIC PANEL
ALT: 18 U/L (ref 0–35)
AST: 21 U/L (ref 0–37)
Albumin: 4.4 g/dL (ref 3.5–5.2)
Alkaline Phosphatase: 63 U/L (ref 39–117)
BUN: 16 mg/dL (ref 6–23)
CO2: 27 mEq/L (ref 19–32)
Calcium: 9.2 mg/dL (ref 8.4–10.5)
Chloride: 105 mEq/L (ref 96–112)
Creatinine, Ser: 0.88 mg/dL (ref 0.40–1.20)
GFR: 69.59 mL/min (ref >60.00)
Glucose, Bld: 85 mg/dL (ref 70–99)
Potassium: 4.2 mEq/L (ref 3.5–5.1)
Sodium: 140 mEq/L (ref 135–145)
Total Bilirubin: 0.4 mg/dL (ref 0.2–1.2)
Total Protein: 7.4 g/dL (ref 6.0–8.3)

## 2022-06-16 LAB — LIPID PANEL
Cholesterol: 162 mg/dL (ref 0–200)
HDL: 71.3 mg/dL (ref >39.00)
LDL Cholesterol: 77 mg/dL (ref 0–99)
NonHDL: 90.81
Total CHOL/HDL Ratio: 2
Triglycerides: 71 mg/dL (ref 0.0–149.0)
VLDL: 14.2 mg/dL (ref 0.0–40.0)

## 2022-06-16 LAB — CBC
HCT: 40.5 % (ref 36.0–46.0)
Hemoglobin: 12.9 g/dL (ref 12.0–15.0)
MCHC: 31.8 g/dL (ref 30.0–36.0)
MCV: 87.1 fl (ref 78.0–100.0)
Platelets: 161 10*3/uL (ref 150.0–400.0)
RBC: 4.64 Mil/uL (ref 3.87–5.11)
RDW: 14.2 % (ref 11.5–15.5)
WBC: 7.3 10*3/uL (ref 4.0–10.5)

## 2022-06-16 LAB — HEMOGLOBIN A1C: Hgb A1c MFr Bld: 6 % (ref 4.6–6.5)

## 2022-06-16 LAB — VITAMIN D 25 HYDROXY (VIT D DEFICIENCY, FRACTURES): VITD: 66.49 ng/mL (ref 30.00–100.00)

## 2022-06-16 LAB — MAGNESIUM: Magnesium: 2 mg/dL (ref 1.5–2.5)

## 2022-06-16 NOTE — Assessment & Plan Note (Signed)
Hydrate and monitor 

## 2022-06-16 NOTE — Patient Instructions (Addendum)
Yerba Matte tea do not drink  Magnesium Glycinate 200-400 mg nightly  Lemon Eucalyptus  Preventive Care 40-64 Years  Old, Female Preventive care refers to lifestyle choices and visits with your health care provider that can promote health and wellness. Preventive care visits are also called wellness exams. What can I expect for my preventive care visit? Counseling Your health care provider may ask you questions about your: Medical history, including: Past medical problems. Family medical history. Pregnancy history. Current health, including: Menstrual cycle. Method of birth control. Emotional well-being. Home life and relationship well-being. Sexual activity and sexual health. Lifestyle, including: Alcohol, nicotine or tobacco, and drug use. Access to firearms. Diet, exercise, and sleep habits. Work and work Statistician. Sunscreen use. Safety issues such as seatbelt and bike helmet use. Physical exam Your health care provider will check your: Height and weight. These may be used to calculate your BMI (body mass index). BMI is a measurement that tells if you are at a healthy weight. Waist circumference. This measures the distance around your waistline. This measurement also tells if you are at a healthy weight and may help predict your risk of certain diseases, such as type 2 diabetes and high blood pressure. Heart rate and blood pressure. Body temperature. Skin for abnormal spots. What immunizations do I need?  Vaccines are usually given at various ages, according to a schedule. Your health care provider will recommend vaccines for you based on your age, medical history, and lifestyle or other factors, such as travel or where you work. What tests do I need? Screening Your health care provider may recommend screening tests for certain conditions. This may include: Lipid and cholesterol levels. Diabetes screening. This is done by checking your blood sugar (glucose) after you  have not eaten for a while (fasting). Pelvic exam and Pap test. Hepatitis B test. Hepatitis C test. HIV (human immunodeficiency virus) test. STI (sexually transmitted infection) testing, if you are at risk. Lung cancer screening. Colorectal cancer screening. Mammogram. Talk with your health care provider about when you should start having regular mammograms. This may depend on whether you have a family history of breast cancer. BRCA-related cancer screening. This may be done if you have a family history of breast, ovarian, tubal, or peritoneal cancers. Bone density scan. This is done to screen for osteoporosis. Talk with your health care provider about your test results, treatment options, and if necessary, the need for more tests. Follow these instructions at home: Eating and drinking  Eat a diet that includes fresh fruits and vegetables, whole grains, lean protein, and low-fat dairy products. Take vitamin and mineral supplements as recommended by your health care provider. Do not drink alcohol if: Your health care provider tells you not to drink. You are pregnant, may be pregnant, or are planning to become pregnant. If you drink alcohol: Limit how much you have to 0-1 drink a day. Know how much alcohol is in your drink. In the [place] one drink equals one 12 oz bottle of beer (355 mL), one 5 oz glass of wine (148 mL), or one 1 oz glass of hard liquor (44 mL). Lifestyle Brush your teeth every morning and night with fluoride toothpaste. Floss one time each day. Exercise for at least 30 minutes 5 or more days each week. Do not use any products that contain nicotine or tobacco. These products include cigarettes, chewing tobacco, and vaping devices, such as e-cigarettes. If you need help quitting, ask your health care provider. Do not use drugs.  If you are sexually active, practice safe sex. Use a condom or other form of protection to prevent STIs. If you do not wish to become pregnant, use  a form of birth control. If you plan to become pregnant, see your health care provider for a prepregnancy visit. Take aspirin only as told by your health care provider. Make sure that you understand how much to take and what form to take. Work with your health care provider to find out whether it is safe and beneficial for you to take aspirin daily. Find healthy ways to manage stress, such as: Meditation, yoga, or listening to music. Journaling. Talking to a trusted person. Spending time with friends and family. Minimize exposure to UV radiation to reduce your risk of skin cancer. Safety Always wear your seat belt while driving or riding in a vehicle. Do not drive: If you have been drinking alcohol. Do not ride with someone who has been drinking. When you are tired or distracted. While texting. If you have been using any mind-altering substances or drugs. Wear a helmet and other protective equipment during sports activities. If you have firearms in your house, make sure you follow all gun safety procedures. Seek help if you have been physically or sexually abused. What's next? Visit your health care provider once a year for an annual wellness visit. Ask your health care provider how often you should have your eyes and teeth checked. Stay up to date on all vaccines. This information is not intended to replace advice given to you by your health care provider. Make sure you discuss any questions you have with your health care provider. Document Revised: 05/19/2021 Document Reviewed: 05/19/2021 Elsevier Patient Education  [place].

## 2022-06-16 NOTE — Assessment & Plan Note (Signed)
encouraged heart healthy diet, avoid trans fats, minimize simple carbs and saturated fats. Increase exercise as tolerated 

## 2022-06-16 NOTE — Assessment & Plan Note (Signed)
Supplement and monitor 

## 2022-06-16 NOTE — Assessment & Plan Note (Signed)
hgba1c acceptable, minimize simple carbs. Increase exercise as tolerated.  

## 2022-06-16 NOTE — Assessment & Plan Note (Signed)
Encouraged DASH or MIND diet, decrease po intake and increase exercise as tolerated. Needs 7-8 hours of sleep nightly. Avoid trans fats, eat small, frequent meals every 4-5 hours with lean proteins, complex carbs and healthy fats. Minimize simple carbs, high fat foods and processed foods 

## 2022-06-16 NOTE — Assessment & Plan Note (Signed)
Patient encouraged to maintain heart healthy diet, regular exercise, adequate sleep. Consider daily probiotics. Take medications as prescribed. Labs ordered and reviewed COLONOSCOPY: 05/28/20 repeat in 10 years MAMMO: 07/30/2021 repeat in 1-2 years PAP: 06/01/20 repeat in 2 years  DEXA: 06/10/2021 repeat in 2-3 years

## 2022-06-16 NOTE — Assessment & Plan Note (Signed)
Encouraged to get adequate exercise, calcium and vitamin d intake 

## 2022-07-07 ENCOUNTER — Other Ambulatory Visit: Payer: Self-pay

## 2022-07-07 DIAGNOSIS — Z1283 Encounter for screening for malignant neoplasm of skin: Secondary | ICD-10-CM

## 2022-07-13 ENCOUNTER — Encounter (INDEPENDENT_AMBULATORY_CARE_PROVIDER_SITE_OTHER): Payer: Self-pay

## 2022-07-15 ENCOUNTER — Other Ambulatory Visit: Payer: Self-pay | Admitting: Family Medicine

## 2022-07-15 DIAGNOSIS — Z1231 Encounter for screening mammogram for malignant neoplasm of breast: Secondary | ICD-10-CM

## 2022-07-25 ENCOUNTER — Ambulatory Visit: Payer: 59 | Admitting: Dermatology

## 2022-08-03 ENCOUNTER — Ambulatory Visit
Admission: RE | Admit: 2022-08-03 | Discharge: 2022-08-03 | Disposition: A | Discharge status: Home / Self Care | Payer: BC Managed Care – PPO | Source: Ambulatory Visit | Attending: Family Medicine | Admitting: Family Medicine

## 2022-08-03 DIAGNOSIS — Z1231 Encounter for screening mammogram for malignant neoplasm of breast: Secondary | ICD-10-CM

## 2022-09-28 NOTE — Progress Notes (Unsigned)
    Jenna Johnson is a 66 y.o. female who presents to [place] at [facility] today for Bilateral foot pain. Patient was last seen by Dr. Tamala Julian on 04/29/21 for hamstring pain. Today patient states that she has been having bilateral foot [ain since beginning of September patient locates the pain around the back of her heel in a horse shoe shape. R>L. Patient has tried many different shoes and only ones with a high arch helps. Walking has been restricted and cannot do a lot of walking or stand for long periods of time. Patient has been moving and shopping for furniture and was walking on hard floors and up and down stairs, packing she thinks that maybe what caused it.   Radiates: no Numbness/tingling: no  Aggravates: walking, rest Treatments tried: ice, rest, stretching, tylenol, patient does have Meloxicam that she takes     Pertinent review of systems: No fevers or chills  Relevant historical information: Osteopenia   Exam:  BP 124/82   Pulse 64   Ht 5\' 1"  (1.549 m)   Wt 168 lb (76.2 kg)   SpO2 98%   BMI 31.74 kg/m  General: Well Developed, well nourished, and in no acute distress.   MSK: Feet bilaterally mild pes planus otherwise normal-appearing Tender palpation plantar calcaneus favoring the posterior aspect of the plantar calcaneus. Normal foot and ankle motion.  Intact strength.     Assessment and Plan: 66 y.o. female with bilateral plantar calcaneal pain right worse than left.  Pain thought to be planter fasciitis.  Plan for home exercise program.  Additionally recommend night splints and ice massage heel cushioning.  Recheck in 1 month.   Discussed warning signs or symptoms. Please see discharge instructions. Patient expresses understanding.   The above documentation has been reviewed and is accurate and complete Lynne Leader, M.D.

## 2022-09-29 ENCOUNTER — Ambulatory Visit: Payer: BC Managed Care – PPO | Admitting: Family Medicine

## 2022-09-29 VITALS — BP 124/82 | HR 64 | Ht 61.0 in | Wt 168.0 lb

## 2022-09-29 DIAGNOSIS — M722 Plantar fascial fibromatosis: Secondary | ICD-10-CM

## 2022-09-29 DIAGNOSIS — M2141 Flat foot [pes planus] (acquired), right foot: Secondary | ICD-10-CM

## 2022-09-29 DIAGNOSIS — M2142 Flat foot [pes planus] (acquired), left foot: Secondary | ICD-10-CM

## 2022-09-29 NOTE — Patient Instructions (Signed)
Thank you for coming in today.   Add night splint for plantar fasciitis.   Work on the heel exercises. Go from up to down slowly.   Ice massage with a frozen cup of water.   Recheck in 1 month.

## 2022-10-04 ENCOUNTER — Encounter (INDEPENDENT_AMBULATORY_CARE_PROVIDER_SITE_OTHER): Payer: Self-pay | Admitting: Adult Health

## 2022-10-04 ENCOUNTER — Ambulatory Visit (INDEPENDENT_AMBULATORY_CARE_PROVIDER_SITE_OTHER): Payer: BC Managed Care – PPO | Admitting: Adult Health

## 2022-10-04 VITALS — BP 130/70 | HR 71 | Temp 97.5°F | Ht 61.0 in | Wt 163.0 lb

## 2022-10-04 DIAGNOSIS — E559 Vitamin D deficiency, unspecified: Secondary | ICD-10-CM

## 2022-10-04 DIAGNOSIS — E669 Obesity, unspecified: Secondary | ICD-10-CM

## 2022-10-04 DIAGNOSIS — I10 Essential (primary) hypertension: Secondary | ICD-10-CM | POA: Diagnosis not present

## 2022-10-04 DIAGNOSIS — Z6830 Body mass index (BMI) 30.0-30.9, adult: Secondary | ICD-10-CM

## 2022-10-05 DIAGNOSIS — I10 Essential (primary) hypertension: Secondary | ICD-10-CM | POA: Insufficient documentation

## 2022-10-05 NOTE — Progress Notes (Signed)
Chief Complaint:   OBESITY Jenna Johnson is here to discuss her progress with her obesity treatment plan along with follow-up of her obesity related diagnoses. Jenna Johnson is on intermittent fasting and states she is following her eating plan approximately 95% of the time. Jenna Johnson states she is not exercising due to a heel spur.   Today's visit was #: 81  Starting weight: 171 lbs Starting date: 08/27/2018 Today's weight: 163 lbs Today's date: 10/04/2022 Total lbs lost to date: 8 lbs Total lbs lost since last in-office visit: +1 lb  Interim History:  Since last office visit: -she has purchased a home. -moved into the new home with her wonderful husband. -her 2 year old father fell ill and she and her husband travelled to and stayed with him during recovering for 4 weeks in [place].  She has bilateral plantar calcaneal. Right and left.   The last 3 weeks she has been following Intermittent Fasting 16/8.  No hypoglycemia.   Typical day of intake includes- yogurt, oatmeal with prunes, eggs, apple, 5-6 oz of meat.   Subjective:   1. Vitamin D deficiency She is currently on OTC Vitamin D-3, 1,000 IU 2 capsules QD therefore 2,000 IU daily.  She reports stable energy levels.  2. Essential hypertension Blood pressure stable at office visit.  She is on daily Amlodipine 2.5mg , managed by her PCP/Dr. Charlett Blake. She denies lower extremity edema.  Assessment/Plan:   1. Vitamin D deficiency Continue OTC supplement.   2. Essential hypertension Continue Norvasc 2.5 mg daily.  Remain well hydrated.  3. Obesity, current BMI 30.8 Handout:  100-200 snack sheet, Thanksgiving strategies, holiday recipes.    Check fasting labs at next office visit.   Jenna Johnson is currently in the action stage of change. As such, her goal is to continue with weight loss efforts. She has agreed to intermittent fasting.   Exercise goals:  As is.   Behavioral modification strategies: increasing  lean protein intake, decreasing simple carbohydrates, meal planning and cooking strategies, keeping healthy foods in the home, and planning for success.  Jenna Johnson has agreed to follow-up with our clinic in 5 months.  She was informed of the importance of frequent follow-up visits to maximize her success with intensive lifestyle modifications for her multiple health conditions.   Objective:   Blood pressure 130/70, pulse 71, temperature (!) 97.5 F (36.4 C), height 5\' 1"  (1.549 m), weight 163 lb (73.9 kg), SpO2 97 %. Body mass index is 30.8 kg/m.  General: Cooperative, alert, well developed, in no acute distress. HEENT: Conjunctivae and lids unremarkable. Cardiovascular: Regular rhythm.  Lungs: Normal work of breathing. Neurologic: No focal deficits.   Lab Results  Component Value Date   CREATININE 0.88 06/16/2022   BUN 16 06/16/2022   NA 140 06/16/2022   K 4.2 06/16/2022   CL 105 06/16/2022   CO2 27 06/16/2022   Lab Results  Component Value Date   ALT 18 06/16/2022   AST 21 06/16/2022   ALKPHOS 63 06/16/2022   BILITOT 0.4 06/16/2022   Lab Results  Component Value Date   HGBA1C 6.0 06/16/2022   HGBA1C 5.9 12/30/2021   HGBA1C 5.9 06/03/2021   HGBA1C 5.7 06/03/2020   HGBA1C 5.8 10/16/2019   Lab Results  Component Value Date   INSULIN 8.4 08/27/2018   Lab Results  Component Value Date   TSH 1.00 06/16/2022   Lab Results  Component Value Date   CHOL 162 06/16/2022   HDL 71.30 06/16/2022   LDLCALC  77 06/16/2022   TRIG 71.0 06/16/2022   CHOLHDL 2 06/16/2022   Lab Results  Component Value Date   VD25OH 66.49 06/16/2022   VD25OH 67.78 12/30/2021   VD25OH 52.12 06/03/2021   Lab Results  Component Value Date   WBC 7.3 06/16/2022   HGB 12.9 06/16/2022   HCT 40.5 06/16/2022   MCV 87.1 06/16/2022   PLT 161.0 06/16/2022   Lab Results  Component Value Date   IRON 86 06/03/2021   TIBC 275 06/03/2021   FERRITIN 171 06/03/2021   Attestation Statements:    Reviewed by clinician on day of visit: allergies, medications, problem list, medical history, surgical history, family history, social history, and previous encounter notes.  I, Malcolm Metro, RMA, am acting as Energy manager for William Hamburger, NP.  I have reviewed the above documentation for accuracy and completeness, and I agree with the above. -  Tinleigh Whitmire d. Chauncey Sciulli, NP-C

## 2022-10-18 ENCOUNTER — Ambulatory Visit (INDEPENDENT_AMBULATORY_CARE_PROVIDER_SITE_OTHER): Payer: 59 | Admitting: Adult Health

## 2022-10-26 ENCOUNTER — Telehealth: Payer: Self-pay | Admitting: Family Medicine

## 2022-10-26 ENCOUNTER — Other Ambulatory Visit: Payer: Self-pay

## 2022-10-26 DIAGNOSIS — I10 Essential (primary) hypertension: Secondary | ICD-10-CM

## 2022-10-26 MED ORDER — AMLODIPINE BESYLATE 2.5 MG PO TABS: 2.5000 mg | ORAL_TABLET | Freq: Every day | ORAL | 1 refills | Status: DC

## 2022-10-26 NOTE — Telephone Encounter (Signed)
Medication: amLODipine (NORVASC) 2.5 MG tablet  Has the patient contacted their pharmacy? No.   Preferred Pharmacy:  [facility] [address] [place], [place] [address]

## 2022-10-26 NOTE — Telephone Encounter (Signed)
Medication was sent

## 2022-10-28 ENCOUNTER — Other Ambulatory Visit: Payer: Self-pay | Admitting: Family Medicine

## 2022-11-01 ENCOUNTER — Ambulatory Visit (INDEPENDENT_AMBULATORY_CARE_PROVIDER_SITE_OTHER): Payer: BC Managed Care – PPO

## 2022-11-01 ENCOUNTER — Ambulatory Visit: Payer: BC Managed Care – PPO | Admitting: Family Medicine

## 2022-11-01 VITALS — BP 128/82 | HR 71 | Ht 61.0 in | Wt 170.0 lb

## 2022-11-01 DIAGNOSIS — M722 Plantar fascial fibromatosis: Secondary | ICD-10-CM

## 2022-11-01 NOTE — Progress Notes (Signed)
   I, Philbert Riser, LAT, ATC acting as a scribe for Clementeen Graham, MD.  Jenna Johnson is a 66 y.o. female who presents to [facility] at [facility] today for her 1 month follow-up of bilateral foot pain, thought to be due to plantar fasciitis.  Patient was last seen by Dr. Denyse Amass on 09/29/2022 and was advised to use night splints, ice massage, heel cushioning, and was taught HEP.  Today, patient reports L foot is feeling a lot better. Pt c/o R foot is still painful, esp w/ prolonged standing. Pt feels like there is a "knot" along the plantar aspect of the R calcaneous. Pt has been compliant w/ treatment plan and HEP.    Pertinent review of systems: No fevers or chills  Relevant historical information: Trochanteric bursitis   Exam:  BP 128/82   Pulse 71   Ht 5\' 1"  (1.549 m)   Wt 170 lb (77.1 kg)   SpO2 95%   BMI 32.12 kg/m  General: Well Developed, well nourished, and in no acute distress.   MSK: Right foot normal. Tender palpation plantar calcaneus.  Normal foot and ankle motion.    Lab and Radiology Results  X-ray images right calcaneus obtained today personally and independently interpreted Small posterior and plantar calcaneal heel spurs are present.  No acute fractures are visible. Await formal radiology review     Assessment and Plan: 66 y.o. female with bilateral plantar fasciitis right worse than left.  Continue conservative management.  Will reassess in 1 month.  If not improved consider shockwave therapy.  She would very much like to avoid injections which I think is a good idea.  We reviewed some home exercise program today.   PDMP not reviewed this encounter. Orders Placed This Encounter  Procedures   DG Os Calcis Right    Standing Status:   Future    Number of Occurrences:   1    Standing Expiration Date:   11/02/2023    Order Specific Question:   Reason for Exam (SYMPTOM  OR DIAGNOSIS REQUIRED)    Answer:   right heel pain    Order  Specific Question:   Preferred imaging location?    Answer:   11/04/2023   No orders of the defined types were placed in this encounter.    Discussed warning signs or symptoms. Please see discharge instructions. Patient expresses understanding.   The above documentation has been reviewed and is accurate and complete Kyra Searles, M.D.

## 2022-11-01 NOTE — Patient Instructions (Addendum)
Thank you for coming in today.   Please get an Xray today before you leave   Work on the home exercise.   In 1 month if not better we can do shockwave.

## 2022-11-02 NOTE — Progress Notes (Signed)
Right heel bone shows a little bit of a heel spur likely protected.  No fractures are visible.

## 2022-12-07 ENCOUNTER — Ambulatory Visit: Payer: BC Managed Care – PPO | Admitting: Family Medicine

## 2022-12-07 VITALS — BP 122/74 | HR 73 | Ht 61.0 in | Wt 170.0 lb

## 2022-12-07 DIAGNOSIS — M722 Plantar fascial fibromatosis: Secondary | ICD-10-CM

## 2022-12-07 DIAGNOSIS — Z7189 Other specified counseling: Secondary | ICD-10-CM

## 2022-12-07 NOTE — Patient Instructions (Addendum)
Thank you for coming in today.   Recheck as needed.   Continue home exercises.   See if you can wean off meloxicam and use only as needed.   Ok to use tylenol arthritis 2 pills every 8 hours as needed.

## 2022-12-07 NOTE — Progress Notes (Signed)
   I, Peterson Lombard, LAT, ATC acting as a scribe for Lynne Leader, MD.  Jenna Johnson is a 67 y.o. female who presents to [facility] at [facility] today for f/u bilat, R>L, plantar fascitis. Pt was last seen by Dr. Georgina Snell on 11/01/22 and was advised to cont conservative management and consider shockwave therapy in the future. Today, pt reports slow improvement in her feet pain. Pt wears the splint intermittently. Pt was able to walk about 4 blocks when visiting w/ friends.   She takes meloxicam every day.  Pertinent review of systems: No fevers or chills.  Positive for some upset stomach in the morning when she takes meloxicam.  Relevant historical information: Hypertension.   Exam:  BP 122/74   Pulse 73   Ht 5\' 1"  (1.549 m)   Wt 170 lb (77.1 kg)   SpO2 96%   BMI 32.12 kg/m  General: Well Developed, well nourished, and in no acute distress.   MSK: Right foot normal appearing Mildly tender palpation plantar calcaneus.  Normal foot and ankle motion.    Lab and Radiology Results EXAM: RIGHT OS CALCIS-axial and lateral views   COMPARISON:  None Available.   FINDINGS: There is no evidence of fracture. No osteolytic or osteoblastic changes. Posterior and plantar calcaneal spurs are noted.   IMPRESSION: Posterior and plantar calcaneal spurs. No acute osseous abnormalities.     Electronically Signed   By: Sammie Bench M.D.   On: 11/01/2022 21:28 I, Lynne Leader, personally (independently) visualized and performed the interpretation of the images attached in this note.     Assessment and Plan: 67 y.o. female with right plantar fasciitis.  Plan to continue home exercise program.  She is slowly improving.  We talked about meloxicam safety in general.  I think it is okay to use this medicine as needed but probably she should be taking it every day if she can avoid it.  Recommend trying to switch to extended release Tylenol on a more regular basis  and use meloxicam on the bad days.  This should help her hypertension and reduce some of the adverse effects of meloxicam.  Recheck as needed.   Discussed warning signs or symptoms. Please see discharge instructions. Patient expresses understanding.   The above documentation has been reviewed and is accurate and complete Lynne Leader, M.D.

## 2022-12-20 ENCOUNTER — Ambulatory Visit: Payer: BC Managed Care – PPO | Admitting: Family Medicine

## 2022-12-20 VITALS — BP 136/74 | HR 70 | Temp 98.0°F | Resp 16 | Ht 60.0 in | Wt 171.4 lb

## 2022-12-20 DIAGNOSIS — I10 Essential (primary) hypertension: Secondary | ICD-10-CM

## 2022-12-20 DIAGNOSIS — R252 Cramp and spasm: Secondary | ICD-10-CM

## 2022-12-20 DIAGNOSIS — K2100 Gastro-esophageal reflux disease with esophagitis, without bleeding: Secondary | ICD-10-CM

## 2022-12-20 DIAGNOSIS — E559 Vitamin D deficiency, unspecified: Secondary | ICD-10-CM | POA: Diagnosis not present

## 2022-12-20 DIAGNOSIS — E785 Hyperlipidemia, unspecified: Secondary | ICD-10-CM | POA: Diagnosis not present

## 2022-12-20 DIAGNOSIS — M8580 Other specified disorders of bone density and structure, unspecified site: Secondary | ICD-10-CM

## 2022-12-20 DIAGNOSIS — R739 Hyperglycemia, unspecified: Secondary | ICD-10-CM

## 2022-12-20 MED ORDER — OMEPRAZOLE MAGNESIUM 20 MG PO TBEC: 20.0000 mg | DELAYED_RELEASE_TABLET | ORAL | 3 refills | Status: DC | PRN

## 2022-12-20 MED ORDER — FAMOTIDINE 40 MG PO TABS: 40.0000 mg | ORAL_TABLET | Freq: Every day | ORAL | 2 refills | Status: DC

## 2022-12-20 NOTE — Progress Notes (Signed)
Subjective:   By signing my name below, I, Kellie Simmering, attest that this documentation has been prepared under the direction and in the presence of Mosie Lukes, MD., 12/20/2022.     Patient ID: Jenna Johnson, female    DOB: 08/21/1956, 67 y.o.   MRN: [id number]  Chief Complaint  Patient presents with   Follow-up    Follow up   HPI Patient is in today for an office visit. She denies CP/palpitations/SOB/HA/ fever/chills/or GU symptoms.   Heel Spurs She complains of bilateral heel spurs that are hindering her mobility. She states that her heels are inflamed and causing pain.  GERD She reports having a pressure in the right side of the chest upon eating and then laying on her left side. Her bowel movements have been normal. She stopped taking Meloxicam 10 days ago but this has caused her hip pain to return. She has seen Dr. Silverio Decamp in the past for a lower endoscopy and is interested in returning for an upper endoscopy.   Sinus Congestion She reports having tenderness behind her ears due to sinus congestion. Denies hearing loss or tinnitus.  Weight Patient continues to see [facility]. Body mass index is 33.47 kg/m. Wt Readings from Last 3 Encounters:  12/23/22 170 lb (77.1 kg)  12/20/22 171 lb 6.4 oz (77.7 kg)  12/07/22 170 lb (77.1 kg)   Lab Results  Component Value Date   HGBA1C 6.0 06/16/2022   Past Medical History:  Diagnosis Date   Anemia    long time ago per pt    Arthritis of knee, right    and left knee    Back pain    Bursitis of both hips    Cataract    forming  right eye she thinks    Dermatitis 09/18/2013   Endometriosis    GERD (gastroesophageal reflux disease)    changed diet and better    H/O atrial septal defect    Hypertension    IBS (irritable bowel syndrome)    Lactose intolerance    Myalgia and myositis 09/18/2013   Osteopenia    mild    TIA (transient ischemic attack) 2005    Past Surgical History:  Procedure  Laterality Date   ABDOMINAL HYSTERECTOMY  67 yrs old   still has one ovary   APPENDECTOMY  67 yrs old   ASD REPAIR  2006   COLONOSCOPY     12 yr in [place]    hole in heart     RIGHT OOPHORECTOMY     ruptured ovarian cyst at age 76   tia  surgery 2005    Family History  Problem Relation Age of Onset   ADD / ADHD Mother    Multiple sclerosis Mother    Other Mother        blue tumor, air embolism,   Hypertension Mother    Obesity Mother    Diabetes Father 48       type 2   Other Father        fatty liver, lactose intolerant   Thyroid disease Father    Colon polyps Father    Other Sister        s/p hysterectomy   Other Sister        s/p hysterectomy   Cancer Paternal Grandmother 22       breast   Diabetes Paternal Grandmother    Breast cancer Paternal Grandmother    Colon cancer Neg Hx  Esophageal cancer Neg Hx    Stomach cancer Neg Hx    Rectal cancer Neg Hx     Social History   Socioeconomic History   Marital status: Married    Spouse name: Renelda Kilian   Number of children: 0   Years of education: Not on file   Highest education level: Not on file  Occupational History   Occupation: Retired  Tobacco Use   Smoking status: Never   Smokeless tobacco: Never  Substance and Sexual Activity   Alcohol use: Yes    Comment: occasionally   Drug use: No   Sexual activity: Yes    Partners: Male  Other Topics Concern   Not on file  Social History Narrative   Not on file   Social Determinants of Health   Financial Resource Strain: Not on file  Food Insecurity: Not on file  Transportation Needs: Not on file  Physical Activity: Not on file  Stress: Not on file  Social Connections: Not on file  Intimate Partner Violence: Not on file    Outpatient Medications Prior to Visit  Medication Sig Dispense Refill   cycloSPORINE (RESTASIS) 0.05 % ophthalmic emulsion Place 1 drop into both eyes 2 (two) times daily. 0.4 mL    acetaminophen (TYLENOL) 650 MG CR tablet  Take 650 mg by mouth every 8 (eight) hours as needed for pain.     amLODipine (NORVASC) 2.5 MG tablet Take 1 tablet (2.5 mg total) by mouth daily. 90 tablet 1   aspirin EC 81 MG tablet Take 1 tablet (81 mg total) by mouth daily.     calcium carbonate (OSCAL) 1500 (600 Ca) MG TABS tablet Take 600 mg of elemental calcium by mouth 2 (two) times daily with a meal. Also includes of VitaminD     cholecalciferol (VITAMIN D3) 25 MCG (1000 UT) tablet Take 2,000 Units by mouth daily. Patient taking 3x/week     diphenhydrAMINE (BENADRYL) 25 mg capsule Take 50 mg by mouth in the morning and at bedtime.     meloxicam (MOBIC) 7.5 MG tablet TAKE 1 TABLET(7.5 MG) BY MOUTH DAILY 90 tablet 1   omeprazole (PRILOSEC OTC) 20 MG tablet Take 20 mg by mouth as needed.     No facility-administered medications prior to visit.    Allergies  Allergen Reactions   Codeine     Review of Systems  Constitutional:  Negative for chills and fever.  HENT:  Negative for hearing loss and tinnitus.   Respiratory:  Negative for shortness of breath.   Cardiovascular:  Negative for chest pain and palpitations.  Gastrointestinal:  Negative for abdominal pain, blood in stool, constipation, diarrhea, nausea and vomiting.  Genitourinary:  Negative for dysuria, frequency, hematuria and urgency.  Skin:           Neurological:  Negative for headaches.       Objective:    Physical Exam Constitutional:      General: She is not in acute distress.    Appearance: Normal appearance. She is normal weight. She is not ill-appearing.  HENT:     Head: Normocephalic and atraumatic.     Right Ear: Hearing, tympanic membrane, ear canal and external ear normal.     Left Ear: Hearing, tympanic membrane, ear canal and external ear normal.     Ears:     Comments: small, reactive lymph node behind the left ear.    Nose: Nose normal.     Mouth/Throat:     Mouth: Mucous  membranes are moist.     Pharynx: Oropharynx is clear.  Eyes:      General:        Right eye: No discharge.        Left eye: No discharge.     Extraocular Movements: Extraocular movements intact.     Conjunctiva/sclera: Conjunctivae normal.     Pupils: Pupils are equal, round, and reactive to light.  Cardiovascular:     Rate and Rhythm: Normal rate and regular rhythm.     Pulses: Normal pulses.     Heart sounds: Normal heart sounds. No murmur heard.    No gallop.  Pulmonary:     Effort: Pulmonary effort is normal. No respiratory distress.     Breath sounds: Normal breath sounds. No wheezing or rales.  Abdominal:     General: Bowel sounds are normal.     Palpations: Abdomen is soft.     Tenderness: There is no abdominal tenderness. There is no guarding.  Musculoskeletal:        General: Normal range of motion.     Cervical back: Normal range of motion.     Right lower leg: No edema.     Left lower leg: No edema.  Skin:    General: Skin is warm and dry.  Neurological:     Mental Status: She is alert and oriented to person, place, and time.  Psychiatric:        Mood and Affect: Mood normal.        Behavior: Behavior normal.        Judgment: Judgment normal.     BP 136/74 (BP Location: Right Arm, Patient Position: Sitting, Cuff Size: Normal)   Pulse 70   Temp 98 F (36.7 C) (Oral)   Resp 16   Ht 5' (1.524 m)   Wt 171 lb 6.4 oz (77.7 kg)   SpO2 98%   BMI 33.47 kg/m  Wt Readings from Last 3 Encounters:  12/23/22 170 lb (77.1 kg)  12/20/22 171 lb 6.4 oz (77.7 kg)  12/07/22 170 lb (77.1 kg)    Diabetic Foot Exam - Simple   No data filed    Lab Results  Component Value Date   WBC 7.3 06/16/2022   HGB 12.9 06/16/2022   HCT 40.5 06/16/2022   PLT 161.0 06/16/2022   GLUCOSE 85 06/16/2022   CHOL 162 06/16/2022   TRIG 71.0 06/16/2022   HDL 71.30 06/16/2022   LDLCALC 77 06/16/2022   ALT 18 06/16/2022   AST 21 06/16/2022   NA 140 06/16/2022   K 4.2 06/16/2022   CL 105 06/16/2022   CREATININE 0.88 06/16/2022   BUN 16 06/16/2022    CO2 27 06/16/2022   TSH 1.00 06/16/2022   HGBA1C 6.0 06/16/2022    Lab Results  Component Value Date   TSH 1.00 06/16/2022   Lab Results  Component Value Date   WBC 7.3 06/16/2022   HGB 12.9 06/16/2022   HCT 40.5 06/16/2022   MCV 87.1 06/16/2022   PLT 161.0 06/16/2022   Lab Results  Component Value Date   NA 140 06/16/2022   K 4.2 06/16/2022   CO2 27 06/16/2022   GLUCOSE 85 06/16/2022   BUN 16 06/16/2022   CREATININE 0.88 06/16/2022   BILITOT 0.4 06/16/2022   ALKPHOS 63 06/16/2022   AST 21 06/16/2022   ALT 18 06/16/2022   PROT 7.4 06/16/2022   ALBUMIN 4.4 06/16/2022   CALCIUM 9.2 06/16/2022   GFR 69.59 06/16/2022  Lab Results  Component Value Date   CHOL 162 06/16/2022   Lab Results  Component Value Date   HDL 71.30 06/16/2022   Lab Results  Component Value Date   LDLCALC 77 06/16/2022   Lab Results  Component Value Date   TRIG 71.0 06/16/2022   Lab Results  Component Value Date   CHOLHDL 2 06/16/2022   Lab Results  Component Value Date   HGBA1C 6.0 06/16/2022      Assessment & Plan:  Healthy Lifestyle: Encouraged heart healthy diet, such as the PURE diet, and hydration.   Heel Spurs: Recommended cold compression to manage heel spores. Follow up with Dr. Katrinka Blazing.  Immunizations: Reviewed patient's immunization history.   Labs: A complete set of lab work has been ordered.  GERD: A referral to Dr. Lavon Paganini has been made and Omeprazole 20 mg has been prescribed today. Recommended Famotidine nightly.  Sinus Congestion: Recommended Cetirizine, Montelukast, Fluticasone, and nasal saline to manage sinus congestion. Problem List Items Addressed This Visit     GERD (gastroesophageal reflux disease) - Primary   Relevant Medications   famotidine (PEPCID) 40 MG tablet   omeprazole (PRILOSEC OTC) 20 MG tablet   Other Relevant Orders   Ambulatory referral to Gastroenterology   Osteopenia    Encouraged to get adequate exercise, calcium and vitamin d  intake       Vitamin D deficiency    Supplement and monitor       Relevant Orders   VITAMIN D 25 Hydroxy (Vit-D Deficiency, Fractures)   Hyperlipidemia    encouraged heart healthy diet, avoid trans fats, minimize simple carbs and saturated fats. Increase exercise as tolerated      Muscle cramp    Hydrate and monitor       Hyperglycemia    hgba1c acceptable, minimize simple carbs. Increase exercise as tolerated.      Relevant Orders   Hemoglobin A1c   Essential hypertension    Well controlled, no changes to meds. Encouraged heart healthy diet such as the DASH diet and exercise as tolerated.        Relevant Orders   CBC with Differential/Platelet   Comprehensive metabolic panel   Lipid panel   TSH   Meds ordered this encounter  Medications   famotidine (PEPCID) 40 MG tablet    Sig: Take 1 tablet (40 mg total) by mouth daily.    Dispense:  30 tablet    Refill:  2   omeprazole (PRILOSEC OTC) 20 MG tablet    Sig: Take 1 tablet (20 mg total) by mouth as needed.    Dispense:  30 tablet    Refill:  3   I, Danise Edge, MD, personally preformed the services described in this documentation.  All medical record entries made by the scribe were at my direction and in my presence.  I have reviewed the chart and discharge instructions (if applicable) and agree that the record reflects my personal performance and is accurate and complete. 12/20/2022  I,Mohammed Iqbal,acting as a scribe for Danise Edge, MD.,have documented all relevant documentation on the behalf of Danise Edge, MD,as directed by  Danise Edge, MD while in the presence of Danise Edge, MD.  Danise Edge, MD

## 2022-12-20 NOTE — Patient Instructions (Addendum)
Nasal saline twice daily and as needed  Flonase,  Zyrtec/Cetirizine up to twice daily Astelin nose spray Singulair is a prescription for allergies  Hydrate Hydrate  Dr Tamala Julian  Allergies, Adult An allergy is a condition in which the body's defense system (immune system) comes in contact with an allergen and reacts to it. An allergen is anything that causes an allergic reaction. Allergens cause the immune system to make proteins for fighting infections (antibodies). These antibodies cause cells to release chemicals called histamines that set off the symptoms of an allergic reaction. Allergies often affect the nasal passages (allergic rhinitis), eyes (allergic conjunctivitis), skin (atopic dermatitis), and stomach. Allergies can be mild, moderate, or severe. They cannot spread from person to person. Allergies can develop at any age and may be outgrown. What are the causes? This condition is caused by allergens. Common allergens include: Outdoor allergens, such as pollen, car fumes, and mold. Indoor allergens, such as dust, smoke, mold, and pet dander. Other allergens, such as foods, medicines, scents, insect bites or stings, and other skin irritants. What increases the risk? You are more likely to develop this condition if you have: Family members with allergies. Family members who have any condition that may be caused by allergens, such as asthma. This may make you more likely to have other allergies. What are the signs or symptoms? Symptoms of this condition depend on the severity of the allergy. Mild to moderate symptoms Runny nose, stuffy nose (nasal congestion), or sneezing. Itchy mouth, ears, or throat. A feeling of mucus dripping down the back of your throat (postnasal drip). Sore throat. Itchy, red, watery, or puffy eyes. Skin rash, or itchy, red, swollen areas of skin (hives). Stomach cramps or bloating. Severe symptoms Severe allergies to food, medicine, or insect bites may  cause anaphylaxis, which can be life-threatening. Symptoms include: A red (flushed) face. Wheezing or coughing. Swollen lips, tongue, or mouth. Tight or swollen throat. Chest pain or tightness, or rapid heartbeat. Trouble breathing or shortness of breath. Pain in the abdomen, vomiting, or diarrhea. Dizziness or fainting. How is this diagnosed? This condition is diagnosed based on your symptoms, your family and medical history, and a physical exam. You may also have tests, including: Skin tests to see how your skin reacts to allergens that may be causing your symptoms. Tests include: Skin prick test. For this test, an allergen is introduced to your body through a small opening in the skin. Intradermal skin test. For this test, a small amount of allergen is injected under the first layer of your skin. Patch test. For this test, a small amount of allergen is placed on your skin. The area is covered and then checked after a few days. Blood tests. A challenge test. For this test, you will eat or breathe in a small amount of allergen to see if you have an allergic reaction. You may also be asked to: Keep a food diary. This is a record of all the foods, drinks, and symptoms you have in a day. Try an elimination diet. To do this: Remove certain foods from your diet. Add those foods back one by one to find out if any foods cause an allergic reaction. How is this treated?     Treatment for allergies depends on your symptoms. Treatment may include: Cold, wet cloths (cold compresses) to soothe itching and swelling. Eye drops or nasal sprays. Nasal irrigation to help clear your mucus or keep the nasal passages moist. A humidifier to add moisture to  the air. Skin creams to treat rashes or itching. Oral antihistamines or other medicines to block the reaction or to treat inflammation. Diet changes to remove foods that cause allergies. Being exposed again and again to tiny amounts of allergens to  help you build a defense against it (tolerance). This is called immunotherapy. Examples include: Allergy shot. You receive an injection that contains an allergen. Sublingual immunotherapy. You take a small dose of allergen under your tongue. Emergency injection for anaphylaxis. You give yourself a shot using a syringe (auto-injector) that contains the amount of medicine you need. Your health care provider will teach you how to give yourself an injection. Follow these instructions at home: Medicines  Take or apply over-the-counter and prescription medicines only as told by your health care provider. Always carry your auto-injector pen if you are at risk of anaphylaxis. Give yourself an injection as told by your health care provider. Eating and drinking Follow instructions from your health care provider about eating or drinking restrictions. Drink enough fluid to keep your urine pale yellow. General instructions Wear a medical alert bracelet or necklace to let others know that you have had anaphylaxis before. Avoid known allergens whenever possible. Keep all follow-up visits as told by your health care provider. This is important. Contact a health care provider if: Your symptoms do not get better with treatment. Get help right away if: You have symptoms of anaphylaxis. These include: Swollen mouth, tongue, or throat. Pain or tightness in your chest. Trouble breathing or shortness of breath. Dizziness or fainting. Severe abdominal pain, vomiting, or diarrhea. These symptoms may represent a serious problem that is an emergency. Do not wait to see if the symptoms will go away. Get medical help right away. Call your local emergency services (911 in the [place] Do not drive yourself to the hospital. Summary Take or apply over-the-counter and prescription medicines only as told by your health care provider. Avoid known allergens when possible. Always carry your auto-injector pen if you are at  risk of anaphylaxis. Give yourself an injection as told by your health care provider. Wear a medical alert bracelet or necklace to let others know that you have had anaphylaxis before. Anaphylaxis is a life-threatening emergency. Get help right away. This information is not intended to replace advice given to you by your health care provider. Make sure you discuss any questions you have with your health care provider. Document Revised: 07/20/2020 Document Reviewed: 10/02/2019 Elsevier Patient Education  [place].

## 2022-12-23 ENCOUNTER — Ambulatory Visit: Payer: BC Managed Care – PPO | Admitting: Family Medicine

## 2022-12-23 VITALS — BP 150/84 | HR 73 | Ht 60.0 in | Wt 170.0 lb

## 2022-12-23 DIAGNOSIS — M722 Plantar fascial fibromatosis: Secondary | ICD-10-CM

## 2022-12-23 NOTE — Assessment & Plan Note (Signed)
encouraged heart healthy diet, avoid trans fats, minimize simple carbs and saturated fats. Increase exercise as tolerated 

## 2022-12-23 NOTE — Progress Notes (Signed)
   I, Peterson Lombard, LAT, ATC acting as a scribe for Lynne Leader, MD.  Jenna Johnson is a 67 y.o.female who presents to [place] [facility] today for  who presents to [place] [facility] today for f/u bilat, R>L, plantar fascitis. Pt was last seen by Dr. Georgina Snell on 12/07/22 and was advised to cont HEP and try switching to Tylenol. Today, pt reports she injured the R foot when she was going her HEP on the stairs about 1 week ago. Pt also did a lot of walking when her husband was in the hospital.   Dx imaging: 11/01/22 R Os Calcis XR  Pertinent review of systems: No fevers or chills  Relevant historical information: History of trochanteric bursitis bilaterally   Exam:  BP (!) 150/84   Pulse 73   Ht 5' (1.524 m)   Wt 170 lb (77.1 kg)   SpO2 98%   BMI 33.20 kg/m  General: Well Developed, well nourished, and in no acute distress.   MSK: Right foot normal appearing tender palpation plantar calcaneus       Assessment and Plan: 67 y.o. female with plantar fasciitis right worse than left.  Symptoms are worsening recently that she is increased her activity level with her husband's knee replacement.  Will proceed to shockwave therapy.  She will schedule for that next week.  In the meantime if she is absolutely miserable she can use a cam walker boot on the worst foot.  She can pick 1 up at [facility] supply or similar.    Discussed warning signs or symptoms. Please see discharge instructions. Patient expresses understanding.   The above documentation has been reviewed and is accurate and complete Lynne Leader, M.D.

## 2022-12-23 NOTE — Patient Instructions (Signed)
Thank you for coming in today.   Schedule for Shockwave starting next week.   Consider cam walker boot as needed.   Please go to [facility] supply to get the cam walker boot we talked about today. You may also be able to get it from [employer].

## 2022-12-23 NOTE — Assessment & Plan Note (Signed)
Supplement and monitor 

## 2022-12-23 NOTE — Assessment & Plan Note (Signed)
hgba1c acceptable, minimize simple carbs. Increase exercise as tolerated.  

## 2022-12-23 NOTE — Assessment & Plan Note (Signed)
Well controlled, no changes to meds. Encouraged heart healthy diet such as the DASH diet and exercise as tolerated.  °

## 2022-12-23 NOTE — Assessment & Plan Note (Signed)
Encouraged to get adequate exercise, calcium and vitamin d intake 

## 2022-12-23 NOTE — Assessment & Plan Note (Signed)
Hydrate and monitor 

## 2022-12-27 ENCOUNTER — Ambulatory Visit (INDEPENDENT_AMBULATORY_CARE_PROVIDER_SITE_OTHER): Payer: Self-pay | Admitting: Family Medicine

## 2022-12-27 DIAGNOSIS — M722 Plantar fascial fibromatosis: Secondary | ICD-10-CM

## 2022-12-27 NOTE — Progress Notes (Signed)
   Jenna Johnson [facility] [place] Phone: [phone]   Extracorporeal Shockwave Therapy Note    Patient is being treated today with ECSWT. Informed consent was obtained and patient tolerated procedure well.   Therapy performed by Lynne Leader  Condition treated: Planter fasciitis this and painful heel spurs right foot Treatment preset used: Heel spur Energy used: 120 mJ Frequency used: 10 Hz Number of pulses: 2000 Treatment #1 of #4  Electronically signed by:  Jenna Johnson Sports Medicine 1:12 PM 12/27/22

## 2023-01-03 ENCOUNTER — Ambulatory Visit (INDEPENDENT_AMBULATORY_CARE_PROVIDER_SITE_OTHER): Payer: Self-pay | Admitting: Family Medicine

## 2023-01-03 DIAGNOSIS — M722 Plantar fascial fibromatosis: Secondary | ICD-10-CM

## 2023-01-03 NOTE — Progress Notes (Signed)
   Jenna Johnson [facility] [place] Phone: [phone]   Extracorporeal Shockwave Therapy Note    Patient is being treated today with ECSWT. Informed consent was obtained and patient tolerated procedure well.   Therapy performed by Lynne Leader  Condition treated: Planter fasciitis Treatment preset used: Plantar fascia Energy used: 70 mJ Frequency used: 10 Hz Number of pulses: 2500 Treatment #2 of #4  Electronically signed by:  Jenna Johnson Sports Medicine 3:24 PM 01/03/23

## 2023-01-03 NOTE — Patient Instructions (Signed)
Thank you for coming in today.   Return in 1 week

## 2023-01-10 ENCOUNTER — Encounter: Payer: Self-pay | Admitting: Physician Assistant

## 2023-01-10 ENCOUNTER — Ambulatory Visit: Payer: Self-pay | Admitting: Family Medicine

## 2023-01-10 ENCOUNTER — Ambulatory Visit: Payer: BC Managed Care – PPO | Admitting: Physician Assistant

## 2023-01-10 VITALS — BP 110/70 | HR 76 | Ht 60.0 in | Wt 168.0 lb

## 2023-01-10 DIAGNOSIS — M722 Plantar fascial fibromatosis: Secondary | ICD-10-CM

## 2023-01-10 DIAGNOSIS — K219 Gastro-esophageal reflux disease without esophagitis: Secondary | ICD-10-CM

## 2023-01-10 DIAGNOSIS — K5900 Constipation, unspecified: Secondary | ICD-10-CM

## 2023-01-10 MED ORDER — OMEPRAZOLE 20 MG PO CPDR: 20.0000 mg | DELAYED_RELEASE_CAPSULE | Freq: Every day | ORAL | 0 refills | Status: DC

## 2023-01-10 MED ORDER — OMEPRAZOLE 40 MG PO CPDR: 40.0000 mg | DELAYED_RELEASE_CAPSULE | Freq: Every day | ORAL | 1 refills | Status: DC

## 2023-01-10 NOTE — Progress Notes (Signed)
   Jenna Johnson [facility] [place] Phone: [phone]   Extracorporeal Shockwave Therapy Note    Patient is being treated today with ECSWT. Informed consent was obtained and patient tolerated procedure well.   Therapy performed by Lynne Leader  Condition treated: Planter fasciitis Treatment preset used: Planter fasciitis Energy used: 70 mJ 1500 pulses distal plantar fascia and 90 MJ proximal plantar fascia for 1000 pulses Frequency used: 11 Hz Number of pulses: Total 2500 Treatment #3 of #4  Electronically signed by:  Jenna Johnson Sports Medicine 12:56 PM 01/10/23

## 2023-01-10 NOTE — Patient Instructions (Addendum)
We have sent the following medications to your pharmacy for you to pick up at your convenience:   Start Omeprazole 40 mg take 1 capsule 30-60 minutes before breakfast for 30 days then Start Omeprazole 20 mg take 1 capsule 30-60 minutes before breakfast  daily for 30 days   Discontinue Famotidine    Start drinking more water daily  Follow up in 6-8 weeks   If your blood pressure at your visit was 140/90 or greater, please contact your primary care physician to follow up on this.  _______________________________________________________  If you are age 56 or older, your body mass index should be between 23-30. Your Body mass index is 32.81 kg/m. If this is out of the aforementioned range listed, please consider follow up with your Primary Care Provider.  If you are age 40 or younger, your body mass index should be between 19-25. Your Body mass index is 32.81 kg/m. If this is out of the aformentioned range listed, please consider follow up with your Primary Care Provider.   ________________________________________________________  The [facility] providers would like to encourage you to use [facility] to communicate with providers for non-urgent requests or questions.  Due to long hold times on the telephone, sending your provider a message by [facility] may be a faster and more efficient way to get a response.  Please allow 48 business hours for a response.  Please remember that this is for non-urgent requests.   Thank you for entrusting me with your care and choosing [facility].  Ellouise Newer PA-C

## 2023-01-10 NOTE — Progress Notes (Signed)
Chief Complaint: GERD  HPI:    Jenna Johnson is a  67 y.o AA female, known to Dr. Silverio Decamp, with a past medical history including ASD repair (02/24/2021 echo with LVEF 70-75%) who was referred to me by Mosie Lukes, MD for a complaint of GERD.      05/28/2020 colonoscopy with diverticulosis in the sigmoid and descending colon, nonbleeding internal hemorrhoids and otherwise normal.  Repeat recommended 10 years.    06/16/2022 CBC, CMP, hemoglobin A1c, TSH and vitamin D all normal.    12/20/2022 office visit with primary care provider, reported having pressure on the right side of her chest when eating and laying on her left side her bowel movements are normal, stopped taking Meloxicam 10 days ago.  At that visit started on Famotidine 40 mg daily and Omeprazole 20 mg daily and sent to [place].    Today, the patient presents to clinic accompanied by her husband who assists with her history.  Over the past 6 months apparently the patient has gained about 10 pounds or so which she had presently lost, and has noticed an increase in reflux symptoms.  These are typically after she lays down at night which is about 4 to 5 hours after eating.  She lays down and then feels like the food is still the top of her stomach.  She initially started over-the-counter Omeprazole just once a week and then went to see her PCP who recommended she switch to Famotidine 40 mg nightly.  She uses this periodically but not every night and it helps a little bit.  She has lost 2 pounds again over the past couple of weeks as they are back on her healthy diet after moving.      Describes a hysterectomy that she had around the age of 48 apparently had to have some lysis of adhesions around the same time that were "on my bowel".    Also discusses some feces that look like "rabbit poop", she has since started with prunes and apples and fiber supplement which is helping a little bit.  Admits that she does not like the taste of water and tends to  drink a lot of decaf coffee instead.     Denies fever, chills, dysphagia, blood in her stool, nausea or vomiting.  Past Medical History:  Diagnosis Date   Anemia    long time ago per pt    Arthritis of knee, right    and left knee    Back pain    Bursitis of both hips    Cataract    forming  right eye she thinks    Dermatitis 09/18/2013   Endometriosis    GERD (gastroesophageal reflux disease)    changed diet and better    H/O atrial septal defect    Hypertension    IBS (irritable bowel syndrome)    Lactose intolerance    Myalgia and myositis 09/18/2013   Osteopenia    mild    TIA (transient ischemic attack) 2005    Past Surgical History:  Procedure Laterality Date   ABDOMINAL HYSTERECTOMY  67 yrs old   still has one ovary   APPENDECTOMY  67 yrs old   ASD REPAIR  2006   COLONOSCOPY     10 yr in [place]    hole in heart     RIGHT OOPHORECTOMY     ruptured ovarian cyst at age 56   tia  surgery 2005    Current Outpatient Medications  Medication Sig Dispense Refill   acetaminophen (TYLENOL) 650 MG CR tablet Take 650 mg by mouth every 8 (eight) hours as needed for pain.     amLODipine (NORVASC) 2.5 MG tablet Take 1 tablet (2.5 mg total) by mouth daily. 90 tablet 1   aspirin EC 81 MG tablet Take 1 tablet (81 mg total) by mouth daily.     calcium carbonate (OSCAL) 1500 (600 Ca) MG TABS tablet Take 600 mg of elemental calcium by mouth 2 (two) times daily with a meal. Also includes 59mcg of VitaminD     cholecalciferol (VITAMIN D3) 25 MCG (1000 UT) tablet Take 2,000 Units by mouth daily. Patient taking 3x/week     cycloSPORINE (RESTASIS) 0.05 % ophthalmic emulsion Place 1 drop into both eyes 2 (two) times daily. 0.4 mL    famotidine (PEPCID) 40 MG tablet Take 1 tablet (40 mg total) by mouth daily. 30 tablet 2   omeprazole (PRILOSEC OTC) 20 MG tablet Take 1 tablet (20 mg total) by mouth as needed. (Patient not taking: Reported on 12/23/2022) 30 tablet 3   No current  facility-administered medications for this visit.    Allergies as of 01/10/2023 - Review Complete 12/23/2022  Allergen Reaction Noted   Codeine  11/11/2020    Family History  Problem Relation Age of Onset   ADD / ADHD Mother    Multiple sclerosis Mother    Other Mother        blue tumor, air embolism,   Hypertension Mother    Obesity Mother    Diabetes Father 73       type 2   Other Father        fatty liver, lactose intolerant   Thyroid disease Father    Colon polyps Father    Other Sister        s/p hysterectomy   Other Sister        s/p hysterectomy   Cancer Paternal Grandmother 37       breast   Diabetes Paternal Grandmother    Breast cancer Paternal Grandmother    Colon cancer Neg Hx    Esophageal cancer Neg Hx    Stomach cancer Neg Hx    Rectal cancer Neg Hx     Social History   Socioeconomic History   Marital status: Married    Spouse name: Chiquita Heckert   Number of children: 0   Years of education: Not on file   Highest education level: Not on file  Occupational History   Occupation: Retired  Tobacco Use   Smoking status: Never   Smokeless tobacco: Never  Substance and Sexual Activity   Alcohol use: Yes    Comment: occasionally   Drug use: No   Sexual activity: Yes    Partners: Male  Other Topics Concern   Not on file  Social History Narrative   Not on file   Social Determinants of Health   Financial Resource Strain: Not on file  Food Insecurity: Not on file  Transportation Needs: Not on file  Physical Activity: Not on file  Stress: Not on file  Social Connections: Not on file  Intimate Partner Violence: Not on file    Review of Systems:    Constitutional: No weight loss, fever or chills Cardiovascular: No chest pain  Respiratory: No SOB  Gastrointestinal: See HPI and otherwise negative   Physical Exam:  Vital signs: BP 110/70 (BP Location: Left Arm, Patient Position: Sitting, Cuff Size: Normal)   Pulse 76  Ht 5' (1.524 m)   Wt  168 lb (76.2 kg)   BMI 32.81 kg/m    Constitutional:   Pleasant overweight AA  female appears to be in NAD, Well developed, Well nourished, alert and cooperative Respiratory: Respirations even and unlabored. Lungs clear to auscultation bilaterally.   No wheezes, crackles, or rhonchi.  Cardiovascular: Normal S1, S2. No MRG. Regular rate and rhythm. No peripheral edema, cyanosis or pallor.  Gastrointestinal:  Soft, nondistended, mild epigastric ttp, No rebound or guarding. Normal bowel sounds. No appreciable masses or hepatomegaly. Psychiatric:  Demonstrates good judgement and reason without abnormal affect or behaviors.  RELEVANT LABS AND IMAGING: CBC    Component Value Date/Time   WBC 7.3 06/16/2022 1044   RBC 4.64 06/16/2022 1044   HGB 12.9 06/16/2022 1044   HGB 12.8 08/27/2018 1300   HCT 40.5 06/16/2022 1044   HCT 38.9 08/27/2018 1300   PLT 161.0 06/16/2022 1044   MCV 87.1 06/16/2022 1044   MCV 82 08/27/2018 1300   MCH 26.8 08/27/2018 1300   MCH 27.1 12/26/2013 1617   MCHC 31.8 06/16/2022 1044   RDW 14.2 06/16/2022 1044   RDW 13.1 08/27/2018 1300   LYMPHSABS 1.9 06/03/2021 1135   LYMPHSABS 2.5 08/27/2018 1300   MONOABS 0.5 06/03/2021 1135   EOSABS 0.1 06/03/2021 1135   EOSABS 0.0 08/27/2018 1300   BASOSABS 0.0 06/03/2021 1135   BASOSABS 0.0 08/27/2018 1300    CMP     Component Value Date/Time   NA 140 06/16/2022 1044   NA 139 08/27/2018 1300   K 4.2 06/16/2022 1044   CL 105 06/16/2022 1044   CO2 27 06/16/2022 1044   GLUCOSE 85 06/16/2022 1044   BUN 16 06/16/2022 1044   BUN 12 08/27/2018 1300   CREATININE 0.88 06/16/2022 1044   CREATININE 0.88 12/26/2013 1617   CALCIUM 9.2 06/16/2022 1044   PROT 7.4 06/16/2022 1044   PROT 7.1 08/27/2018 1300   ALBUMIN 4.4 06/16/2022 1044   ALBUMIN 4.4 08/27/2018 1300   AST 21 06/16/2022 1044   ALT 18 06/16/2022 1044   ALKPHOS 63 06/16/2022 1044   BILITOT 0.4 06/16/2022 1044   BILITOT 0.3 08/27/2018 1300   GFRNONAA 78  08/27/2018 1300   GFRAA 90 08/27/2018 1300    Assessment: 1.  GERD: Increased over the past 6 months and is gaining about 10 pounds; likely weight/diet influenced gastritis 2.  Constipation: Small rabbit pellets, not drinking enough water; likely diet/age-related  Plan: 1.  Discussed option of EGD for further evaluation.  Patient would like to try medication first.  Prescribed Omeprazole 40 mg every morning, 30-60 minutes before breakfast.  #30 with 1 refill. 2.  Discussed with patient I would like her to use 40 mg for 1 month and then decrease down to 20 mg for a month.  She can discontinue Pepcid for now. 3.  Reviewed antireflux diet and lifestyle modifications. 4.  If patient continues with symptoms at follow-up will consider an EGD. 5.  Discussed constipation.  It sounds like she is not drinking enough water.  Recommend at least 6-eight 8 ounce glasses of water per day.  Consider adding MiraLAX daily or every other day. 6.  Patient to follow in clinic with me in 6 to 8 weeks or sooner if necessary.  Jenna Newer, PA-C [facility] 01/10/2023, 9:56 AM  Cc: Mosie Lukes, MD

## 2023-01-17 ENCOUNTER — Ambulatory Visit (INDEPENDENT_AMBULATORY_CARE_PROVIDER_SITE_OTHER): Payer: Self-pay | Admitting: Family Medicine

## 2023-01-17 DIAGNOSIS — M722 Plantar fascial fibromatosis: Secondary | ICD-10-CM

## 2023-01-17 NOTE — Progress Notes (Signed)
   Jenna Johnson [facility] [place] Phone: [phone]   Extracorporeal Shockwave Therapy Note    Patient is being treated today with ECSWT. Informed consent was obtained and patient tolerated procedure well.   Therapy performed by Lynne Leader  Condition treated: Planter fasciitis Treatment preset used: Planter fasciitis Energy used: 30 mJ 1500 pulses into the area of maximum tenderness mid arch and 90 mJ 1000 pulses at plantar calcaneus plantar fascia origin Frequency used: 10 Hz Number of pulses: 2500 divided as above Treatment #4 of #4-8  Electronically signed by:  Jenna Johnson Sports Medicine 1:56 PM 01/17/23

## 2023-01-17 NOTE — Patient Instructions (Signed)
Thank you for coming in today.    

## 2023-01-30 ENCOUNTER — Ambulatory Visit (INDEPENDENT_AMBULATORY_CARE_PROVIDER_SITE_OTHER): Payer: Self-pay | Admitting: Family Medicine

## 2023-01-30 DIAGNOSIS — M722 Plantar fascial fibromatosis: Secondary | ICD-10-CM

## 2023-01-30 DIAGNOSIS — S93691S Other sprain of right foot, sequela: Secondary | ICD-10-CM

## 2023-01-30 NOTE — Progress Notes (Signed)
                Larinda Buttery [facility] [place] Phone: [phone]   Extracorporeal Shockwave Therapy Note    Patient is being treated today with ECSWT. Informed consent was obtained and patient tolerated procedure well.   Therapy performed by Lynne Leader  Condition treated: Plantar fasciitis Treatment preset used: Plantar fasciitis Energy used: 90 mJ Frequency used: 10 Hz Number of pulses: 2500 Treatment #5 of #8  Electronically signed by:  Larinda Buttery Sports Medicine 4:08 PM 01/30/23  After discussion she is not improved.  Plan for MRI ankle with midfoot protocol.

## 2023-01-30 NOTE — Patient Instructions (Addendum)
Thank you for coming in today.    

## 2023-01-31 ENCOUNTER — Encounter: Payer: Self-pay | Admitting: Physician Assistant

## 2023-02-15 ENCOUNTER — Ambulatory Visit
Admission: RE | Admit: 2023-02-15 | Discharge: 2023-02-15 | Disposition: A | Discharge status: Home / Self Care | Payer: BC Managed Care – PPO | Source: Ambulatory Visit | Attending: Family Medicine | Admitting: Family Medicine

## 2023-02-15 DIAGNOSIS — S93691S Other sprain of right foot, sequela: Secondary | ICD-10-CM

## 2023-02-19 NOTE — Progress Notes (Signed)
MRI of the ankle shows Planter fasciitis without rupture.  Does show some tendinitis on both sides of the ankle which certainly could happen with limping.  No stress fracture or severe bone marrow edema.  It looks like you have got plantar fasciitis as expected.  If you cannot get better it may be time to consider an injection or surgery. Happy to have you return to clinic to talk about the MRI and full detail and treatment plan and options.  I can recommend a good second opinion/surgical consultation with a good foot and ankle surgeon.

## 2023-02-28 ENCOUNTER — Encounter (INDEPENDENT_AMBULATORY_CARE_PROVIDER_SITE_OTHER): Payer: Self-pay | Admitting: Adult Health

## 2023-02-28 ENCOUNTER — Ambulatory Visit (INDEPENDENT_AMBULATORY_CARE_PROVIDER_SITE_OTHER): Payer: BC Managed Care – PPO | Admitting: Adult Health

## 2023-02-28 VITALS — BP 143/95 | HR 76 | Temp 97.7°F | Ht 61.0 in | Wt 165.0 lb

## 2023-02-28 DIAGNOSIS — E559 Vitamin D deficiency, unspecified: Secondary | ICD-10-CM | POA: Diagnosis not present

## 2023-02-28 DIAGNOSIS — Z6831 Body mass index (BMI) 31.0-31.9, adult: Secondary | ICD-10-CM | POA: Diagnosis not present

## 2023-02-28 DIAGNOSIS — R7303 Prediabetes: Secondary | ICD-10-CM

## 2023-02-28 DIAGNOSIS — E669 Obesity, unspecified: Secondary | ICD-10-CM | POA: Diagnosis not present

## 2023-02-28 NOTE — Progress Notes (Signed)
WEIGHT SUMMARY AND BIOMETRICS  Vitals Temp: 97.7 F (36.5 C) BP: (!) 143/95 Pulse Rate: 76 SpO2: 93 %   Anthropometric Measurements Height: 5\' 1"  (1.549 m) Weight: 165 lb (74.8 kg) BMI (Calculated): 31.19 Weight at Last Visit: 163 Weight Lost Since Last Visit: 0 Starting Weight: 171 Total Weight Loss (lbs): 8 lb (3.629 kg)   Body Composition  Body Fat %: 34.1 % Fat Mass (lbs): 56.4 lbs Muscle Mass (lbs): 103.6 lbs Total Body Water (lbs): 64.6 lbs Visceral Fat Rating : 10   Other Clinical Data Fasting: yes Labs: no Today's Visit #: 82 Starting Date: 08/27/18    Chief Complaint:   OBESITY Jenna Johnson is here to discuss her progress with her obesity treatment plan. She is on the practicing portion control and making smarter food choices, such as increasing vegetables and decreasing simple carbohydrates and states she is following her eating plan approximately 0 % of the time.  She states she is exercising swimming and calisthenics 40 minutes 2 times per week.   Interim History:  Last OV at [facility] is 10/04/2022 Since then she has been seen by her PCP/Dr. Charlett Blake, Sports Med/Dr. Tommi Rumps, and GI/[facility] She has been eating two meals a day, with a portion control/smart choice mindset. She has not been intermittent fasting.  Life Events: Her husband retired July 2023 Moved into retirement home August 2023 They have been travelling back/forth to [place] to visit her father- age 62 in [place]. Her husband had Left Total Knee Replacement 12/12/2022  Reviewed [employer] results with patient: Muscle Mass + 1.6 lbs Adipose Mass - 0.8 lb  Reviewed labs from 06/16/22 and 12/27/21 listed in EPIC.  Recommend checking her metabolism at Next OV.  Of Note- her husband at [facility] during OV  Subjective:   1. Prediabetes Discussed Labs  Latest Reference Range & Units 06/16/22 10:44  Glucose 70 - 99 mg/dL 85  Hemoglobin A1C 4.6 - 6.5 % 6.0     Latest Reference Range & Units 06/03/21 11:36  INSULIN uIU/mL 4.5  Her father has T2D She is not currently on any antidiabetic medications.  2. Vitamin D deficiency Discussed Labs  Latest Reference Range & Units 06/16/22 10:44  VITD 30.00 - 100.00 ng/mL 66.49  She is on OTC Vit D3 1,000 IU daily She endorses stable energy levels.  Assessment/Plan:   1. Prediabetes Limit sugar/CHO intake Strive for 20-25g protein per meal- discussed several examples of foods/meals that would satisfy this requirement.  2. Vitamin D deficiency Continue current OTC supplementation.  3. Obesity, current BMI 31.3  [name] is currently in the action stage of change. As such, her goal is to continue with weight loss efforts. She has agreed to practicing portion control and making smarter food choices, such as increasing vegetables and decreasing simple carbohydrates.   Strive for 20-25g protein per meal.   1 oz meat= 8 g protein 1 oz fish = 5g protein  Exercise goals: All adults should avoid inactivity. Some physical activity is better than none, and adults who participate in any amount of physical activity gain some health benefits. For additional and more extensive health benefits, adults should increase their aerobic physical activity to 300 minutes (5 hours) a week of moderate-intensity, or 150 minutes a week of vigorous-intensity aerobic physical activity, or an equivalent combination of moderate- and vigorous-intensity activity. Additional health benefits are gained by engaging in physical activity beyond this amount.  Adults should also include muscle-strengthening activities that involve  all major muscle groups on 2 or more days a week.  Behavioral modification strategies: increasing lean protein intake, decreasing simple carbohydrates, increasing vegetables, increasing water intake, decreasing sodium intake, increasing high fiber foods, no skipping meals, meal planning and cooking strategies,  keeping healthy foods in the home, and planning for success.  Andilynn has agreed to follow-up with our clinic in 5 months. She was informed of the importance of frequent follow-up visits to maximize her success with intensive lifestyle modifications for her multiple health conditions.   Recommend 40 min f/u OV due to length of time in between [facility] appointments and amount of information that is reviews.  Check IC at next OV- pt aware to arrive 30 min early and fasting at next appt,  Objective:   Blood pressure (!) 143/95, pulse 76, temperature 97.7 F (36.5 C), height 5\' 1"  (1.549 m), weight 165 lb (74.8 kg), SpO2 93 %. Body mass index is 31.18 kg/m.  General: Cooperative, alert, well developed, in no acute distress. HEENT: Conjunctivae and lids unremarkable. Cardiovascular: Regular rhythm.  Lungs: Normal work of breathing. Neurologic: No focal deficits.   Lab Results  Component Value Date   CREATININE 0.88 06/16/2022   BUN 16 06/16/2022   NA 140 06/16/2022   K 4.2 06/16/2022   CL 105 06/16/2022   CO2 27 06/16/2022   Lab Results  Component Value Date   ALT 18 06/16/2022   AST 21 06/16/2022   ALKPHOS 63 06/16/2022   BILITOT 0.4 06/16/2022   Lab Results  Component Value Date   HGBA1C 6.0 06/16/2022   HGBA1C 5.9 12/30/2021   HGBA1C 5.9 06/03/2021   HGBA1C 5.7 06/03/2020   HGBA1C 5.8 10/16/2019   Lab Results  Component Value Date   INSULIN 8.4 08/27/2018   Lab Results  Component Value Date   TSH 1.00 06/16/2022   Lab Results  Component Value Date   CHOL 162 06/16/2022   HDL 71.30 06/16/2022   LDLCALC 77 06/16/2022   TRIG 71.0 06/16/2022   CHOLHDL 2 06/16/2022   Lab Results  Component Value Date   VD25OH 66.49 06/16/2022   VD25OH 67.78 12/30/2021   VD25OH 52.12 06/03/2021   Lab Results  Component Value Date   WBC 7.3 06/16/2022   HGB 12.9 06/16/2022   HCT 40.5 06/16/2022   MCV 87.1 06/16/2022   PLT 161.0 06/16/2022   Lab Results  Component  Value Date   IRON 86 06/03/2021   TIBC 275 06/03/2021   FERRITIN 171 06/03/2021    Attestation Statements:   Reviewed by clinician on day of visit: allergies, medications, problem list, medical history, surgical history, family history, social history, and previous encounter notes.  Time spent on visit including pre-visit chart review and post-visit care and charting was 48 minutes. Lengthy appointment due to follow-up OV every 5 months.  I have reviewed the above documentation for accuracy and completeness, and I agree with the above. -  Marquette Blodgett d. Jamielyn Petrucci, NP-C

## 2023-03-02 NOTE — Progress Notes (Signed)
Shirlyn Goltz, PhD, LAT, ATC acting as a scribe for Lynne Leader, MD.  Jenna Johnson is a 67 y.o. female who presents to [facility] today for f/u bilat foot pain and R ankle MRI review. Pt was last seen by Dr. Georgina Snell on 01/30/23 for her 5th ECSWT and was advised to proceed to R MRI ankle with midfoot protocol. Today, pt reports her R foot is feeling slightly better. She notes increased pain when she pulls her foot behind her when sitting.    She has a wedding she is planning on attending in July.  Dx imaging: 02/15/23 R ankle MRI 11/01/22 R Os [place] XR   Pertinent review of systems: No fevers or chills  Relevant historical information: Hypertension   Exam:  BP (!) 144/92   Pulse 76   Ht 5\' 1"  (1.549 m)   Wt 165 lb (74.8 kg)   SpO2 95%   BMI 31.18 kg/m  General: Well Developed, well nourished, and in no acute distress.   MSK: Right foot normal motion    Lab and Radiology Results  EXAM: MRI OF THE RIGHT ANKLE WITHOUT CONTRAST   TECHNIQUE: Multiplanar, multisequence MR imaging of the ankle was performed. No intravenous contrast was administered.   COMPARISON:  Radiographs dated November 01, 2022   FINDINGS: TENDONS   Peroneal: Peroneal longus tendon intact. Peroneal brevis intact. Mild edema along the peroneus brevis and peroneus longus tendons suggesting tenosynovitis.   Posteromedial: Thickening and edema about the tibialis posterior suggesting tendinosis. Flexor hallucis longus tendon intact. Flexor digitorum longus tendon intact.   Anterior: Tibialis anterior tendon intact. Extensor hallucis longus tendon intact Extensor digitorum longus tendon intact.   Achilles:  Intact.   Plantar Fascia: Edema about the plantar fascia without significant thickening of the plantar fascia concerning for plantar fasciitis, likely a subacute process.   LIGAMENTS   Lateral: Anterior talofibular ligament intact.  Calcaneofibular ligament intact. Posterior talofibular ligament intact. Anterior and posterior tibiofibular ligaments intact.   Medial: Deltoid ligament intact. Spring ligament intact.   CARTILAGE   Ankle Joint: No joint effusion. Normal ankle mortise. No chondral defect.   Subtalar Joints/Sinus Tarsi: Normal subtalar joints. No subtalar joint effusion. Normal sinus tarsi.   Bones: No marrow signal abnormality.  No fracture or dislocation.   Soft Tissue: No fluid collection or hematoma. Muscles are normal without edema or atrophy. Tarsal tunnel is normal.   IMPRESSION: 1. Edema about the plantar fascia without significant thickening concerning for plantar fasciitis, likely a subacute process. 2. Thickening and edema about the distal tibialis posterior suggesting tendinosis. 3. Mild edema along the peroneus brevis and peroneus longus suggesting tenosynovitis. 4. No evidence of fracture or osteonecrosis.     Electronically Signed   By: Keane Police D.O.   On: 02/17/2023 23:21 I, Lynne Leader, personally (independently) visualized and performed the interpretation of the images attached in this note.     Assessment and Plan: 67 y.o. female with right plantar foot pain due to Planter fasciitis.  Discussed options.  She is improving slightly so we can proceed with continued conservative management.  She did have a recent setback which thankfully seems to be resolving.  Target late June for potential injection if needed.  This would be a few weeks before a wedding which would be a good time to do a shot if we need to.  We talked about potential surgical options.  Recommended Dr. Doran Durand Dr. Sharol Given or Dr. Lucia Gaskins.  She already has had an independent recommendation for Dr. Doran Durand so that may be a good first person to consider.  She will let me know how it goes over the next few weeks and months.      Discussed warning signs or symptoms. Please see discharge instructions. Patient expresses  understanding.   The above documentation has been reviewed and is accurate and complete Lynne Leader, M.D.

## 2023-03-06 ENCOUNTER — Ambulatory Visit: Payer: BC Managed Care – PPO | Admitting: Family Medicine

## 2023-03-06 VITALS — BP 144/92 | HR 76 | Ht 61.0 in | Wt 165.0 lb

## 2023-03-06 DIAGNOSIS — M722 Plantar fascial fibromatosis: Secondary | ICD-10-CM | POA: Diagnosis not present

## 2023-03-06 NOTE — Patient Instructions (Signed)
Thank you for coming in today.   We should schedule a shot last week of June if needed.   Remind me to prescribe antianxiety medicine before the shot.   Dr Doran Durand or Dr Lucia Gaskins or Dr Sharol Given would all be good orthopedic choice.

## 2023-03-13 ENCOUNTER — Telehealth: Payer: Self-pay | Admitting: Physician Assistant

## 2023-03-13 MED ORDER — OMEPRAZOLE 20 MG PO CPDR: 20.0000 mg | DELAYED_RELEASE_CAPSULE | Freq: Every day | ORAL | 0 refills | Status: DC

## 2023-03-13 NOTE — Telephone Encounter (Signed)
Patient is calling to make follow up appointment but is requesting Omeprazole refill to hold her off until her appt 6/3. Please advise

## 2023-03-13 NOTE — Telephone Encounter (Signed)
Script sent to pharmacy.

## 2023-03-28 ENCOUNTER — Ambulatory Visit: Payer: BC Managed Care – PPO | Admitting: Family Medicine

## 2023-04-04 ENCOUNTER — Ambulatory Visit: Payer: BC Managed Care – PPO | Admitting: Family Medicine

## 2023-04-14 ENCOUNTER — Other Ambulatory Visit: Payer: Self-pay | Admitting: Family Medicine

## 2023-04-14 DIAGNOSIS — I10 Essential (primary) hypertension: Secondary | ICD-10-CM

## 2023-05-08 ENCOUNTER — Ambulatory Visit: Payer: Self-pay | Admitting: Physician Assistant

## 2023-05-15 ENCOUNTER — Ambulatory Visit: Payer: Medicare PPO | Admitting: Nurse Practitioner

## 2023-05-15 ENCOUNTER — Encounter: Payer: Self-pay | Admitting: Nurse Practitioner

## 2023-05-15 VITALS — BP 108/64 | HR 76 | Ht 60.0 in | Wt 173.0 lb

## 2023-05-15 DIAGNOSIS — K219 Gastro-esophageal reflux disease without esophagitis: Secondary | ICD-10-CM | POA: Diagnosis not present

## 2023-05-15 MED ORDER — OMEPRAZOLE 20 MG PO CPDR: 20.0000 mg | DELAYED_RELEASE_CAPSULE | Freq: Every day | ORAL | 1 refills | Status: DC

## 2023-05-15 NOTE — Progress Notes (Signed)
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 Past hysterectomy. She is known by Dr. Lavon Paganini. She was last seen by Hyacinth Meeker, PA-C in office 01/10/2023 further evaluation regarding mild reflux symptoms.  At that time, she was started on Omeprazole 40 mg once daily for 4 weeks and decreased Omeprazole to 20 mg once daily.  She presents today for further follow-up.  She is accompanied by her husband.  She described her prior reflux symptoms as a mild discomfort to the right of her esophagus area which abated within a month or so after she started taking Omeprazole.  She questions if she needs to continue Omeprazole at this point.  She denies having any dysphagia or heartburn.  No upper or lower abdominal pain.  She is passing normal brown bowel movement most days.  No rectal bleeding or black stools.  She has gained about 10 pounds over the past 6 months which she contributes to having plantar fasciitis which limited her exercise ability.  However, she intends to start swimming soon.  She takes ASA 81 mg daily.  She takes Aleve 1 tab twice monthly or less.  She tries to eat a high-fiber diet. She drinks 2 cups of caffeinated coffee in the morning and 1 decaf coffee later in the day. She drinks several glasses of water daily and and occasionally drinks caffeine free sodas.  She denies ever having an EGD. She underwent a colonoscopy 05/28/2020 which showed diverticulosis to the sigmoid and descending colon, no polyps.  No other complaints at this time.     Latest Ref Rng & Units 06/16/2022   10:44 AM 12/30/2021   11:34 AM 06/03/2021   11:35 AM  CBC  WBC 4.0 - 10.5 K/uL 7.3  6.9  6.3   Hemoglobin 12.0 - 15.0 g/dL 36.6  44.0  34.7   Hematocrit 36.0 - 46.0 % 40.5  41.8  40.3    Platelets 150.0 - 400.0 K/uL 161.0  167.0  176.0        Latest Ref Rng & Units 06/16/2022   10:44 AM 12/30/2021   11:34 AM 06/03/2021   11:35 AM  CMP  Glucose 70 - 99 mg/dL 85  84  89   BUN 6 - 23 mg/dL 16  13  13    Creatinine 0.40 - 1.20 mg/dL 4.25  9.56  3.87   Sodium 135 - 145 mEq/L 140  139  142   Potassium 3.5 - 5.1 mEq/L 4.2  4.2  4.1   Chloride 96 - 112 mEq/L 105  104  105   CO2 19 - 32 mEq/L 27  27  27    Calcium 8.4 - 10.5 mg/dL 9.2  9.5  9.3   Total Protein 6.0 - 8.3 g/dL 7.4  7.8  7.3   Total Bilirubin 0.2 - 1.2 mg/dL 0.4  0.5  0.4   Alkaline Phos 39 - 117 U/L 63  61  64   AST 0 - 37 U/L 21  22  22    ALT 0 - 35 U/L 18  23  19      Current Outpatient Medications on File Prior to Visit  Medication Sig Dispense Refill   acetaminophen (TYLENOL) 650 MG CR tablet Take 650 mg by mouth every 8 (eight) hours  as needed for pain.     amLODipine (NORVASC) 2.5 MG tablet TAKE 1 TABLET(2.5 MG) BY MOUTH DAILY 90 tablet 1   aspirin EC 81 MG tablet Take 1 tablet (81 mg total) by mouth daily.     calcium carbonate (OSCAL) 1500 (600 Ca) MG TABS tablet Take 600 mg of elemental calcium by mouth 2 (two) times daily with a meal. Also includes of VitaminD     cholecalciferol (VITAMIN D3) 25 MCG (1000 UT) tablet Take 2,000 Units by mouth daily. Patient taking 2x/week     cycloSPORINE (RESTASIS) 0.05 % ophthalmic emulsion Place 1 drop into both eyes 2 (two) times daily. 0.4 mL    omeprazole (PRILOSEC) 20 MG capsule Take 1 capsule (20 mg total) by mouth daily. 90 capsule 0   No current facility-administered medications on file prior to visit.   Allergies  Allergen Reactions   Codeine    Current Medications, Allergies, Past Medical History, Past Surgical History, Family History and Social History were reviewed in [employer] record.  Review of Systems:   Constitutional: Negative for fever, sweats, chills or weight loss.  Respiratory: Negative for shortness of breath.    Cardiovascular: Negative for chest pain, palpitations and leg swelling.  Gastrointestinal: See HPI.  Musculoskeletal: + Plantar fasciitis. Neurological: Negative for dizziness, headaches or paresthesias.   Physical Exam: BP 108/64   Pulse 76   Ht 5' (1.524 m)   Wt 173 lb (78.5 kg)   SpO2 97%   BMI 33.79 kg/m   Wt Readings from Last 3 Encounters:  05/15/23 173 lb (78.5 kg)  03/06/23 165 lb (74.8 kg)  02/28/23 165 lb (74.8 kg)    General: 67 year old female in no acute distress. Head: Normocephalic and atraumatic. Eyes: No scleral icterus. Conjunctiva pink . Ears: Normal auditory acuity. Mouth: Dentition intact. No ulcers or lesions.  Lungs: Clear throughout to auscultation. Heart: Regular rate and rhythm, no murmur. Abdomen: Soft, nontender and nondistended. No masses or hepatomegaly. Normal bowel sounds x 4 quadrants.  Rectal: Deferred.  Musculoskeletal: Symmetrical with no gross deformities. Extremities: No edema. Neurological: Alert oriented x 4. No focal deficits.  Psychological: Alert and cooperative. Normal mood and affect  Assessment and Recommendations:  67 year old female with GERD symptoms, significantly improved after she took Omeprazole 40mg  QD  for 4 weeks 01/2023 then dose was decreased to 20 mg daily. -GERD diet reviewed, reduce caffeine intake -Reduce carbohydrates in diet (i.e. reduce bread/pasta/potato/rice/sweets) and exercise as tolerated to lose weight -May reduce Omeprazole 20 mg to once every other day after patient loses 5 to 10 pounds  -Patient to contact office if reflux symptoms recur. -Recommend future EGD if reflux symptoms recur  Colon cancer screening.  Colonoscopy 05/2020 showed diverticulosis, no polyps. -Screening colonoscopy due 05/2030 earlier if symptoms warrant

## 2023-05-15 NOTE — Patient Instructions (Addendum)
Weight loss recommended.  Consider reducing Omeprazole 20 mg to once every other day.  Due to recent changes in healthcare laws, you may see the results of your imaging and laboratory studies on MyChart before your provider has had a chance to review them.  We understand that in some cases there may be results that are confusing or concerning to you. Not all laboratory results come back in the same time frame and the provider may be waiting for multiple results in order to interpret others.  Please give [place] 48 hours in order for your provider to thoroughly review all the results before contacting the office for clarification of your results.   Thank you for trusting me with your gastrointestinal care!   Alcide Evener, CRNP

## 2023-05-18 ENCOUNTER — Other Ambulatory Visit (INDEPENDENT_AMBULATORY_CARE_PROVIDER_SITE_OTHER): Payer: Medicare PPO

## 2023-05-18 DIAGNOSIS — I10 Essential (primary) hypertension: Secondary | ICD-10-CM

## 2023-05-18 DIAGNOSIS — E559 Vitamin D deficiency, unspecified: Secondary | ICD-10-CM | POA: Diagnosis not present

## 2023-05-18 DIAGNOSIS — R739 Hyperglycemia, unspecified: Secondary | ICD-10-CM

## 2023-05-18 NOTE — Progress Notes (Signed)
Patient requested not to be stuck in hand, husband states that's where she normally gets stuck. Patient insisted in using her arms states she did not want to get stuck in hand and only one stick. Stuck patient no success and informed to come back another day. No labs performed.

## 2023-05-19 ENCOUNTER — Other Ambulatory Visit: Payer: Self-pay

## 2023-05-19 DIAGNOSIS — E785 Hyperlipidemia, unspecified: Secondary | ICD-10-CM

## 2023-05-19 DIAGNOSIS — R7303 Prediabetes: Secondary | ICD-10-CM

## 2023-05-19 DIAGNOSIS — E559 Vitamin D deficiency, unspecified: Secondary | ICD-10-CM

## 2023-05-19 DIAGNOSIS — N951 Menopausal and female climacteric states: Secondary | ICD-10-CM

## 2023-05-19 DIAGNOSIS — I10 Essential (primary) hypertension: Secondary | ICD-10-CM

## 2023-05-19 NOTE — Addendum Note (Signed)
Addended by: Maximino Sarin on: 05/19/2023 03:21 PM   Modules accepted: Orders

## 2023-05-24 ENCOUNTER — Other Ambulatory Visit (INDEPENDENT_AMBULATORY_CARE_PROVIDER_SITE_OTHER): Payer: Medicare PPO

## 2023-05-24 DIAGNOSIS — R7303 Prediabetes: Secondary | ICD-10-CM

## 2023-05-24 DIAGNOSIS — E785 Hyperlipidemia, unspecified: Secondary | ICD-10-CM | POA: Diagnosis not present

## 2023-05-24 DIAGNOSIS — N951 Menopausal and female climacteric states: Secondary | ICD-10-CM

## 2023-05-24 DIAGNOSIS — E559 Vitamin D deficiency, unspecified: Secondary | ICD-10-CM | POA: Diagnosis not present

## 2023-05-24 DIAGNOSIS — I10 Essential (primary) hypertension: Secondary | ICD-10-CM

## 2023-05-24 LAB — CBC WITH DIFFERENTIAL/PLATELET
Basophils Absolute: 0 10*3/uL (ref 0.0–0.1)
Basophils Relative: 0.6 % (ref 0.0–3.0)
Eosinophils Absolute: 0.1 10*3/uL (ref 0.0–0.7)
Eosinophils Relative: 1 % (ref 0.0–5.0)
HCT: 39.7 % (ref 36.0–46.0)
Hemoglobin: 12.9 g/dL (ref 12.0–15.0)
Lymphocytes Relative: 29.8 % (ref 12.0–46.0)
Lymphs Abs: 2.4 10*3/uL (ref 0.7–4.0)
MCHC: 32.4 g/dL (ref 30.0–36.0)
MCV: 85.2 fl (ref 78.0–100.0)
Monocytes Absolute: 0.7 10*3/uL (ref 0.1–1.0)
Monocytes Relative: 8 % (ref 3.0–12.0)
Neutro Abs: 5 10*3/uL (ref 1.4–7.7)
Neutrophils Relative %: 60.6 % (ref 43.0–77.0)
Platelets: 164 10*3/uL (ref 150.0–400.0)
RBC: 4.66 Mil/uL (ref 3.87–5.11)
RDW: 14.5 % (ref 11.5–15.5)
WBC: 8.2 10*3/uL (ref 4.0–10.5)

## 2023-05-24 LAB — COMPREHENSIVE METABOLIC PANEL
ALT: 18 U/L (ref 0–35)
AST: 19 U/L (ref 0–37)
Albumin: 4.1 g/dL (ref 3.5–5.2)
Alkaline Phosphatase: 68 U/L (ref 39–117)
BUN: 13 mg/dL (ref 6–23)
CO2: 26 mEq/L (ref 19–32)
Calcium: 9 mg/dL (ref 8.4–10.5)
Chloride: 101 mEq/L (ref 96–112)
Creatinine, Ser: 0.82 mg/dL (ref 0.40–1.20)
GFR: 75.24 mL/min (ref >60.00)
Glucose, Bld: 89 mg/dL (ref 70–99)
Potassium: 4.2 mEq/L (ref 3.5–5.1)
Sodium: 136 mEq/L (ref 135–145)
Total Bilirubin: 0.5 mg/dL (ref 0.2–1.2)
Total Protein: 7.4 g/dL (ref 6.0–8.3)

## 2023-05-24 LAB — LIPID PANEL
Cholesterol: 156 mg/dL (ref 0–200)
HDL: 58.3 mg/dL (ref >39.00)
LDL Cholesterol: 82 mg/dL (ref 0–99)
NonHDL: 98.05
Total CHOL/HDL Ratio: 3
Triglycerides: 78 mg/dL (ref 0.0–149.0)
VLDL: 15.6 mg/dL (ref 0.0–40.0)

## 2023-05-24 LAB — VITAMIN D 25 HYDROXY (VIT D DEFICIENCY, FRACTURES): VITD: 65.59 ng/mL (ref 30.00–100.00)

## 2023-05-24 LAB — TSH: TSH: 1.05 u[IU]/mL (ref 0.35–5.50)

## 2023-05-24 LAB — HEMOGLOBIN A1C: Hgb A1c MFr Bld: 6 % (ref 4.6–6.5)

## 2023-06-05 NOTE — Assessment & Plan Note (Signed)
hgba1c acceptable, minimize simple carbs. Increase exercise as tolerated.  

## 2023-06-05 NOTE — Assessment & Plan Note (Signed)
Encouraged to get adequate exercise, calcium and vitamin d intake 

## 2023-06-05 NOTE — Progress Notes (Unsigned)
Subjective:    Patient ID: Jenna Johnson, female    DOB: 1956/11/09, 67 y.o.   MRN: [id number]  No chief complaint on file.   HPI Discussed the use of AI scribe software for clinical note transcription with the patient, who gave verbal consent to proceed.  History of Present Illness        67 yo female in today for follow up on chronic medical concerns. No recent febrile illness or hospitalizations. Denies CP/palp/SOB/HA/congestion/fevers/GI or GU c/o. Taking meds as prescribed     Past Medical History:  Diagnosis Date  . Anemia    long time ago per pt   . Arthritis of knee, right    and left knee   . Back pain   . Bursitis of both hips   . Cataract    forming  right eye she thinks   . Dermatitis 09/18/2013  . Endometriosis   . GERD (gastroesophageal reflux disease)    changed diet and better   . H/O atrial septal defect   . Hypertension   . IBS (irritable bowel syndrome)   . Lactose intolerance   . Myalgia and myositis 09/18/2013  . Osteopenia    mild   . TIA (transient ischemic attack) 2005    Past Surgical History:  Procedure Laterality Date  . ABDOMINAL HYSTERECTOMY  67 yrs old   still has one ovary  . APPENDECTOMY  67 yrs old  . ASD REPAIR  2006  . COLONOSCOPY     12 yr in [place]   . hole in heart    . RIGHT OOPHORECTOMY     ruptured ovarian cyst at age 23  . tia  surgery 2005    Family History  Problem Relation Age of Onset  . ADD / ADHD Mother   . Multiple sclerosis Mother   . Other Mother        blue tumor, air embolism,  . Hypertension Mother   . Obesity Mother   . Diabetes Father 52       type 2  . Other Father        fatty liver, lactose intolerant  . Thyroid disease Father   . Colon polyps Father   . Other Sister        s/p hysterectomy  . Other Sister        s/p hysterectomy  . Cancer Paternal Grandmother 65       breast  . Diabetes Paternal Grandmother   . Breast cancer Paternal Grandmother   . Colon cancer Neg Hx   .  Esophageal cancer Neg Hx   . Stomach cancer Neg Hx   . Rectal cancer Neg Hx     Social History   Socioeconomic History  . Marital status: Married    Spouse name: Allisia Busbey  . Number of children: 0  . Years of education: Not on file  . Highest education level: Not on file  Occupational History  . Occupation: Retired  Tobacco Use  . Smoking status: Never  . Smokeless tobacco: Never  Vaping Use  . Vaping Use: Never used  Substance and Sexual Activity  . Alcohol use: Yes    Comment: occasionally  . Drug use: No  . Sexual activity: Yes    Partners: Male  Other Topics Concern  . Not on file  Social History Narrative  . Not on file   Social Determinants of Health   Financial Resource Strain: Not on file  Food Insecurity: Not  on file  Transportation Needs: Not on file  Physical Activity: Not on file  Stress: Not on file  Social Connections: Not on file  Intimate Partner Violence: Not on file    Outpatient Medications Prior to Visit  Medication Sig Dispense Refill  . acetaminophen (TYLENOL) 650 MG CR tablet Take 650 mg by mouth every 8 (eight) hours as needed for pain.    Marland Kitchen amLODipine (NORVASC) 2.5 MG tablet TAKE 1 TABLET(2.5 MG) BY MOUTH DAILY 90 tablet 1  . aspirin EC 81 MG tablet Take 1 tablet (81 mg total) by mouth daily.    . calcium carbonate (OSCAL) 1500 (600 Ca) MG TABS tablet Take 600 mg of elemental calcium by mouth 2 (two) times daily with a meal. Also includes of VitaminD    . cholecalciferol (VITAMIN D3) 25 MCG (1000 UT) tablet Take 2,000 Units by mouth daily. Patient taking 2x/week    . cycloSPORINE (RESTASIS) 0.05 % ophthalmic emulsion Place 1 drop into both eyes 2 (two) times daily. 0.4 mL   . omeprazole (PRILOSEC) 20 MG capsule Take 1 capsule (20 mg total) by mouth daily. 90 capsule 0  . omeprazole (PRILOSEC) 20 MG capsule Take 1 capsule (20 mg total) by mouth daily. Take 30 minutes before breakfast. 90 capsule 1   No facility-administered  medications prior to visit.    Allergies  Allergen Reactions  . Codeine     Review of Systems  Constitutional:  Negative for fever and malaise/fatigue.  HENT:  Negative for congestion.   Eyes:  Negative for blurred vision.  Respiratory:  Negative for shortness of breath.   Cardiovascular:  Negative for chest pain, palpitations and leg swelling.  Gastrointestinal:  Negative for abdominal pain, blood in stool and nausea.  Genitourinary:  Negative for dysuria and frequency.  Musculoskeletal:  Negative for falls.  Skin:  Negative for rash.  Neurological:  Negative for dizziness, loss of consciousness and headaches.  Endo/Heme/Allergies:  Negative for environmental allergies.  Psychiatric/Behavioral:  Negative for depression. The patient is not nervous/anxious.       Objective:    Physical Exam Constitutional:      General: She is not in acute distress.    Appearance: Normal appearance. She is well-developed. She is not toxic-appearing.  HENT:     Head: Normocephalic and atraumatic.     Right Ear: External ear normal.     Left Ear: External ear normal.     Nose: Nose normal.  Eyes:     General:        Right eye: No discharge.        Left eye: No discharge.     Conjunctiva/sclera: Conjunctivae normal.  Neck:     Thyroid: No thyromegaly.  Cardiovascular:     Rate and Rhythm: Normal rate and regular rhythm.     Heart sounds: Normal heart sounds. No murmur heard. Pulmonary:     Effort: Pulmonary effort is normal. No respiratory distress.     Breath sounds: Normal breath sounds.  Abdominal:     General: Bowel sounds are normal.     Palpations: Abdomen is soft.     Tenderness: There is no abdominal tenderness. There is no guarding.  Musculoskeletal:        General: Normal range of motion.     Cervical back: Neck supple.  Lymphadenopathy:     Cervical: No cervical adenopathy.  Skin:    General: Skin is warm and dry.  Neurological:     Mental Status:  She is alert and  oriented to person, place, and time.  Psychiatric:        Mood and Affect: Mood normal.        Behavior: Behavior normal.        Thought Content: Thought content normal.        Judgment: Judgment normal.   There were no vitals taken for this visit. Wt Readings from Last 3 Encounters:  05/15/23 173 lb (78.5 kg)  03/06/23 165 lb (74.8 kg)  02/28/23 165 lb (74.8 kg)    Diabetic Foot Exam - Simple   No data filed    Lab Results  Component Value Date   WBC 8.2 05/24/2023   HGB 12.9 05/24/2023   HCT 39.7 05/24/2023   PLT 164.0 05/24/2023   GLUCOSE 89 05/24/2023   CHOL 156 05/24/2023   TRIG 78.0 05/24/2023   HDL 58.30 05/24/2023   LDLCALC 82 05/24/2023   ALT 18 05/24/2023   AST 19 05/24/2023   NA 136 05/24/2023   K 4.2 05/24/2023   CL 101 05/24/2023   CREATININE 0.82 05/24/2023   BUN 13 05/24/2023   CO2 26 05/24/2023   TSH 1.05 05/24/2023   HGBA1C 6.0 05/24/2023    Lab Results  Component Value Date   TSH 1.05 05/24/2023   Lab Results  Component Value Date   WBC 8.2 05/24/2023   HGB 12.9 05/24/2023   HCT 39.7 05/24/2023   MCV 85.2 05/24/2023   PLT 164.0 05/24/2023   Lab Results  Component Value Date   NA 136 05/24/2023   K 4.2 05/24/2023   CO2 26 05/24/2023   GLUCOSE 89 05/24/2023   BUN 13 05/24/2023   CREATININE 0.82 05/24/2023   BILITOT 0.5 05/24/2023   ALKPHOS 68 05/24/2023   AST 19 05/24/2023   ALT 18 05/24/2023   PROT 7.4 05/24/2023   ALBUMIN 4.1 05/24/2023   CALCIUM 9.0 05/24/2023   GFR 75.24 05/24/2023   Lab Results  Component Value Date   CHOL 156 05/24/2023   Lab Results  Component Value Date   HDL 58.30 05/24/2023   Lab Results  Component Value Date   LDLCALC 82 05/24/2023   Lab Results  Component Value Date   TRIG 78.0 05/24/2023   Lab Results  Component Value Date   CHOLHDL 3 05/24/2023   Lab Results  Component Value Date   HGBA1C 6.0 05/24/2023       Assessment & Plan:  Gastroesophageal reflux disease, unspecified  whether esophagitis present Assessment & Plan: Avoid offending foods, start probiotics. Do not eat large meals in late evening and consider raising head of bed.    Osteopenia, unspecified location Assessment & Plan: Encouraged to get adequate exercise, calcium and vitamin d intake    Hyperlipidemia, unspecified hyperlipidemia type Assessment & Plan: encouraged heart healthy diet, avoid trans fats, minimize simple carbs and saturated fats. Increase exercise as tolerated   Muscle cramp Assessment & Plan: Hydrate and monitor    Hyperglycemia Assessment & Plan: hgba1c acceptable, minimize simple carbs. Increase exercise as tolerated.   Essential hypertension Assessment & Plan: Well controlled, no changes to meds. Encouraged heart healthy diet such as the DASH diet and exercise as tolerated.       Assessment and Plan              Danise Edge, MD

## 2023-06-05 NOTE — Assessment & Plan Note (Signed)
encouraged heart healthy diet, avoid trans fats, minimize simple carbs and saturated fats. Increase exercise as tolerated 

## 2023-06-05 NOTE — Assessment & Plan Note (Signed)
Hydrate and monitor 

## 2023-06-05 NOTE — Assessment & Plan Note (Signed)
Well controlled, no changes to meds. Encouraged heart healthy diet such as the DASH diet and exercise as tolerated.  °

## 2023-06-05 NOTE — Assessment & Plan Note (Signed)
Avoid offending foods, start probiotics. Do not eat large meals in late evening and consider raising head of bed.  

## 2023-06-06 ENCOUNTER — Encounter: Payer: Self-pay | Admitting: Family Medicine

## 2023-06-06 ENCOUNTER — Ambulatory Visit: Payer: Medicare PPO | Admitting: Family Medicine

## 2023-06-06 VITALS — BP 116/70 | HR 65 | Temp 97.6°F | Resp 12 | Ht 60.0 in | Wt 172.6 lb

## 2023-06-06 DIAGNOSIS — E785 Hyperlipidemia, unspecified: Secondary | ICD-10-CM | POA: Diagnosis not present

## 2023-06-06 DIAGNOSIS — M8580 Other specified disorders of bone density and structure, unspecified site: Secondary | ICD-10-CM

## 2023-06-06 DIAGNOSIS — K219 Gastro-esophageal reflux disease without esophagitis: Secondary | ICD-10-CM | POA: Diagnosis not present

## 2023-06-06 DIAGNOSIS — R252 Cramp and spasm: Secondary | ICD-10-CM | POA: Diagnosis not present

## 2023-06-06 DIAGNOSIS — E559 Vitamin D deficiency, unspecified: Secondary | ICD-10-CM

## 2023-06-06 DIAGNOSIS — R739 Hyperglycemia, unspecified: Secondary | ICD-10-CM

## 2023-06-06 DIAGNOSIS — I10 Essential (primary) hypertension: Secondary | ICD-10-CM

## 2023-06-06 NOTE — Patient Instructions (Signed)
Lidocaine gel 4% OTC, Aspercreme, Icy Hot, Salon Pas  Plantar Fasciitis  Plantar fasciitis is a painful foot condition that affects the heel. It occurs when the band of tissue that connects the toes to the heel bone (plantar fascia) becomes irritated. This can happen as the result of exercising too much or doing other repetitive activities (overuse injury). Plantar fasciitis can cause mild irritation to severe pain that makes it difficult to walk or move. The pain is usually worse in the morning after sleeping, or after sitting or lying down for a period of time. Pain may also be worse after long periods of walking or standing. What are the causes? This condition may be caused by: Standing for long periods of time. Wearing shoes that do not have good arch support. Doing activities that put stress on joints (high-impact activities). This includes ballet and exercise that makes your heart beat faster (aerobic exercise), such as running. Being overweight. An abnormal way of walking (gait). Tight muscles in the back of your lower leg (calf). High arches in your feet or flat feet. Starting a new athletic activity. What are the signs or symptoms? The main symptom of this condition is heel pain. Pain may get worse after the following: Taking the first steps after a time of rest, especially in the morning after awakening, or after you have been sitting or lying down for a while. Long periods of standing still. Pain may decrease after 30-45 minutes of activity, such as gentle walking. How is this diagnosed? This condition may be diagnosed based on your medical history, a physical exam, and your symptoms. Your health care provider will check for: A tender area on the bottom of your foot. A high arch in your foot or flat feet. Pain when you move your foot. Difficulty moving your foot. You may have imaging tests to confirm the diagnosis, such as: X-rays. Ultrasound. MRI. How is this  treated? Treatment for plantar fasciitis depends on how severe your condition is. Treatment may include: Rest, ice, pressure (compression), and raising (elevating) the affected foot. This is called RICE therapy. Your health care provider may recommend RICE therapy along with over-the-counter pain medicines to manage your pain. Exercises to stretch your calves and your plantar fascia. A splint that holds your foot in a stretched, upward position while you sleep (night splint). Physical therapy to relieve symptoms and prevent problems in the future. Injections of steroid medicine (cortisone) to relieve pain and inflammation. Stimulating your plantar fascia with electrical impulses (extracorporeal shock wave therapy). This is usually the last treatment option before surgery. Surgery, if other treatments have not worked after 12 months. Follow these instructions at home: Managing pain, stiffness, and swelling  If directed, put ice on the painful area. To do this: Put ice in a plastic bag, or use a frozen bottle of water. Place a towel between your skin and the bag or bottle. Roll the bottom of your foot over the bag or bottle. Do this for 20 minutes, 2-3 times a day. Wear athletic shoes that have air-sole or gel-sole cushions, or try soft shoe inserts that are designed for plantar fasciitis. Elevate your foot above the level of your heart while you are sitting or lying down. Activity Avoid activities that cause pain. Ask your health care provider what activities are safe for you. Do physical therapy exercises and stretches as told by your health care provider. Try activities and forms of exercise that are easier on your joints (low impact). Examples  include swimming, water aerobics, and biking. General instructions Take over-the-counter and prescription medicines only as told by your health care provider. Wear a night splint while sleeping, if told by your health care provider. Loosen the splint  if your toes tingle, become numb, or turn cold and blue. Maintain a healthy weight, or work with your health care provider to lose weight as needed. Keep all follow-up visits. This is important. Contact a health care provider if you have: Symptoms that do not go away with home treatment. Pain that gets worse. Pain that affects your ability to move or do daily activities. Summary Plantar fasciitis is a painful foot condition that affects the heel. It occurs when the band of tissue that connects the toes to the heel bone (plantar fascia) becomes irritated. Heel pain is the main symptom of this condition. It may get worse after exercising too much or standing still for a long time. Treatment varies, but it usually starts with rest, ice, pressure (compression), and raising (elevating) the affected foot. This is called RICE therapy. Over-the-counter medicines can also be used to manage pain. This information is not intended to replace advice given to you by your health care provider. Make sure you discuss any questions you have with your health care provider. Document Revised: 03/09/2020 Document Reviewed: 03/09/2020 Elsevier Patient Education  2024 [employer].

## 2023-06-06 NOTE — Assessment & Plan Note (Addendum)
Supplement and monitor 

## 2023-06-20 ENCOUNTER — Other Ambulatory Visit: Payer: Self-pay

## 2023-07-24 ENCOUNTER — Other Ambulatory Visit: Payer: Self-pay | Admitting: Family Medicine

## 2023-07-24 DIAGNOSIS — Z1231 Encounter for screening mammogram for malignant neoplasm of breast: Secondary | ICD-10-CM

## 2023-07-25 ENCOUNTER — Encounter (INDEPENDENT_AMBULATORY_CARE_PROVIDER_SITE_OTHER): Payer: Self-pay | Admitting: Adult Health

## 2023-07-25 ENCOUNTER — Ambulatory Visit (INDEPENDENT_AMBULATORY_CARE_PROVIDER_SITE_OTHER): Payer: Medicare PPO | Admitting: Adult Health

## 2023-07-25 VITALS — BP 143/80 | HR 66 | Temp 98.2°F | Ht 60.0 in | Wt 167.0 lb

## 2023-07-25 DIAGNOSIS — R739 Hyperglycemia, unspecified: Secondary | ICD-10-CM | POA: Diagnosis not present

## 2023-07-25 DIAGNOSIS — Z6832 Body mass index (BMI) 32.0-32.9, adult: Secondary | ICD-10-CM

## 2023-07-25 DIAGNOSIS — E669 Obesity, unspecified: Secondary | ICD-10-CM

## 2023-07-25 DIAGNOSIS — E559 Vitamin D deficiency, unspecified: Secondary | ICD-10-CM

## 2023-07-25 DIAGNOSIS — I10 Essential (primary) hypertension: Secondary | ICD-10-CM | POA: Diagnosis not present

## 2023-07-25 DIAGNOSIS — R0602 Shortness of breath: Secondary | ICD-10-CM | POA: Diagnosis not present

## 2023-07-25 NOTE — Progress Notes (Signed)
WEIGHT SUMMARY AND BIOMETRICS  Vitals Temp: 98.2 F (36.8 C) BP: (!) 143/80 Pulse Rate: 66 SpO2: 100 %   Anthropometric Measurements Height: 5' (1.524 m) Weight: 167 lb (75.8 kg) BMI (Calculated): 32.62 Weight at Last Visit: 165lb Weight Lost Since Last Visit: 0 Weight Gained Since Last Visit: 2lb Starting Weight: 171lb Total Weight Loss (lbs): 6 lb (2.722 kg)   Body Composition  Body Fat %: 35.4 % Fat Mass (lbs): 59.2 lbs Muscle Mass (lbs): 102.8 lbs Total Body Water (lbs): 66.6 lbs Visceral Fat Rating : 10   Other Clinical Data RMR: 1138 Fasting: yes Labs: yes Today's Visit #: 37 Starting Date: 08/27/18    Chief Complaint:   OBESITY Jenna Johnson is here to discuss her progress with her obesity treatment plan. She is on the practicing portion control and making smarter food choices, such as increasing vegetables and decreasing simple carbohydrates and states she is following her eating plan approximately 0 % of the time. She states she is exercising Swimming 45-60 minutes 2-4 times per week.   Interim History:  She is being followed by [facility] every 5-6 months per pt's request  06/06/2023- PCP OV with labs  She continues to be closely followed by [facility] for chronic bilateral plantar fasciitis  Her husband underwent Discectomy 03/23/2023 and L total knee replacement 05/13/2023  Her 89 year father was placed in [facility] (he lives in [place]) March 2023  She and her husband have travelled to [place] twice over the summer- will be home the next several months.  Her current weight: 167 lbs with corresponding BMI 31.6 New goal is to lose down into 150s by Feb 2025- 2 week trip to [place]   Subjective:   1. SOB (shortness of breath) on exertion She endorses dyspnea with exertions, denies CP  08/27/18 09:00  RMR 1556  Waist Measurement  38 inches    09/24/19 07:00  RMR 1382    07/25/23  RMR 1138   Metabolism decreased and slower than  expected  2. Essential hypertension BP above goal at OV She denies CP with exertion  3. Vitamin D deficiency  Latest Reference Range & Units 06/16/22 10:44 05/24/23 09:57  VITD 30.00 - 100.00 ng/mL 66.49 65.59   She is currently on OTC Vit D3 5,000 international units 2 x week  Previously on Ergocalciferol per MAR review - last use early 2020  4. Hyperglycemia  Latest Reference Range & Units 12/30/21 11:34 06/16/22 10:44 05/24/23 09:57  Glucose 70 - 99 mg/dL 84 85 89   Lab Results  Component Value Date   HGBA1C 6.0 05/24/2023   HGBA1C 6.0 06/16/2022   HGBA1C 5.9 12/30/2021   She is not currently on any antidiabetic medications She endorses lower appetite levels. Her father is a diabetic  Assessment/Plan:   1. SOB (shortness of breath) on exertion Increase daily protein, at least 20g per meal  2. Essential hypertension Limit Na+ intake  3. Vitamin D deficiency Continue OTC Vit D3 5,000 international units  twice weekly  4. Hyperglycemia Limit simple CHO and increase daily protein  5. Obesity, current BMI 32.62  Hazleigh is not currently in the action stage of change. As such, her goal is to get back to weightloss efforts . She has agreed to practicing portion control and making smarter food choices, such as increasing vegetables and decreasing simple carbohydrates.  At least 20g protein per meal  Exercise goals: Older adults should follow the adult guidelines. When older adults  cannot meet the adult guidelines, they should be as physically active as their abilities and conditions will allow.  Older adults should do exercises that maintain or improve balance if they are at risk of falling.  Older adults should determine their level of effort for physical activity relative to their level of fitness.  Older adults with chronic conditions should understand whether and how their conditions affect their ability to do regular physical activity safely.  Behavioral  modification strategies: increasing lean protein intake, decreasing simple carbohydrates, increasing vegetables, increasing water intake, no skipping meals, meal planning and cooking strategies, keeping healthy foods in the home, and planning for success.  Kerah has agreed to follow-up with our clinic in 5 Months  She was informed of the importance of frequent follow-up visits to maximize her success with intensive lifestyle modifications for her multiple health conditions.   Objective:   Blood pressure (!) 143/80, pulse 66, temperature 98.2 F (36.8 C), height 5' (1.524 m), weight 167 lb (75.8 kg), SpO2 100%. Body mass index is 32.61 kg/m.  General: Cooperative, alert, well developed, in no acute distress. HEENT: Conjunctivae and lids unremarkable. Cardiovascular: Regular rhythm.  Lungs: Normal work of breathing. Neurologic: No focal deficits.   Lab Results  Component Value Date   CREATININE 0.82 05/24/2023   BUN 13 05/24/2023   NA 136 05/24/2023   K 4.2 05/24/2023   CL 101 05/24/2023   CO2 26 05/24/2023   Lab Results  Component Value Date   ALT 18 05/24/2023   AST 19 05/24/2023   ALKPHOS 68 05/24/2023   BILITOT 0.5 05/24/2023   Lab Results  Component Value Date   HGBA1C 6.0 05/24/2023   HGBA1C 6.0 06/16/2022   HGBA1C 5.9 12/30/2021   HGBA1C 5.9 06/03/2021   HGBA1C 5.7 06/03/2020   Lab Results  Component Value Date   INSULIN 8.4 08/27/2018   Lab Results  Component Value Date   TSH 1.05 05/24/2023   Lab Results  Component Value Date   CHOL 156 05/24/2023   HDL 58.30 05/24/2023   LDLCALC 82 05/24/2023   TRIG 78.0 05/24/2023   CHOLHDL 3 05/24/2023   Lab Results  Component Value Date   VD25OH 65.59 05/24/2023   VD25OH 66.49 06/16/2022   VD25OH 67.78 12/30/2021   Lab Results  Component Value Date   WBC 8.2 05/24/2023   HGB 12.9 05/24/2023   HCT 39.7 05/24/2023   MCV 85.2 05/24/2023   PLT 164.0 05/24/2023   Lab Results  Component Value Date    IRON 86 06/03/2021   TIBC 275 06/03/2021   FERRITIN 171 06/03/2021   Attestation Statements:   Reviewed by clinician on day of visit: allergies, medications, problem list, medical history, surgical history, family history, social history, and previous encounter notes.  Due to length in between f/u visits, recommend 50 min appt visits due to number/level of in depth questions.  I have reviewed the above documentation for accuracy and completeness, and I agree with the above. -  Jacquline Terrill d. Oceanna Arruda, NP-C

## 2023-07-31 ENCOUNTER — Other Ambulatory Visit: Payer: Self-pay | Admitting: Family Medicine

## 2023-07-31 DIAGNOSIS — I10 Essential (primary) hypertension: Secondary | ICD-10-CM

## 2023-08-02 ENCOUNTER — Encounter: Payer: Self-pay | Admitting: Family Medicine

## 2023-08-03 ENCOUNTER — Encounter: Payer: Self-pay | Admitting: Family Medicine

## 2023-08-03 ENCOUNTER — Ambulatory Visit: Payer: Medicare PPO | Admitting: Family Medicine

## 2023-08-03 ENCOUNTER — Other Ambulatory Visit (HOSPITAL_COMMUNITY)
Admission: RE | Admit: 2023-08-03 | Discharge: 2023-08-03 | Disposition: A | Discharge status: Home / Self Care | Payer: Medicare PPO | Source: Ambulatory Visit | Attending: Family Medicine | Admitting: Family Medicine

## 2023-08-03 VITALS — BP 126/55 | HR 75 | Ht 60.0 in | Wt 174.0 lb

## 2023-08-03 DIAGNOSIS — L299 Pruritus, unspecified: Secondary | ICD-10-CM | POA: Diagnosis not present

## 2023-08-03 DIAGNOSIS — N898 Other specified noninflammatory disorders of vagina: Secondary | ICD-10-CM | POA: Diagnosis present

## 2023-08-03 NOTE — Progress Notes (Signed)
Acute Office Visit  Subjective:     Patient ID: Jenna Johnson, female    DOB: [name] 22, 1956, 67 y.o.   MRN: [id number]  Chief Complaint  Patient presents with   Pruritis    HPI Patient is in today for itchy skin and vaginal itching.   Patient states that for the past several months she has been struggling with itching. States it starts with vaginal itching (she has tried monistat with minimal relief; but full body is now itching. She has been monitoring her symptoms and is pretty sure that the itching is related to swimming. States that for the past few months, she has noticed vaginal and full body itching almost immediately after getting out of the fitness club pool. She will immediately take a shower and apply moisturizer, but the itching will last for a day or two before resolving and then return the next time she swims at that pool. States that at one point she was in [place] this summer and went to a [employer] pool there and did not have any symptoms afterwards. And for a brief period she did not swim at all for a few weeks and cannot recall any itching during that time period. States she usually cannot see any sort of rash, but the itching is head to toe. She has not noticed her skin being any more dry than usual.  She would like to be sure she doesn't have a yeast/BV infection in addition.       ROS All review of systems negative except what is listed in the HPI      Objective:    BP (!) 126/55   Pulse 75   Ht 5' (1.524 m)   Wt 174 lb (78.9 kg)   SpO2 97%   BMI 33.98 kg/m    Physical Exam Vitals reviewed.  Constitutional:      Appearance: Normal appearance.  Skin:    General: Skin is warm and dry.     Findings: No bruising, erythema, lesion or rash.  Neurological:     General: No focal deficit present.     Mental Status: She is alert and oriented to person, place, and time. Mental status is at baseline.  Psychiatric:        Mood and Affect: Mood normal.         Behavior: Behavior normal.        Thought Content: Thought content normal.        Judgment: Judgment normal.     No results found for any visits on 08/03/23.      Assessment & Plan:   Problem List Items Addressed This Visit   None Visit Diagnoses     Vaginal itching    -  Primary   Relevant Orders   Cervicovaginal ancillary only   Itching         Vaginal swab for yeast/BV.  No visible rash or skin changes on the body.  Suspect possible irritant dermatitis from the pool chemicals. Suggest trying a daily antihistamine to see if that helps with any symptoms. Continue supportive measures - showering before and after entering the pool and applying a good moisturizer regularly. If not, do a trial of avoiding exposure, then reintroducing to confirm the cause - keep symptoms diary. We could also consider allergy testing, but she has not noticed any other environmental or food concerns.  Patient aware of signs/symptoms requiring further/urgent evaluation.   No orders of the defined types were placed in this  encounter.   Return if symptoms worsen or fail to improve.  Clayborne Dana, NP

## 2023-08-10 LAB — CERVICOVAGINAL ANCILLARY ONLY
Bacterial Vaginitis (gardnerella): NEGATIVE
Candida Glabrata: NEGATIVE
Candida Vaginitis: NEGATIVE
Comment: NEGATIVE
Comment: NEGATIVE
Comment: NEGATIVE

## 2023-08-23 ENCOUNTER — Ambulatory Visit: Payer: Medicare PPO

## 2023-09-05 ENCOUNTER — Ambulatory Visit
Admission: RE | Admit: 2023-09-05 | Discharge: 2023-09-05 | Disposition: A | Discharge status: Home / Self Care | Payer: Medicare PPO | Source: Ambulatory Visit | Attending: Family Medicine | Admitting: Family Medicine

## 2023-09-05 ENCOUNTER — Ambulatory Visit (INDEPENDENT_AMBULATORY_CARE_PROVIDER_SITE_OTHER): Payer: Medicare PPO | Admitting: Family Medicine

## 2023-09-05 ENCOUNTER — Encounter: Payer: Self-pay | Admitting: Family Medicine

## 2023-09-05 VITALS — BP 135/57 | HR 73 | Ht 60.0 in | Wt 173.0 lb

## 2023-09-05 DIAGNOSIS — R739 Hyperglycemia, unspecified: Secondary | ICD-10-CM | POA: Diagnosis not present

## 2023-09-05 DIAGNOSIS — Z1231 Encounter for screening mammogram for malignant neoplasm of breast: Secondary | ICD-10-CM

## 2023-09-05 DIAGNOSIS — L299 Pruritus, unspecified: Secondary | ICD-10-CM

## 2023-09-05 LAB — CBC WITH DIFFERENTIAL/PLATELET
Basophils Absolute: 0 10*3/uL (ref 0.0–0.1)
Basophils Relative: 0.3 % (ref 0.0–3.0)
Eosinophils Absolute: 0.1 10*3/uL (ref 0.0–0.7)
Eosinophils Relative: 0.8 % (ref 0.0–5.0)
HCT: 41.8 % (ref 36.0–46.0)
Hemoglobin: 13.2 g/dL (ref 12.0–15.0)
Lymphocytes Relative: 30 % (ref 12.0–46.0)
Lymphs Abs: 2.5 10*3/uL (ref 0.7–4.0)
MCHC: 31.6 g/dL (ref 30.0–36.0)
MCV: 85.5 fL (ref 78.0–100.0)
Monocytes Absolute: 0.7 10*3/uL (ref 0.1–1.0)
Monocytes Relative: 8.5 % (ref 3.0–12.0)
Neutro Abs: 5 10*3/uL (ref 1.4–7.7)
Neutrophils Relative %: 60.4 % (ref 43.0–77.0)
Platelets: 176 10*3/uL (ref 150.0–400.0)
RBC: 4.89 Mil/uL (ref 3.87–5.11)
RDW: 14.3 % (ref 11.5–15.5)
WBC: 8.2 10*3/uL (ref 4.0–10.5)

## 2023-09-05 LAB — COMPREHENSIVE METABOLIC PANEL
ALT: 18 U/L (ref 0–35)
AST: 17 U/L (ref 0–37)
Albumin: 4.1 g/dL (ref 3.5–5.2)
Alkaline Phosphatase: 81 U/L (ref 39–117)
BUN: 17 mg/dL (ref 6–23)
CO2: 26 meq/L (ref 19–32)
Calcium: 9.4 mg/dL (ref 8.4–10.5)
Chloride: 105 meq/L (ref 96–112)
Creatinine, Ser: 1.02 mg/dL (ref 0.40–1.20)
GFR: 57.79 mL/min — ABNORMAL LOW (ref >60.00)
Glucose, Bld: 95 mg/dL (ref 70–99)
Potassium: 4 meq/L (ref 3.5–5.1)
Sodium: 139 meq/L (ref 135–145)
Total Bilirubin: 0.4 mg/dL (ref 0.2–1.2)
Total Protein: 7.3 g/dL (ref 6.0–8.3)

## 2023-09-05 LAB — TSH: TSH: 1 u[IU]/mL (ref 0.35–5.50)

## 2023-09-05 LAB — HEMOGLOBIN A1C: Hgb A1c MFr Bld: 6.2 % (ref 4.6–6.5)

## 2023-09-05 NOTE — Progress Notes (Signed)
Acute Office Visit  Subjective:     Patient ID: MARGARIE MCGUIRT, female    DOB: 12/24/1955, 67 y.o.   MRN: [id number]  Chief Complaint  Patient presents with   Pruritis    HPI Patient is in today for itching.   Discussed the use of AI scribe software for clinical note transcription with the patient, who gave verbal consent to proceed.  History of Present Illness   The patient presents with a persistent, generalized pruritus, described as a 'tingly itch' that is experienced daily, particularly when the effects of her Benadryl medication wear off. The itching is not associated with any noticeable rash, but the patient has scratched a sore on her back due to the intensity of the itch. The patient denies any changes in diet, laundry detergent, skincare routine, or new exposures that could potentially trigger the itching.  The patient has been using a steroid cream (mometasone) on a specific itchy area to the neck, which has shown significant improvement. However, the itching persists in other areas. The patient has also been using Ponds cream for skin hydration, which she reports as effective.  . The patient has not noticed any significant changes in bowel movements or urinary patterns.   The patient has been trying to stay hydrated and has not noticed any changes in her skin condition, such as dryness or flakiness, except for one spot that was treated with the steroid cream. The patient has been swimming recently, but the itching persisted even after abstaining from swimming for over a week.              ROS All review of systems negative except what is listed in the HPI      Objective:    BP (!) 135/57   Pulse 73   Ht 5' (1.524 m)   Wt 173 lb (78.5 kg)   SpO2 99%   BMI 33.79 kg/m    Physical Exam Vitals reviewed.  Constitutional:      Appearance: Normal appearance.  Cardiovascular:     Rate and Rhythm: Normal rate and regular rhythm.  Pulmonary:      Effort: Pulmonary effort is normal.     Breath sounds: Normal breath sounds.  Musculoskeletal:     Cervical back: Normal range of motion and neck supple.  Skin:    General: Skin is warm and dry.     Findings: No bruising, erythema, lesion or rash.  Neurological:     General: No focal deficit present.     Mental Status: She is alert and oriented to person, place, and time. Mental status is at baseline.  Psychiatric:        Mood and Affect: Mood normal.        Behavior: Behavior normal.        Thought Content: Thought content normal.        Judgment: Judgment normal.     No results found for any visits on 09/05/23.      Assessment & Plan:   Problem List Items Addressed This Visit       Active Problems   Hyperglycemia   Other Visit Diagnoses     Generalized pruritus    -  Primary   Relevant Orders   CBC with Differential/Platelet   Comprehensive metabolic panel   TSH   Hemoglobin A1c         Generalized Pruritus Daily itching without rash, not associated with new exposures or medications. Mometasone cream provided relief for  localized itching. No signs of dry skin or other skin abnormalities on examination. -Order comprehensive blood work including CBC, metabolic panel, thyroid function tests, and A1c to investigate potential systemic causes. -Recommend daily use of emollient (CeraVe, Eucerin, Aquaphor) to trap in moisture. -Switch from Benadryl to Zyrtec daily due to potential long-term side effects of Benadryl. -Add Pepcid morning and night to address potential histamine component. -Consider trial of gabapentin if no cause identified and symptoms persist. -Consider dermatology referral for potential skin biopsy if new rashes develop.        No orders of the defined types were placed in this encounter.   Return if symptoms worsen or fail to improve, for (pending labs).  Clayborne Dana, NP

## 2023-09-07 NOTE — Progress Notes (Signed)
Your labs are stable. No obvious findings to explain your itching. I discussed with Dr. Abner Greenspan she agrees we try histamine blocking - Zyrtec 10 mg morning and night, Pepcid 20 mg morning and night. Continue this plan for at least a month and avoid the pool during this time just in case you are having some sort of delayed reaction. If after a month trial of this we are not improving, we can try dermatology or allergist referral to further evaluate.

## 2023-10-23 ENCOUNTER — Other Ambulatory Visit: Payer: Self-pay | Admitting: Family Medicine

## 2023-10-23 DIAGNOSIS — I10 Essential (primary) hypertension: Secondary | ICD-10-CM

## 2023-11-07 ENCOUNTER — Other Ambulatory Visit: Payer: Self-pay | Admitting: Nurse Practitioner

## 2023-11-07 NOTE — Telephone Encounter (Signed)
Entry error

## 2023-12-08 ENCOUNTER — Other Ambulatory Visit: Payer: Medicare PPO

## 2023-12-19 ENCOUNTER — Other Ambulatory Visit (INDEPENDENT_AMBULATORY_CARE_PROVIDER_SITE_OTHER): Payer: Medicare PPO

## 2023-12-19 DIAGNOSIS — I10 Essential (primary) hypertension: Secondary | ICD-10-CM

## 2023-12-19 DIAGNOSIS — R252 Cramp and spasm: Secondary | ICD-10-CM

## 2023-12-19 DIAGNOSIS — E785 Hyperlipidemia, unspecified: Secondary | ICD-10-CM

## 2023-12-19 DIAGNOSIS — R739 Hyperglycemia, unspecified: Secondary | ICD-10-CM | POA: Diagnosis not present

## 2023-12-19 DIAGNOSIS — E559 Vitamin D deficiency, unspecified: Secondary | ICD-10-CM

## 2023-12-19 LAB — CBC WITH DIFFERENTIAL/PLATELET
Basophils Absolute: 0 10*3/uL (ref 0.0–0.1)
Basophils Relative: 0.5 % (ref 0.0–3.0)
Eosinophils Absolute: 0.1 10*3/uL (ref 0.0–0.7)
Eosinophils Relative: 0.7 % (ref 0.0–5.0)
HCT: 43.5 % (ref 36.0–46.0)
Hemoglobin: 14 g/dL (ref 12.0–15.0)
Lymphocytes Relative: 33 % (ref 12.0–46.0)
Lymphs Abs: 2.8 10*3/uL (ref 0.7–4.0)
MCHC: 32.2 g/dL (ref 30.0–36.0)
MCV: 86.7 fL (ref 78.0–100.0)
Monocytes Absolute: 0.7 10*3/uL (ref 0.1–1.0)
Monocytes Relative: 8.4 % (ref 3.0–12.0)
Neutro Abs: 4.8 10*3/uL (ref 1.4–7.7)
Neutrophils Relative %: 57.4 % (ref 43.0–77.0)
Platelets: 179 10*3/uL (ref 150.0–400.0)
RBC: 5.02 Mil/uL (ref 3.87–5.11)
RDW: 14.3 % (ref 11.5–15.5)
WBC: 8.4 10*3/uL (ref 4.0–10.5)

## 2023-12-19 LAB — COMPREHENSIVE METABOLIC PANEL
ALT: 16 U/L (ref 0–35)
AST: 18 U/L (ref 0–37)
Albumin: 4.6 g/dL (ref 3.5–5.2)
Alkaline Phosphatase: 71 U/L (ref 39–117)
BUN: 15 mg/dL (ref 6–23)
CO2: 28 meq/L (ref 19–32)
Calcium: 9.2 mg/dL (ref 8.4–10.5)
Chloride: 104 meq/L (ref 96–112)
Creatinine, Ser: 0.95 mg/dL (ref 0.40–1.20)
GFR: 62.81 mL/min (ref >60.00)
Glucose, Bld: 89 mg/dL (ref 70–99)
Potassium: 3.8 meq/L (ref 3.5–5.1)
Sodium: 139 meq/L (ref 135–145)
Total Bilirubin: 0.5 mg/dL (ref 0.2–1.2)
Total Protein: 7.6 g/dL (ref 6.0–8.3)

## 2023-12-19 LAB — LIPID PANEL
Cholesterol: 175 mg/dL (ref 0–200)
HDL: 66.6 mg/dL (ref >39.00)
LDL Cholesterol: 93 mg/dL (ref 0–99)
NonHDL: 108.52
Total CHOL/HDL Ratio: 3
Triglycerides: 76 mg/dL (ref 0.0–149.0)
VLDL: 15.2 mg/dL (ref 0.0–40.0)

## 2023-12-19 LAB — VITAMIN D 25 HYDROXY (VIT D DEFICIENCY, FRACTURES): VITD: 70.11 ng/mL (ref 30.00–100.00)

## 2023-12-19 LAB — MAGNESIUM: Magnesium: 2 mg/dL (ref 1.5–2.5)

## 2023-12-19 LAB — TSH: TSH: 1.7 u[IU]/mL (ref 0.35–5.50)

## 2023-12-19 LAB — HEMOGLOBIN A1C: Hgb A1c MFr Bld: 6.1 % (ref 4.6–6.5)

## 2023-12-24 NOTE — Assessment & Plan Note (Signed)
Patient encouraged to maintain heart healthy diet, regular exercise, adequate sleep. Consider daily probiotics. Take medications as prescribed. Labs ordered and reviewed COLONOSCOPY: 05/28/20 repeat in 10 years MAMMO:09/2023 repeat in roughly 1 year PAP: 06/01/20 repeat by 2026  DEXA: 06/10/2021 repeat by 2027 Given and reviewed copy of ACP documents from [facility] and encouraged to complete and return

## 2023-12-24 NOTE — Assessment & Plan Note (Signed)
Supplement and monitor 

## 2023-12-24 NOTE — Assessment & Plan Note (Signed)
Hydrate and monitor 

## 2023-12-24 NOTE — Assessment & Plan Note (Signed)
Well controlled, no changes to meds. Encouraged heart healthy diet such as the DASH diet and exercise as tolerated.  °

## 2023-12-24 NOTE — Assessment & Plan Note (Signed)
encouraged heart healthy diet, avoid trans fats, minimize simple carbs and saturated fats. Increase exercise as tolerated

## 2023-12-24 NOTE — Assessment & Plan Note (Signed)
hgba1c acceptable, minimize simple carbs. Increase exercise as tolerated.  

## 2023-12-24 NOTE — Assessment & Plan Note (Signed)
Encouraged to get adequate exercise, calcium and vitamin d intake 

## 2023-12-25 ENCOUNTER — Ambulatory Visit (INDEPENDENT_AMBULATORY_CARE_PROVIDER_SITE_OTHER): Payer: Medicare PPO | Admitting: Family Medicine

## 2023-12-25 VITALS — BP 120/70 | HR 82 | Temp 98.0°F | Resp 18 | Ht 60.0 in | Wt 173.8 lb

## 2023-12-25 DIAGNOSIS — E785 Hyperlipidemia, unspecified: Secondary | ICD-10-CM

## 2023-12-25 DIAGNOSIS — R252 Cramp and spasm: Secondary | ICD-10-CM | POA: Diagnosis not present

## 2023-12-25 DIAGNOSIS — M8580 Other specified disorders of bone density and structure, unspecified site: Secondary | ICD-10-CM

## 2023-12-25 DIAGNOSIS — R739 Hyperglycemia, unspecified: Secondary | ICD-10-CM

## 2023-12-25 DIAGNOSIS — I10 Essential (primary) hypertension: Secondary | ICD-10-CM

## 2023-12-25 DIAGNOSIS — Z0000 Encounter for general adult medical examination without abnormal findings: Secondary | ICD-10-CM

## 2023-12-25 DIAGNOSIS — E559 Vitamin D deficiency, unspecified: Secondary | ICD-10-CM

## 2023-12-25 NOTE — Patient Instructions (Addendum)
Chair Yoga Miralax with benefiber daily to twice daily Preventive Care 65 Years and Older, Female Preventive care refers to lifestyle choices and visits with your health care provider that can promote health and wellness. Preventive care visits are also called wellness exams. What can I expect for my preventive care visit? Counseling Your health care provider may ask you questions about your: Medical history, including: Past medical problems. Family medical history. Pregnancy and menstrual history. History of falls. Current health, including: Memory and ability to understand (cognition). Emotional well-being. Home life and relationship well-being. Sexual activity and sexual health. Lifestyle, including: Alcohol, nicotine or tobacco, and drug use. Access to firearms. Diet, exercise, and sleep habits. Work and work Astronomer. Sunscreen use. Safety issues such as seatbelt and bike helmet use. Physical exam Your health care provider will check your: Height and weight. These may be used to calculate your BMI (body mass index). BMI is a measurement that tells if you are at a healthy weight. Waist circumference. This measures the distance around your waistline. This measurement also tells if you are at a healthy weight and may help predict your risk of certain diseases, such as type 2 diabetes and high blood pressure. Heart rate and blood pressure. Body temperature. Skin for abnormal spots. What immunizations do I need?  Vaccines are usually given at various ages, according to a schedule. Your health care provider will recommend vaccines for you based on your age, medical history, and lifestyle or other factors, such as travel or where you work. What tests do I need? Screening Your health care provider may recommend screening tests for certain conditions. This may include: Lipid and cholesterol levels. Hepatitis C test. Hepatitis B test. HIV (human immunodeficiency virus)  test. STI (sexually transmitted infection) testing, if you are at risk. Lung cancer screening. Colorectal cancer screening. Diabetes screening. This is done by checking your blood sugar (glucose) after you have not eaten for a while (fasting). Mammogram. Talk with your health care provider about how often you should have regular mammograms. BRCA-related cancer screening. This may be done if you have a family history of breast, ovarian, tubal, or peritoneal cancers. Bone density scan. This is done to screen for osteoporosis. Talk with your health care provider about your test results, treatment options, and if necessary, the need for more tests. Follow these instructions at home: Eating and drinking  Eat a diet that includes fresh fruits and vegetables, whole grains, lean protein, and low-fat dairy products. Limit your intake of foods with high amounts of sugar, saturated fats, and salt. Take vitamin and mineral supplements as recommended by your health care provider. Do not drink alcohol if your health care provider tells you not to drink. If you drink alcohol: Limit how much you have to 0-1 drink a day. Know how much alcohol is in your drink. In the [place] one drink equals one 12 oz bottle of beer (355 mL), one 5 oz glass of wine (148 mL), or one 1 oz glass of hard liquor (44 mL). Lifestyle Brush your teeth every morning and night with fluoride toothpaste. Floss one time each day. Exercise for at least 30 minutes 5 or more days each week. Do not use any products that contain nicotine or tobacco. These products include cigarettes, chewing tobacco, and vaping devices, such as e-cigarettes. If you need help quitting, ask your health care provider. Do not use drugs. If you are sexually active, practice safe sex. Use a condom or other form of protection in order  to prevent STIs. Take aspirin only as told by your health care provider. Make sure that you understand how much to take and what form to  take. Work with your health care provider to find out whether it is safe and beneficial for you to take aspirin daily. Ask your health care provider if you need to take a cholesterol-lowering medicine (statin). Find healthy ways to manage stress, such as: Meditation, yoga, or listening to music. Journaling. Talking to a trusted person. Spending time with friends and family. Minimize exposure to UV radiation to reduce your risk of skin cancer. Safety Always wear your seat belt while driving or riding in a vehicle. Do not drive: If you have been drinking alcohol. Do not ride with someone who has been drinking. When you are tired or distracted. While texting. If you have been using any mind-altering substances or drugs. Wear a helmet and other protective equipment during sports activities. If you have firearms in your house, make sure you follow all gun safety procedures. What's next? Visit your health care provider once a year for an annual wellness visit. Ask your health care provider how often you should have your eyes and teeth checked. Stay up to date on all vaccines. This information is not intended to replace advice given to you by your health care provider. Make sure you discuss any questions you have with your health care provider. Document Revised: 05/19/2021 Document Reviewed: 05/19/2021 Elsevier Patient Education  2024 [employer].

## 2023-12-26 ENCOUNTER — Encounter: Payer: Self-pay | Admitting: Family Medicine

## 2023-12-26 ENCOUNTER — Encounter: Payer: Medicare PPO | Admitting: Family Medicine

## 2023-12-26 NOTE — Progress Notes (Signed)
Subjective:    Patient ID: Jenna Johnson, female    DOB: 10-15-1956, 68 y.o.   MRN: [id number]  Chief Complaint  Patient presents with  . Annual Exam    HPI Discussed the use of AI scribe software for clinical note transcription with the patient, who gave verbal consent to proceed.  History of Present Illness   The patient, with a history of endometriosis and abnormal Pap smears, reports having a self Pap smear last year. The patient has not had any abnormal results for several years. The patient also reports nocturia, waking up three times a night to urinate, which is causing sleep disruption.  The patient also reports constipation, with bowel movements occurring three to four times a day but in small amounts. The patient describes the sensation of needing to go to the restroom urgently, especially when experiencing gas or a sneeze. The patient has been managing this with Benefiber and prunes.  The patient has been experiencing plantar fasciitis, which has been causing pain and limiting mobility. The patient has tried inserts and stretching exercises, but the pain persists. The patient also mentions a tremor in the left hand, which has been present since 2005 following a mini stroke. The tremor does not interfere with daily activities but is noticeable when using a pen.  The patient also mentions occasional nerve pain, described as a sudden surge or feeling like little bee stings, which can occur in various parts of the body including the hands and feet. The patient denies any family history of essential tremors.  The patient has also been struggling with weight gain, which has been exacerbated by the plantar fasciitis limiting her ability to exercise. The patient has a history of prediabetes and is currently taking metoprolol.        Past Medical History:  Diagnosis Date  . Anemia    long time ago per pt   . Arthritis of knee, right    and left knee   . Back pain   .  Bursitis of both hips   . Cataract    forming  right eye she thinks   . Dermatitis 09/18/2013  . Endometriosis   . GERD (gastroesophageal reflux disease)    changed diet and better   . H/O atrial septal defect   . Hypertension   . IBS (irritable bowel syndrome)   . Lactose intolerance   . Myalgia and myositis 09/18/2013  . Osteopenia    mild   . TIA (transient ischemic attack) 2005    Past Surgical History:  Procedure Laterality Date  . ABDOMINAL HYSTERECTOMY  68 yrs old   still has one ovary  . APPENDECTOMY  68 yrs old  . ASD REPAIR  2006  . COLONOSCOPY     10 yr in [place]   . hole in heart    . RIGHT OOPHORECTOMY     ruptured ovarian cyst at age 85  . tia  surgery 2005    Family History  Problem Relation Age of Onset  . ADD / ADHD Mother   . Multiple sclerosis Mother   . Other Mother        blue tumor, air embolism,  . Hypertension Mother   . Obesity Mother   . Diabetes Father 72       type 2  . Other Father        fatty liver, lactose intolerant  . Thyroid disease Father   . Colon polyps Father   . Other  Sister        s/p hysterectomy  . Other Sister        s/p hysterectomy  . Cancer Paternal Grandmother 41       breast  . Diabetes Paternal Grandmother   . Breast cancer Paternal Grandmother   . Colon cancer Neg Hx   . Esophageal cancer Neg Hx   . Stomach cancer Neg Hx   . Rectal cancer Neg Hx     Social History   Socioeconomic History  . Marital status: Married    Spouse name: Leomia Kinslow  . Number of children: 0  . Years of education: Not on file  . Highest education level: Not on file  Occupational History  . Occupation: Retired  Tobacco Use  . Smoking status: Never  . Smokeless tobacco: Never  Vaping Use  . Vaping status: Never Used  Substance and Sexual Activity  . Alcohol use: Yes    Comment: occasionally  . Drug use: No  . Sexual activity: Yes    Partners: Male  Other Topics Concern  . Not on file  Social History Narrative  .  Not on file   Social Drivers of Health   Financial Resource Strain: Not on file  Food Insecurity: Not on file  Transportation Needs: Not on file  Physical Activity: Not on file  Stress: Not on file  Social Connections: Not on file  Intimate Partner Violence: Not on file    Outpatient Medications Prior to Visit  Medication Sig Dispense Refill  . acetaminophen (TYLENOL) 650 MG CR tablet Take 650 mg by mouth every 8 (eight) hours as needed for pain.    Marland Kitchen amLODipine (NORVASC) 2.5 MG tablet TAKE 1 TABLET(2.5 MG) BY MOUTH DAILY 90 tablet 1  . aspirin EC 81 MG tablet Take 1 tablet (81 mg total) by mouth daily.    . calcium carbonate (OSCAL) 1500 (600 Ca) MG TABS tablet Take 600 mg of elemental calcium by mouth 2 (two) times daily with a meal. Also includes of VitaminD    . cycloSPORINE (RESTASIS) 0.05 % ophthalmic emulsion Place 1 drop into both eyes 2 (two) times daily. 0.4 mL   . omeprazole (PRILOSEC) 20 MG capsule TAKE 1 CAPSULE(20 MG) BY MOUTH DAILY 30 MINUTES BEFORE BREAKFAST 90 capsule 1   No facility-administered medications prior to visit.    Allergies  Allergen Reactions  . Codeine   . Lactose Intolerance (Gi)     Review of Systems  Constitutional:  Negative for chills, fever and malaise/fatigue.  HENT:  Negative for congestion and hearing loss.   Eyes:  Negative for discharge.  Respiratory:  Negative for cough, sputum production and shortness of breath.   Cardiovascular:  Negative for chest pain, palpitations and leg swelling.  Gastrointestinal:  Positive for constipation. Negative for abdominal pain, blood in stool, diarrhea, heartburn, nausea and vomiting.  Genitourinary:  Negative for dysuria, frequency, hematuria and urgency.  Musculoskeletal:  Positive for joint pain and myalgias. Negative for back pain and falls.  Skin:  Negative for rash.  Neurological:  Negative for dizziness, sensory change, loss of consciousness, weakness and headaches.   Endo/Heme/Allergies:  Negative for environmental allergies. Does not bruise/bleed easily.  Psychiatric/Behavioral:  Negative for depression and suicidal ideas. The patient is not nervous/anxious and does not have insomnia.       Objective:    Physical Exam Constitutional:      General: She is not in acute distress.    Appearance: Normal appearance. She  is not diaphoretic.  HENT:     Head: Normocephalic and atraumatic.     Right Ear: Tympanic membrane, ear canal and external ear normal.     Left Ear: Tympanic membrane, ear canal and external ear normal.     Nose: Nose normal.     Mouth/Throat:     Mouth: Mucous membranes are moist.     Pharynx: Oropharynx is clear. No oropharyngeal exudate.  Eyes:     General: No scleral icterus.       Right eye: No discharge.        Left eye: No discharge.     Conjunctiva/sclera: Conjunctivae normal.     Pupils: Pupils are equal, round, and reactive to light.  Neck:     Thyroid: No thyromegaly.  Cardiovascular:     Rate and Rhythm: Normal rate and regular rhythm.     Heart sounds: Normal heart sounds. No murmur heard. Pulmonary:     Effort: Pulmonary effort is normal. No respiratory distress.     Breath sounds: Normal breath sounds. No wheezing or rales.  Abdominal:     General: Bowel sounds are normal. There is no distension.     Palpations: Abdomen is soft. There is no mass.     Tenderness: There is no abdominal tenderness.  Musculoskeletal:        General: No tenderness. Normal range of motion.     Cervical back: Normal range of motion and neck supple.  Lymphadenopathy:     Cervical: No cervical adenopathy.  Skin:    General: Skin is warm and dry.     Findings: No rash.  Neurological:     General: No focal deficit present.     Mental Status: She is alert and oriented to person, place, and time.     Cranial Nerves: No cranial nerve deficit.     Coordination: Coordination normal.     Deep Tendon Reflexes: Reflexes are normal and  symmetric. Reflexes normal.  Psychiatric:        Mood and Affect: Mood normal.        Behavior: Behavior normal.        Thought Content: Thought content normal.        Judgment: Judgment normal.   BP 120/70 (BP Location: Right Arm, Patient Position: Sitting)   Pulse 82   Temp 98 F (36.7 C) (Oral)   Resp 18   Ht 5' (1.524 m)   Wt 173 lb 12.8 oz (78.8 kg)   SpO2 99%   BMI 33.94 kg/m  Wt Readings from Last 3 Encounters:  12/25/23 173 lb 12.8 oz (78.8 kg)  09/05/23 173 lb (78.5 kg)  08/03/23 174 lb (78.9 kg)    Diabetic Foot Exam - Simple   No data filed    Lab Results  Component Value Date   WBC 8.4 12/19/2023   HGB 14.0 12/19/2023   HCT 43.5 12/19/2023   PLT 179.0 12/19/2023   GLUCOSE 89 12/19/2023   CHOL 175 12/19/2023   TRIG 76.0 12/19/2023   HDL 66.60 12/19/2023   LDLCALC 93 12/19/2023   ALT 16 12/19/2023   AST 18 12/19/2023   NA 139 12/19/2023   K 3.8 12/19/2023   CL 104 12/19/2023   CREATININE 0.95 12/19/2023   BUN 15 12/19/2023   CO2 28 12/19/2023   TSH 1.70 12/19/2023   HGBA1C 6.1 12/19/2023    Lab Results  Component Value Date   TSH 1.70 12/19/2023   Lab Results  Component Value  Date   WBC 8.4 12/19/2023   HGB 14.0 12/19/2023   HCT 43.5 12/19/2023   MCV 86.7 12/19/2023   PLT 179.0 12/19/2023   Lab Results  Component Value Date   NA 139 12/19/2023   K 3.8 12/19/2023   CO2 28 12/19/2023   GLUCOSE 89 12/19/2023   BUN 15 12/19/2023   CREATININE 0.95 12/19/2023   BILITOT 0.5 12/19/2023   ALKPHOS 71 12/19/2023   AST 18 12/19/2023   ALT 16 12/19/2023   PROT 7.6 12/19/2023   ALBUMIN 4.6 12/19/2023   CALCIUM 9.2 12/19/2023   GFR 62.81 12/19/2023   Lab Results  Component Value Date   CHOL 175 12/19/2023   Lab Results  Component Value Date   HDL 66.60 12/19/2023   Lab Results  Component Value Date   LDLCALC 93 12/19/2023   Lab Results  Component Value Date   TRIG 76.0 12/19/2023   Lab Results  Component Value Date   CHOLHDL  3 12/19/2023   Lab Results  Component Value Date   HGBA1C 6.1 12/19/2023       Assessment & Plan:  Essential hypertension Assessment & Plan: Well controlled, no changes to meds. Encouraged heart healthy diet such as the DASH diet and exercise as tolerated.     Hyperglycemia Assessment & Plan: hgba1c acceptable, minimize simple carbs. Increase exercise as tolerated.   Hyperlipidemia, unspecified hyperlipidemia type Assessment & Plan: encouraged heart healthy diet, avoid trans fats, minimize simple carbs and saturated fats. Increase exercise as tolerated   Muscle cramp Assessment & Plan: Hydrate and monitor    Osteopenia, unspecified location Assessment & Plan: Encouraged to get adequate exercise, calcium and vitamin d intake    Vitamin D deficiency Assessment & Plan: Supplement and monitor      Preventative health care Assessment & Plan: Patient encouraged to maintain heart healthy diet, regular exercise, adequate sleep. Consider daily probiotics. Take medications as prescribed. Labs ordered and reviewed COLONOSCOPY: 05/28/20 repeat in 10 years MAMMO:09/2023 repeat in roughly 1 year PAP: 06/01/20 repeat by 2026  DEXA: 06/10/2021 repeat by 2027 Given and reviewed copy of ACP documents from [facility] Secretary of State and encouraged to complete and return      Assessment and Plan    Cervical Cancer Screening   Last Pap smear in 2021 was normal. Discussed the current guidelines for Pap smear frequency in the context of previous normal results and negative HPV test.   -Plan to perform next Pap smear during physical exam next year.    Gastroesophageal Reflux Disease (GERD)   Reports symptoms of food not digesting well in the evening and feeling like she might come back up. Currently taking Omeprazole as needed.   -Advised to eat larger meals earlier in the day, avoid fatty and spicy foods in the evening, and consider sleeping on left side to reduce symptoms.   -Consider  adding a dose of Miralax with Benefiber daily to twice daily to improve bowel movements.    Plantar Fasciitis   Reports persistent plantar fasciitis with heel spurs. Currently using sole inserts and seeing a sports medicine specialist.   -Advised to continue stretching exercises and consider seeing a podiatrist if symptoms do not improve or worsen.    Transient Neurologic Symptoms   Reports transient tingling in hands and feet. Symptoms are migratory and do not persist.   -No immediate intervention needed. Advised to monitor symptoms and report if she becomes localized to one hand or persist.    General Health  Maintenance   -Received flu shot in August at [facility].   -Consider scheduling an annual wellness exam with the nursing team.   -Encouraged to maintain physical activity, hydration, good diet, and stress management.   -Schedule a six-month follow-up visit.         Danise Edge, MD

## 2023-12-27 ENCOUNTER — Encounter (INDEPENDENT_AMBULATORY_CARE_PROVIDER_SITE_OTHER): Payer: Self-pay | Admitting: Adult Health

## 2023-12-27 ENCOUNTER — Ambulatory Visit (INDEPENDENT_AMBULATORY_CARE_PROVIDER_SITE_OTHER): Payer: Medicare PPO | Admitting: Adult Health

## 2023-12-27 VITALS — BP 112/76 | HR 69 | Temp 97.8°F | Ht 60.0 in | Wt 167.0 lb

## 2023-12-27 DIAGNOSIS — M8580 Other specified disorders of bone density and structure, unspecified site: Secondary | ICD-10-CM | POA: Diagnosis not present

## 2023-12-27 DIAGNOSIS — E669 Obesity, unspecified: Secondary | ICD-10-CM

## 2023-12-27 DIAGNOSIS — I10 Essential (primary) hypertension: Secondary | ICD-10-CM

## 2023-12-27 DIAGNOSIS — E559 Vitamin D deficiency, unspecified: Secondary | ICD-10-CM

## 2023-12-27 DIAGNOSIS — E785 Hyperlipidemia, unspecified: Secondary | ICD-10-CM | POA: Diagnosis not present

## 2023-12-27 DIAGNOSIS — Z6832 Body mass index (BMI) 32.0-32.9, adult: Secondary | ICD-10-CM

## 2023-12-27 DIAGNOSIS — E66811 Obesity, class 1: Secondary | ICD-10-CM

## 2023-12-27 DIAGNOSIS — Z0000 Encounter for general adult medical examination without abnormal findings: Secondary | ICD-10-CM

## 2023-12-27 NOTE — Progress Notes (Signed)
WEIGHT SUMMARY AND BIOMETRICS  Vitals Temp: 97.8 F (36.6 C) BP: 112/76 Pulse Rate: 69 SpO2: 98 %   Anthropometric Measurements Height: 5' (1.524 m) Weight: 167 lb (75.8 kg) BMI (Calculated): 32.62 Weight at Last Visit: 167lb Weight Lost Since Last Visit: 0 Weight Gained Since Last Visit: 0 Starting Weight: 171lb Total Weight Loss (lbs): 6 lb (2.722 kg)   Body Composition  Body Fat %: 35.8 % Fat Mass (lbs): 60 lbs Muscle Mass (lbs): 102.2 lbs Total Body Water (lbs): 63.4 lbs Visceral Fat Rating : 10   Other Clinical Data Fasting: no Labs: no Today's Visit #: 63 Starting Date: 08/27/18    Chief Complaint:   OBESITY Jenna Johnson is here to discuss her progress with her obesity treatment plan. She is on the practicing portion control and making smarter food choices, such as increasing vegetables and decreasing simple carbohydrates and states she is following her eating plan approximately 0 % of the time. She states she is exercising: Tai CHi 45 mins twice weekly   Interim History:   She is being followed by [facility] every 5-6 months per pt's request. Her husband accompanies her to every OV.   08/27/18 09:00   RMR 1556  Waist Measurement  38 inches      09/24/19 07:00  RMR 1382      07/25/23  RMR 1138   2 week Vacation in Feb 2025!!!  08/27/2018 Starting Weight 167 lbs 07/25/2023 Last Weight 167 lbs Today's Weight 167 lbs  Exercise-stopped swimming due to concerns about contact dermatitis from pool water.  She has started bi-weekly Tai Chi  Hydration-40 oz of Decaf Coffee, 8-10 oz plain water  Subjective:   1. Healthcare maintenance Followed by [facility] Q5-36M per pt's preference Starting weight 167 lbs Today's weight 167 lbs  12/25/2023 annual wellness visit with PCP/Dr. Abner Greenspan Labs completed 12/19/2023- reviewed recent results  2. Essential hypertension Discussed Labs 12/19/2023 CMP: Electrolytes and kidney/liver enzymes BP at goal She is on  daily Norvasc 2.5mg   3. Osteopenia, unspecified location Discussed Labs  Latest Reference Range & Units 12/19/23 09:45  VITD 30.00 - 100.00 ng/mL 70.11   She is on daily Oscal and twice weekly OTC Vit D3- unsure of dosage  4. Hyperlipidemia, unspecified hyperlipidemia type Discussed Labs Lipid Panel     Component Value Date/Time   CHOL 175 12/19/2023 0945   CHOL 146 08/27/2018 1300   TRIG 76.0 12/19/2023 0945   HDL 66.60 12/19/2023 0945   HDL 61 08/27/2018 1300   CHOLHDL 3 12/19/2023 0945   VLDL 15.2 12/19/2023 0945   LDLCALC 93 12/19/2023 0945   LDLCALC 73 08/27/2018 1300   LABVLDL 12 08/27/2018 1300    The 10-year ASCVD risk score (Arnett DK, et al., 2019) is: 5.7%   Values used to calculate the score:     Age: 68 years     Sex: Female     Is Non-Hispanic African American: Yes     Diabetic: No     Tobacco smoker: No     Systolic Blood Pressure: 112 mmHg     Is BP treated: Yes     HDL Cholesterol: 66.6 mg/dL     Total Cholesterol: 175 mg/dL   She is on aspirin EC 81 MG tablet  amLODipine (NORVASC) 2.5 MG tablet   Not on statin therapy  5. Vitamin D deficiency Discussed Labs  Latest Reference Range & Units 12/19/23 09:45  VITD 30.00 - 100.00 ng/mL 70.11   Level at  goal She is on daily Oscal and twice weekly OTC Vit D3- unsure of dosage  Assessment/Plan:   1. Healthcare maintenance (Primary) Increase regular exercise Increase protein intake  2. Essential hypertension Continue healthy eating and daily low dose CCB Increase regular exercise  3. Osteopenia, unspecified location Increase regular weight bearing exercise  4. Hyperlipidemia, unspecified hyperlipidemia type Continue healthy eating  Increase regular exercise  5. Vitamin D deficiency Verify OTC Vit D3 dosage at next OV or send MyChart message  6. Obesity, current BMI 32.63  Joshelyn is currently in the action stage of change. As such, her goal is to continue with weight loss efforts.  She has agreed to keeping a food journal and adhering to recommended goals of 1140 calories and 85 protein. TO MAINTAIN TO LOSS WEIGHT: 950 calories and 75g protein/day  Exercise goals: Older adults should follow the adult guidelines. When older adults cannot meet the adult guidelines, they should be as physically active as their abilities and conditions will allow.  Older adults should do exercises that maintain or improve balance if they are at risk of falling.  Older adults should determine their level of effort for physical activity relative to their level of fitness.  Older adults with chronic conditions should understand whether and how their conditions affect their ability to do regular physical activity safely.  Behavioral modification strategies: increasing lean protein intake, decreasing simple carbohydrates, increasing vegetables, increasing water intake, no skipping meals, meal planning and cooking strategies, keeping healthy foods in the home, avoiding temptations, and planning for success.  Kaidan has agreed to follow-up with our clinic in 5-6 months. She was informed of the importance of frequent follow-up visits to maximize her success with intensive lifestyle modifications for her multiple health conditions.   Objective:   Blood pressure 112/76, pulse 69, temperature 97.8 F (36.6 C), height 5' (1.524 m), weight 167 lb (75.8 kg), SpO2 98%. Body mass index is 32.61 kg/m.  General: Cooperative, alert, well developed, in no acute distress. HEENT: Conjunctivae and lids unremarkable. Cardiovascular: Regular rhythm.  Lungs: Normal work of breathing. Neurologic: No focal deficits.   Lab Results  Component Value Date   CREATININE 0.95 12/19/2023   BUN 15 12/19/2023   NA 139 12/19/2023   K 3.8 12/19/2023   CL 104 12/19/2023   CO2 28 12/19/2023   Lab Results  Component Value Date   ALT 16 12/19/2023   AST 18 12/19/2023   ALKPHOS 71 12/19/2023   BILITOT 0.5 12/19/2023    Lab Results  Component Value Date   HGBA1C 6.1 12/19/2023   HGBA1C 6.2 09/05/2023   HGBA1C 6.0 05/24/2023   HGBA1C 6.0 06/16/2022   HGBA1C 5.9 12/30/2021   Lab Results  Component Value Date   INSULIN 8.4 08/27/2018   Lab Results  Component Value Date   TSH 1.70 12/19/2023   Lab Results  Component Value Date   CHOL 175 12/19/2023   HDL 66.60 12/19/2023   LDLCALC 93 12/19/2023   TRIG 76.0 12/19/2023   CHOLHDL 3 12/19/2023   Lab Results  Component Value Date   VD25OH 70.11 12/19/2023   VD25OH 65.59 05/24/2023   VD25OH 66.49 06/16/2022   Lab Results  Component Value Date   WBC 8.4 12/19/2023   HGB 14.0 12/19/2023   HCT 43.5 12/19/2023   MCV 86.7 12/19/2023   PLT 179.0 12/19/2023   Lab Results  Component Value Date   IRON 86 06/03/2021   TIBC 275 06/03/2021   FERRITIN 171 06/03/2021  Attestation Statements:   Reviewed by clinician on day of visit: allergies, medications, problem list, medical history, surgical history, family history, social history, and previous encounter notes.  Time spent on visit including pre-visit chart review and post-visit care and charting was 50 minutes.   I have reviewed the above documentation for accuracy and completeness, and I agree with the above. -  Oluwatobi Visser d. Mamie Hundertmark, NP-C

## 2024-01-01 ENCOUNTER — Encounter (INDEPENDENT_AMBULATORY_CARE_PROVIDER_SITE_OTHER): Payer: Self-pay | Admitting: Adult Health

## 2024-04-09 ENCOUNTER — Ambulatory Visit (INDEPENDENT_AMBULATORY_CARE_PROVIDER_SITE_OTHER)

## 2024-04-09 VITALS — Ht 60.0 in | Wt 167.0 lb

## 2024-04-09 DIAGNOSIS — Z0000 Encounter for general adult medical examination without abnormal findings: Secondary | ICD-10-CM | POA: Diagnosis not present

## 2024-04-09 NOTE — Patient Instructions (Signed)
 Jenna Johnson , Thank you for taking time to come for your Medicare Wellness Visit. I appreciate your ongoing commitment to your health goals. Please review the following plan we discussed and let me know if I can assist you in the future.   Referrals/Orders/Follow-Ups/Clinician Recommendations: Aim for 30 minutes of exercise or brisk walking, 6-8 glasses of water, and 5 servings of fruits and vegetables each day.  This is a list of the screening recommended for you and due dates:  Health Maintenance  Topic Date Due   Pap with HPV screening  06/02/2023   COVID-19 Vaccine (6 - 2024-25 season) 08/31/2024*   Flu Shot  07/05/2024   Medicare Annual Wellness Visit  04/09/2025   Mammogram  09/04/2025   Colon Cancer Screening  05/28/2030   DTaP/Tdap/Td vaccine (3 - Td or Tdap) 06/04/2031   Pneumonia Vaccine  Completed   DEXA scan (bone density measurement)  Completed   Hepatitis C Screening  Completed   Zoster (Shingles) Vaccine  Completed   HPV Vaccine  Aged Out   Meningitis B Vaccine  Aged Out   HIV Screening  Discontinued  *Topic was postponed. The date shown is not the original due date.    Advanced directives: (ACP Link)Information on Advanced Care Planning can be found at [place]  Secretary of [facility] Advance Health Care Directives Advance Health Care Directives. [email or web address]   Next Medicare Annual Wellness Visit scheduled for next year: Yes  Have you seen your provider in the last 6 months (3 months if uncontrolled diabetes)? Yes

## 2024-04-09 NOTE — Progress Notes (Signed)
 Subjective:   Jenna Johnson is a 68 y.o. who presents for a Medicare Wellness preventive visit.  Visit Complete: Virtual I connected with  Olevia Bers on 04/09/24 by a audio enabled telemedicine application and verified that I am speaking with the correct person using two identifiers.  Patient Location: Home  Provider Location: Home Office  I discussed the limitations of evaluation and management by telemedicine. The patient expressed understanding and agreed to proceed.  Vital Signs: Because this visit was a virtual/telehealth visit, some criteria may be missing or patient reported. Any vitals not documented were not able to be obtained and vitals that have been documented are patient reported.  VideoDeclined- This patient declined Librarian, academic. Therefore the visit was completed with audio only.  Persons Participating in Visit: Patient.  AWV Questionnaire: No: Patient Medicare AWV questionnaire was not completed prior to this visit.  Cardiac Risk Factors include: advanced age (>36men, >15 women);dyslipidemia;hypertension     Objective:    Today's Vitals   04/09/24 1655  Weight: 167 lb (75.8 kg)  Height: 5' (1.524 m)   Body mass index is 32.61 kg/m.     04/09/2024    4:58 PM 11/16/2020    1:53 PM  Advanced Directives  Does Patient Have a Medical Advance Directive? No Yes  Does patient want to make changes to medical advance directive?  No - Patient declined  Would patient like information on creating a medical advance directive? Yes (MAU/Ambulatory/Procedural Areas - Information given)     Current Medications (verified) Outpatient Encounter Medications as of 04/09/2024  Medication Sig   acetaminophen (TYLENOL) 650 MG CR tablet Take 650 mg by mouth every 8 (eight) hours as needed for pain.   amLODipine  (NORVASC ) 2.5 MG tablet TAKE 1 TABLET(2.5 MG) BY MOUTH DAILY   aspirin  EC 81 MG tablet Take 1 tablet (81 mg total) by  mouth daily.   calcium carbonate (OSCAL) 1500 (600 Ca) MG TABS tablet Take 600 mg of elemental calcium by mouth 2 (two) times daily with a meal. Also includes 20mcg of VitaminD   Cholecalciferol (VITAMIN D -3) 125 MCG (5000 UT) TABS Take 2 tablets by mouth 2 (two) times a week.   cycloSPORINE (RESTASIS) 0.05 % ophthalmic emulsion Place 1 drop into both eyes 2 (two) times daily.   omeprazole  (PRILOSEC ) 20 MG capsule TAKE 1 CAPSULE(20 MG) BY MOUTH DAILY 30 MINUTES BEFORE BREAKFAST (Patient taking differently: PRN)   No facility-administered encounter medications on file as of 04/09/2024.    Allergies (verified) Codeine and Lactose intolerance (gi)   History: Past Medical History:  Diagnosis Date   Anemia    long time ago per pt    Arthritis of knee, right    and left knee    Back pain    Bursitis of both hips    Cataract    forming  right eye she thinks    Dermatitis 09/18/2013   Endometriosis    GERD (gastroesophageal reflux disease)    changed diet and better    H/O atrial septal defect    Hypertension    IBS (irritable bowel syndrome)    Lactose intolerance    Myalgia and myositis 09/18/2013   Osteopenia    mild    TIA (transient ischemic attack) 2005   Past Surgical History:  Procedure Laterality Date   ABDOMINAL HYSTERECTOMY  68 yrs old   still has one ovary   APPENDECTOMY  68 yrs old   ASD REPAIR  2006   COLONOSCOPY     12 yr in [place]     hole in heart     RIGHT OOPHORECTOMY     ruptured ovarian cyst at age 61   tia  surgery 2005   Family History  Problem Relation Age of Onset   ADD / ADHD Mother    Multiple sclerosis Mother    Other Mother        blue tumor, air embolism,   Hypertension Mother    Obesity Mother    Diabetes Father 56       type 2   Other Father        fatty liver, lactose intolerant   Thyroid  disease Father    Colon polyps Father    Other Sister        s/p hysterectomy   Other Sister        s/p hysterectomy   Cancer Paternal  Grandmother 19       breast   Diabetes Paternal Grandmother    Breast cancer Paternal Grandmother    Colon cancer Neg Hx    Esophageal cancer Neg Hx    Stomach cancer Neg Hx    Rectal cancer Neg Hx    Social History   Socioeconomic History   Marital status: Married    Spouse name: Jenna Johnson   Number of children: 0   Years of education: Not on file   Highest education level: Not on file  Occupational History   Occupation: Retired  Tobacco Use   Smoking status: Never   Smokeless tobacco: Never  Vaping Use   Vaping status: Never Used  Substance and Sexual Activity   Alcohol use: Yes    Comment: occasionally   Drug use: No   Sexual activity: Yes    Partners: Male  Other Topics Concern   Not on file  Social History Narrative   Not on file   Social Drivers of Health   Financial Resource Strain: Low Risk  (04/09/2024)   Overall Financial Resource Strain (CARDIA)    Difficulty of Paying Living Expenses: Not hard at all  Food Insecurity: No Food Insecurity (04/09/2024)   Hunger Vital Sign    Worried About Running Out of Food in the Last Year: Never true    Ran Out of Food in the Last Year: Never true  Transportation Needs: No Transportation Needs (04/09/2024)   PRAPARE - Administrator, Civil Service (Medical): No    Lack of Transportation (Non-Medical): No  Physical Activity: Insufficiently Active (04/09/2024)   Exercise Vital Sign    Days of Exercise per Week: 3 days    Minutes of Exercise per Session: 30 min  Stress: No Stress Concern Present (04/09/2024)   [employer] of Occupational Health - Occupational Stress Questionnaire    Feeling of Stress : Not at all  Social Connections: Moderately Integrated (04/09/2024)   Social Connection and Isolation Panel [NHANES]    Frequency of Communication with Friends and Family: More than three times a week    Frequency of Social Gatherings with Friends and Family: Three times a week    Attends Religious Services: More  than 4 times per year    Active Member of Clubs or Organizations: No    Attends Banker Meetings: Never    Marital Status: Married    Tobacco Counseling Counseling given: Not Answered    Clinical Intake:  Pre-visit preparation completed: Yes  Pain : No/denies pain  Diabetes: No  Lab Results  Component Value Date   HGBA1C 6.1 12/19/2023   HGBA1C 6.2 09/05/2023   HGBA1C 6.0 05/24/2023     How often do you need to have someone help you when you read instructions, pamphlets, or other written materials from your doctor or pharmacy?: 1 - Never  Interpreter Needed?: No  Information entered by :: Seabron Cypress LPN   Activities of Daily Living     04/09/2024    4:57 PM  In your present state of health, do you have any difficulty performing the following activities:  Hearing? 0  Vision? 0  Difficulty concentrating or making decisions? 0  Walking or climbing stairs? 0  Dressing or bathing? 0  Doing errands, shopping? 0  Preparing Food and eating ? N  Using the Toilet? N  In the past six months, have you accidently leaked urine? N  Do you have problems with loss of bowel control? N  Managing your Medications? N  Managing your Finances? N  Housekeeping or managing your Housekeeping? N    Patient Care Team: Neda Balk, MD as PCP - General (Family Medicine) Artemisa Lars, MD as Consulting Physician (Ophthalmology) Devon Fogo, MD (Inactive) as Consulting Physician (Dermatology)  Indicate any recent Medical Services you may have received from other than [facility] providers in the past year (date may be approximate).     Assessment:   This is a routine wellness examination for Jenna Johnson.  Hearing/Vision screen Hearing Screening - Comments:: Denies hearing difficulties   Vision Screening - Comments:: Wears rx glasses - up to date with routine eye exams with Dr. Kirkland Peppers   Goals Addressed             This Visit's Progress    Remain  active and independent         Depression Screen     04/09/2024    4:56 PM 12/25/2023   10:22 AM 12/20/2022   10:18 AM 06/16/2022    9:11 AM 06/03/2021    9:59 AM 06/01/2020   11:26 AM 08/27/2018    9:22 AM  PHQ 2/9 Scores  PHQ - 2 Score 0 0 0 0 0 0 1  PHQ- 9 Score     0 0 3    Fall Risk     04/09/2024    4:57 PM 12/25/2023   10:22 AM 12/20/2022   10:18 AM 06/03/2021    9:59 AM 06/01/2020   10:13 AM  Fall Risk   Falls in the past year? 0 0 0 0 0  Number falls in past yr: 0 0 0 0   Injury with Fall? 0 0 0 0   Risk for fall due to : No Fall Risks No Fall Risks     Follow up Falls prevention discussed;Education provided;Falls evaluation completed Falls evaluation completed Falls evaluation completed      MEDICARE RISK AT HOME:  Medicare Risk at Home Any stairs in or around the home?: No If so, are there any without handrails?: No Home free of loose throw rugs in walkways, pet beds, electrical cords, etc?: Yes Adequate lighting in your home to reduce risk of falls?: Yes Life alert?: No Use of a cane, walker or w/c?: No Grab bars in the bathroom?: Yes Shower chair or bench in shower?: No Elevated toilet seat or a handicapped toilet?: Yes  TIMED UP AND GO:  Was the test performed?  No  Cognitive Function: 6CIT completed        04/09/2024  4:58 PM  6CIT Screen  What Year? 0 points  What month? 0 points  What time? 0 points  Count back from 20 0 points  Months in reverse 0 points  Repeat phrase 0 points  Total Score 0 points    Immunizations Immunization History  Administered Date(s) Administered   Influenza Inj Mdck Quad Pf 09/15/2019   Influenza, High Dose Seasonal PF 09/15/2023   Influenza,inj,Quad PF,6+ Mos 09/16/2013, 09/05/2018   Influenza-Unspecified 09/04/2014, 10/01/2015, 09/14/2022   Moderna Sars-Covid-2 Vaccination 01/27/2020, 02/25/2020, 10/07/2020, 09/14/2022   PNEUMOCOCCAL CONJUGATE-20 09/14/2022, 06/03/2023   Pfizer Covid-19 Vaccine Bivalent Booster  72yrs & up 06/03/2023   Pneumococcal Conjugate-13 09/16/2013, 11/02/2015   Rsv, Mab, Nirsevimab-alip, 1 Ml, Neonate To 24 Mos(Beyfortus) 10/21/2022   Tdap 12/05/2008, 06/03/2021   Zoster Recombinant(Shingrix) 09/21/2017, 06/03/2023, 06/17/2023   Zoster, Live 10/01/2015    Screening Tests Health Maintenance  Topic Date Due   Cervical Cancer Screening (HPV/Pap Cotest)  06/02/2023   COVID-19 Vaccine (6 - 2024-25 season) 08/31/2024 (Originally 08/06/2023)   INFLUENZA VACCINE  07/05/2024   Medicare Annual Wellness (AWV)  04/09/2025   MAMMOGRAM  09/04/2025   Colonoscopy  05/28/2030   DTaP/Tdap/Td (3 - Td or Tdap) 06/04/2031   Pneumonia Vaccine 66+ Years old  Completed   DEXA SCAN  Completed   Hepatitis C Screening  Completed   Zoster Vaccines- Shingrix  Completed   HPV VACCINES  Aged Out   Meningococcal B Vaccine  Aged Out   HIV Screening  Discontinued    Health Maintenance  Health Maintenance Due  Topic Date Due   Cervical Cancer Screening (HPV/Pap Cotest)  06/02/2023    Additional Screening:  Vision Screening: Recommended annual ophthalmology exams for early detection of glaucoma and other disorders of the eye.  Dental Screening: Recommended annual dental exams for proper oral hygiene  Community Resource Referral / Chronic Care Management: CRR required this visit?  No   CCM required this visit?  No     Plan:     I have personally reviewed and noted the following in the patient's chart:   Medical and social history Use of alcohol, tobacco or illicit drugs  Current medications and supplements including opioid prescriptions. Patient is not currently taking opioid prescriptions. Functional ability and status Nutritional status Physical activity Advanced directives List of other physicians Hospitalizations, surgeries, and ER visits in previous 12 months Vitals Screenings to include cognitive, depression, and falls Referrals and appointments  In addition, I have  reviewed and discussed with patient certain preventive protocols, quality metrics, and best practice recommendations. A written personalized care plan for preventive services as well as general preventive health recommendations were provided to patient.     Seabron Cypress [place], [place]   07/06/9561   After Visit Summary: (MyChart) Due to this being a telephonic visit, the after visit summary with patients personalized plan was offered to patient via MyChart   Notes: Nothing significant to report at this time.

## 2024-06-23 NOTE — Assessment & Plan Note (Signed)
encouraged heart healthy diet, avoid trans fats, minimize simple carbs and saturated fats. Increase exercise as tolerated

## 2024-06-23 NOTE — Assessment & Plan Note (Signed)
 Well controlled, no changes to meds. Encouraged heart healthy diet such as the DASH diet and exercise as tolerated.

## 2024-06-23 NOTE — Assessment & Plan Note (Signed)
 hgba1c acceptable, minimize simple carbs. Increase exercise as tolerated.

## 2024-06-23 NOTE — Assessment & Plan Note (Signed)
Bone density shows osteopenia, which is thinner than normal but not as bad as osteoporosis. Recommend calcium intake of 1200 to 1500 mg daily, divided into roughly 3 doses. Best source is the diet and a single dairy serving is about 500 mg, a supplement of calcium citrate once or twice daily to balance diet is fine if not getting enough in diet. Also need Vitamin D 2000 IU caps, 1 cap daily if not already taking vitamin D. Also recommend weight baring exercise on hips and upper body to keep bones strong  

## 2024-06-24 ENCOUNTER — Ambulatory Visit: Payer: Medicare PPO | Admitting: Family Medicine

## 2024-06-24 ENCOUNTER — Encounter: Payer: Self-pay | Admitting: Family Medicine

## 2024-06-24 VITALS — BP 138/74 | HR 72 | Resp 16 | Ht 60.0 in | Wt 174.2 lb

## 2024-06-24 DIAGNOSIS — R739 Hyperglycemia, unspecified: Secondary | ICD-10-CM

## 2024-06-24 DIAGNOSIS — I10 Essential (primary) hypertension: Secondary | ICD-10-CM | POA: Diagnosis not present

## 2024-06-24 DIAGNOSIS — E785 Hyperlipidemia, unspecified: Secondary | ICD-10-CM | POA: Diagnosis not present

## 2024-06-24 DIAGNOSIS — M8580 Other specified disorders of bone density and structure, unspecified site: Secondary | ICD-10-CM

## 2024-06-24 DIAGNOSIS — E559 Vitamin D deficiency, unspecified: Secondary | ICD-10-CM

## 2024-06-24 MED ORDER — DOXYCYCLINE HYCLATE 100 MG PO TABS: 100.0000 mg | ORAL_TABLET | Freq: Two times a day (BID) | ORAL | 0 refills | Status: DC

## 2024-06-24 NOTE — Patient Instructions (Addendum)
 Recommend calcium intake of 1200 to 1500 mg daily, divided into roughly 3 doses. Best source is the diet and a single dairy serving is about 500 mg, a supplement of calcium citrate once or twice daily to balance diet is fine if not getting enough in diet. Also need Vitamin D  2000 IU caps, 1 cap daily if not already taking vitamin D . Also recommend weight baring exercise on hips and upper body to keep bones strong   Heartburn Heartburn is when you feel pain or discomfort in your throat or chest. It may feel like a burning pain. It can also cause a bad, acid-like taste in your mouth. It may feel worse: When you lie down. When you bend over. At night. Heartburn may be caused by the contents in your stomach moving back up into your esophagus. Your esophagus is the part of your body that moves food from your mouth to your stomach. Follow these instructions at home: Eating and drinking Follow an eating plan as told by your health care provider. Avoid certain foods and drinks as told. These may include: Coffee and tea, with or without caffeine. Alcohol. Energy drinks and sports drinks. Fizzy drinks or sodas. Chocolate and cocoa. Peppermint and mint flavorings. Garlic and onions. Horseradish. Spicy and acidic foods. These include: Peppers. Chili powder and curry powder. Vinegar. Hot sauces and BBQ sauce. Citrus fruits and juices. These include: Oranges. Lemons. Limes. Tomato-based foods. These include: Red sauce and pizza with red sauce. Chili. Salsa. Fried and fatty foods. These include: Donuts. [place] fries. Potato chips. High-fat dressings. High-fat meats. These include: Hot dogs and sausage. Rib eye steak. Ham and bacon. High-fat dairy items. These include: Whole milk. Butter. Cream cheese. Eat small meals often. Avoid eating big meals. Avoid drinking lots of liquid with your meals. Try not to eat meals during the 2-3 hours before bedtime. Try not to lie down right after  you eat. Do not exercise right after you eat. Lifestyle  If you're overweight, lose an amount of weight that's healthy for you. Ask your provider about a safe weight loss goal. Do not smoke, vape, or use nicotine or tobacco. These can make your symptoms worse. If you need help quitting, talk with your provider. Wear loose clothes. Do not wear things that are tight around your waist. When you sleep, try: Raising the head of your bed about 6 inches (15 cm). You can use a wedge to do this. Lying down on your left side. Try to lower your stress. If you need help doing this, ask your provider. Medicines Take your medicines only as told by your provider. Do not take aspirin  or ibuprofen unless you're told to. Stop medicines only as told by your provider. If you stop taking some medicines too quickly, your symptoms may get worse. General instructions Watch for any changes in your symptoms. Contact a health care provider if: You have new symptoms. You have weight loss for no known reason. You have trouble swallowing. It hurts to swallow. You have wheezing. This is when you make high-pitched whistling sounds when you breathe, most often when you breathe out. You have a cough that won't go away. Your symptoms don't get better with treatment. You have heartburn often for more than 2 weeks. Get help right away if: You have pain all of a sudden in your: Arms. Neck. Jaw. Teeth. Back. You feel sweaty, dizzy, or light-headed all of a sudden. You have chest pain or shortness of breath. You vomit and  your vomit looks like blood or coffee grounds. Your poop is bloody or black. These symptoms may be an emergency. Call 911 right away. Do not wait to see if the symptoms will go away. Do not drive yourself to the hospital. This information is not intended to replace advice given to you by your health care provider. Make sure you discuss any questions you have with your health care  provider. Document Revised: 10/03/2023 Document Reviewed: 04/14/2023 Elsevier Patient Education  2024 [employer].

## 2024-06-24 NOTE — Progress Notes (Signed)
 Subjective:    Patient ID: Jenna Johnson, female    DOB: 03-19-1956, 68 y.o.   MRN: [id number]  Chief Complaint  Patient presents with   Medical Management of Chronic Issues    Patient presents today for a 6 month follow-up.    HPI Discussed the use of AI scribe software for clinical note transcription with the patient, who gave verbal consent to proceed.  History of Present Illness Jenna Johnson is a 68 year old female who presents with plantar fasciitis and associated symptoms.  She has been experiencing ongoing issues with plantar fasciitis, which has shown improvement over the last six months. She engages in walking and Tai Chi twice a week, which she finds beneficial, but experiences pain after walking about a mile. Previous visits to sports medicine have not provided significant relief. She has started icing her foot twice a day and finds it somewhat helpful.  She has noticed bilateral ankle swelling and swelling in her right hand, which appeared suddenly after possibly bumping it. She experiences tingling from her hand down to her feet, associated with sciatica affecting her right leg, exacerbated by certain positions and shoes. Exercises provide temporary relief.  She experiences sinus issues, including dizziness upon waking and turning over, and a sensation of fluid in her head. She reports nasal stuffiness and yellowish phlegm. She has been taking guaifenesin to manage these symptoms. No fever, chills, or sore throat.  She reports heartburn, particularly when lying down, and uses omeprazole  as needed. Spicy foods and large meals exacerbate her symptoms. She also experiences constipation, managed with MiraLAX three times a week, which she finds helpful.  She has a history of low vitamin D  levels and has been taking Oscal irregularly due to its size and taste. She consumes lactose-free dairy products and homemade yogurt to supplement her calcium  intake.    Past Medical History:  Diagnosis Date   Anemia    long time ago per pt    Arthritis of knee, right    and left knee    Back pain    Bursitis of both hips    Cataract    forming  right eye she thinks    Dermatitis 09/18/2013   Endometriosis    GERD (gastroesophageal reflux disease)    changed diet and better    H/O atrial septal defect    Hypertension    IBS (irritable bowel syndrome)    Lactose intolerance    Myalgia and myositis 09/18/2013   Osteopenia    mild    TIA (transient ischemic attack) 2005    Past Surgical History:  Procedure Laterality Date   ABDOMINAL HYSTERECTOMY  68 yrs old   still has one ovary   APPENDECTOMY  68 yrs old   ASD REPAIR  2006   COLONOSCOPY     12 yr in [place]     hole in heart     RIGHT OOPHORECTOMY     ruptured ovarian cyst at age 39   tia  surgery 2005    Family History  Problem Relation Age of Onset   ADD / ADHD Mother    Multiple sclerosis Mother    Other Mother        blue tumor, air embolism,   Hypertension Mother    Obesity Mother    Diabetes Father 68       type 2   Other Father        fatty liver, lactose intolerant  Thyroid  disease Father    Colon polyps Father    Other Sister        s/p hysterectomy   Other Sister        s/p hysterectomy   Cancer Paternal Grandmother 80       breast   Diabetes Paternal Grandmother    Breast cancer Paternal Grandmother    Colon cancer Neg Hx    Esophageal cancer Neg Hx    Stomach cancer Neg Hx    Rectal cancer Neg Hx     Social History   Socioeconomic History   Marital status: Married    Spouse name: Nicha Hemann   Number of children: 0   Years of education: Not on file   Highest education level: Not on file  Occupational History   Occupation: Retired  Tobacco Use   Smoking status: Never   Smokeless tobacco: Never  Vaping Use   Vaping status: Never Used  Substance and Sexual Activity   Alcohol use: Yes    Comment: occasionally   Drug use: No    Sexual activity: Yes    Partners: Male  Other Topics Concern   Not on file  Social History Narrative   Not on file   Social Drivers of Health   Financial Resource Strain: Low Risk  (04/09/2024)   Overall Financial Resource Strain (CARDIA)    Difficulty of Paying Living Expenses: Not hard at all  Food Insecurity: No Food Insecurity (04/09/2024)   Hunger Vital Sign    Worried About Running Out of Food in the Last Year: Never true    Ran Out of Food in the Last Year: Never true  Transportation Needs: No Transportation Needs (04/09/2024)   PRAPARE - Administrator, Civil Service (Medical): No    Lack of Transportation (Non-Medical): No  Physical Activity: Insufficiently Active (04/09/2024)   Exercise Vital Sign    Days of Exercise per Week: 3 days    Minutes of Exercise per Session: 30 min  Stress: No Stress Concern Present (04/09/2024)   [employer] of Occupational Health - Occupational Stress Questionnaire    Feeling of Stress : Not at all  Social Connections: Moderately Integrated (04/09/2024)   Social Connection and Isolation Panel    Frequency of Communication with Friends and Family: More than three times a week    Frequency of Social Gatherings with Friends and Family: Three times a week    Attends Religious Services: More than 4 times per year    Active Member of Clubs or Organizations: No    Attends Banker Meetings: Never    Marital Status: Married  Catering manager Violence: Not At Risk (04/09/2024)   Humiliation, Afraid, Rape, and Kick questionnaire    Fear of Current or Ex-Partner: No    Emotionally Abused: No    Physically Abused: No    Sexually Abused: No    Outpatient Medications Prior to Visit  Medication Sig Dispense Refill   acetaminophen (TYLENOL) 650 MG CR tablet Take 650 mg by mouth every 8 (eight) hours as needed for pain.     amLODipine  (NORVASC ) 2.5 MG tablet TAKE 1 TABLET(2.5 MG) BY MOUTH DAILY 90 tablet 1   aspirin  EC 81 MG tablet  Take 1 tablet (81 mg total) by mouth daily.     calcium carbonate (OSCAL) 1500 (600 Ca) MG TABS tablet Take 600 mg of elemental calcium by mouth 2 (two) times daily with a meal. Also includes 20mcg of VitaminD (Patient  taking differently: Take 600 mg of elemental calcium by mouth as needed. Also includes 20mcg of VitaminD)     Cholecalciferol (VITAMIN D -3) 125 MCG (5000 UT) TABS Take 2 tablets by mouth 2 (two) times a week.     cycloSPORINE (RESTASIS) 0.05 % ophthalmic emulsion Place 1 drop into both eyes 2 (two) times daily. 0.4 mL    omeprazole  (PRILOSEC ) 20 MG capsule TAKE 1 CAPSULE(20 MG) BY MOUTH DAILY 30 MINUTES BEFORE BREAKFAST (Patient taking differently: as needed.) 90 capsule 1   No facility-administered medications prior to visit.    Allergies  Allergen Reactions   Codeine    Lactose Intolerance (Gi)     Review of Systems  Constitutional:  Negative for fever and malaise/fatigue.  HENT:  Positive for congestion and sinus pain. Negative for sore throat.   Eyes:  Negative for blurred vision.  Respiratory:  Negative for shortness of breath.   Cardiovascular:  Positive for leg swelling. Negative for chest pain and palpitations.  Gastrointestinal:  Positive for constipation and heartburn. Negative for abdominal pain, blood in stool and nausea.  Genitourinary:  Negative for dysuria and frequency.  Musculoskeletal:  Positive for back pain, joint pain and myalgias. Negative for falls.  Skin:  Negative for rash.  Neurological:  Negative for dizziness, loss of consciousness and headaches.  Endo/Heme/Allergies:  Negative for environmental allergies.  Psychiatric/Behavioral:  Negative for depression. The patient is not nervous/anxious.        Objective:    Physical Exam Constitutional:      General: She is not in acute distress.    Appearance: Normal appearance. She is well-developed. She is not toxic-appearing.  HENT:     Head: Normocephalic and atraumatic.     Right Ear:  External ear normal.     Left Ear: External ear normal.     Nose: Nose normal.  Eyes:     General:        Right eye: No discharge.        Left eye: No discharge.     Conjunctiva/sclera: Conjunctivae normal.  Neck:     Thyroid : No thyromegaly.  Cardiovascular:     Rate and Rhythm: Normal rate and regular rhythm.     Heart sounds: Normal heart sounds. No murmur heard. Pulmonary:     Effort: Pulmonary effort is normal. No respiratory distress.     Breath sounds: Normal breath sounds.  Abdominal:     General: Bowel sounds are normal.     Palpations: Abdomen is soft.     Tenderness: There is no abdominal tenderness. There is no guarding.  Musculoskeletal:        General: Normal range of motion.     Cervical back: Neck supple.  Lymphadenopathy:     Cervical: No cervical adenopathy.  Skin:    General: Skin is warm and dry.  Neurological:     Mental Status: She is alert and oriented to person, place, and time.  Psychiatric:        Mood and Affect: Mood normal.        Behavior: Behavior normal.        Thought Content: Thought content normal.        Judgment: Judgment normal.     BP 138/74   Pulse 72   Resp 16   Ht 5' (1.524 m)   Wt 174 lb 3.2 oz (79 kg)   SpO2 95%   BMI 34.02 kg/m  Wt Readings from Last 3 Encounters:  06/24/24 174  lb 3.2 oz (79 kg)  04/09/24 167 lb (75.8 kg)  12/27/23 167 lb (75.8 kg)    Diabetic Foot Exam - Simple   No data filed    Lab Results  Component Value Date   WBC 8.4 12/19/2023   HGB 14.0 12/19/2023   HCT 43.5 12/19/2023   PLT 179.0 12/19/2023   GLUCOSE 89 12/19/2023   CHOL 175 12/19/2023   TRIG 76.0 12/19/2023   HDL 66.60 12/19/2023   LDLCALC 93 12/19/2023   ALT 16 12/19/2023   AST 18 12/19/2023   NA 139 12/19/2023   K 3.8 12/19/2023   CL 104 12/19/2023   CREATININE 0.95 12/19/2023   BUN 15 12/19/2023   CO2 28 12/19/2023   TSH 1.70 12/19/2023   HGBA1C 6.1 12/19/2023    Lab Results  Component Value Date   TSH 1.70  12/19/2023   Lab Results  Component Value Date   WBC 8.4 12/19/2023   HGB 14.0 12/19/2023   HCT 43.5 12/19/2023   MCV 86.7 12/19/2023   PLT 179.0 12/19/2023   Lab Results  Component Value Date   NA 139 12/19/2023   K 3.8 12/19/2023   CO2 28 12/19/2023   GLUCOSE 89 12/19/2023   BUN 15 12/19/2023   CREATININE 0.95 12/19/2023   BILITOT 0.5 12/19/2023   ALKPHOS 71 12/19/2023   AST 18 12/19/2023   ALT 16 12/19/2023   PROT 7.6 12/19/2023   ALBUMIN 4.6 12/19/2023   CALCIUM 9.2 12/19/2023   GFR 62.81 12/19/2023   Lab Results  Component Value Date   CHOL 175 12/19/2023   Lab Results  Component Value Date   HDL 66.60 12/19/2023   Lab Results  Component Value Date   LDLCALC 93 12/19/2023   Lab Results  Component Value Date   TRIG 76.0 12/19/2023   Lab Results  Component Value Date   CHOLHDL 3 12/19/2023   Lab Results  Component Value Date   HGBA1C 6.1 12/19/2023       Assessment & Plan:  Essential hypertension Assessment & Plan: Well controlled, no changes to meds. Encouraged heart healthy diet such as the DASH diet and exercise as tolerated.    Orders: -     Comprehensive metabolic panel with GFR -     CBC with Differential/Platelet -     TSH  Hyperglycemia Assessment & Plan: hgba1c acceptable, minimize simple carbs. Increase exercise as tolerated.  Orders: -     Hemoglobin A1c  Hyperlipidemia, unspecified hyperlipidemia type Assessment & Plan: encouraged heart healthy diet, avoid trans fats, minimize simple carbs and saturated fats. Increase exercise as tolerated  Orders: -     Lipid panel  Osteopenia, unspecified location Assessment & Plan: Bone density shows osteopenia, which is thinner than normal but not as bad as osteoporosis. Recommend calcium intake of 1200 to 1500 mg daily, divided into roughly 3 doses. Best source is the diet and a single dairy serving is about 500 mg, a supplement of calcium citrate once or twice daily to balance diet is  fine if not getting enough in diet. Also need Vitamin D  2000 IU caps, 1 cap daily if not already taking vitamin D . Also recommend weight baring exercise on hips and upper body to keep bones strong    Vitamin D  deficiency -     VITAMIN D  25 Hydroxy (Vit-D Deficiency, Fractures)  Other orders -     Doxycycline  Hyclate; Take 1 tablet (100 mg total) by mouth 2 (two) times daily.  Dispense:  20 tablet; Refill: 0    Assessment and Plan Assessment & Plan Plantar Fasciitis Chronic plantar fasciitis with improvement. Previous sports medicine consultations ineffective. Discussed corticosteroid injection. - Continue icing foot twice daily. - Continue stretching exercises.  Sciatica Chronic sciatica with daily symptoms, primarily right leg. No bowel or bladder incontinence. Discussed potential lumbar disc issues. - Recommend physical therapy exercises. - Consider referral to sports medicine or orthopedics. - Consider lumbar spine x-ray.  Peripheral Edema Bilateral ankle swelling likely due to venous insufficiency. No heart failure signs. Discussed aging impact on venous return. - Advise reducing sodium intake. - Recommend elevating feet above heart for 15 minutes, three times a day. - Suggest wearing compression socks. - Encourage regular physical activity.  Sinusitis Suspected right-sided sinus infection with persistent symptoms. Discussed guaifenesin and nasal saline for drainage. - Continue guaifenesin. - Start nasal saline irrigation and Flonase . - Prescribe doxycycline  if symptoms do not improve.  Gastroesophageal Reflux Disease (GERD) Intermittent heartburn, primarily at night. Discussed dietary modifications and Tums use. - Advise taking omeprazole  daily if symptoms persist. - Recommend taking Tums at night as needed. - Advise avoiding spicy and fatty foods in the evening. - Suggest small meals at night and avoiding eating close to bedtime.  Constipation Chronic constipation  improved with MiraLAX. - Continue MiraLAX three times a week.  Calcium and Vitamin D  Supplementation Inconsistent Oscal intake due to dislike. Previous low vitamin D  levels. Discussed calcium citrate for better absorption. - Switch to calcium citrate (Citracal). - Ensure daily calcium intake of 1200-1500 mg, divided into three doses. - Consider using Tums as an evening calcium source. - Continue monitoring vitamin D  levels.  General Health Maintenance Discussed artificial sweeteners' impact on health and weight management. - Avoid artificial sweeteners, especially sucralose. - Encourage natural sweeteners in moderation. - Attend scheduled wellness appointment.     Harlene Horton, MD

## 2024-06-25 ENCOUNTER — Ambulatory Visit (INDEPENDENT_AMBULATORY_CARE_PROVIDER_SITE_OTHER): Payer: Medicare PPO | Admitting: Adult Health

## 2024-06-25 ENCOUNTER — Encounter (INDEPENDENT_AMBULATORY_CARE_PROVIDER_SITE_OTHER): Payer: Self-pay | Admitting: Adult Health

## 2024-06-25 ENCOUNTER — Ambulatory Visit: Payer: Self-pay | Admitting: Family Medicine

## 2024-06-25 VITALS — BP 131/77 | HR 54 | Temp 97.8°F | Ht 60.0 in | Wt 171.0 lb

## 2024-06-25 DIAGNOSIS — M8580 Other specified disorders of bone density and structure, unspecified site: Secondary | ICD-10-CM | POA: Diagnosis not present

## 2024-06-25 DIAGNOSIS — E559 Vitamin D deficiency, unspecified: Secondary | ICD-10-CM

## 2024-06-25 DIAGNOSIS — I10 Essential (primary) hypertension: Secondary | ICD-10-CM | POA: Diagnosis not present

## 2024-06-25 DIAGNOSIS — E669 Obesity, unspecified: Secondary | ICD-10-CM | POA: Diagnosis not present

## 2024-06-25 DIAGNOSIS — Z6833 Body mass index (BMI) 33.0-33.9, adult: Secondary | ICD-10-CM

## 2024-06-25 DIAGNOSIS — E785 Hyperlipidemia, unspecified: Secondary | ICD-10-CM

## 2024-06-25 DIAGNOSIS — R739 Hyperglycemia, unspecified: Secondary | ICD-10-CM | POA: Diagnosis not present

## 2024-06-25 DIAGNOSIS — E66811 Obesity, class 1: Secondary | ICD-10-CM

## 2024-06-25 LAB — LIPID PANEL
Cholesterol: 166 mg/dL (ref 0–200)
HDL: 71.4 mg/dL (ref >39.00)
LDL Cholesterol: 61 mg/dL (ref 0–99)
NonHDL: 94.81
Total CHOL/HDL Ratio: 2
Triglycerides: 171 mg/dL — ABNORMAL HIGH (ref 0.0–149.0)
VLDL: 34.2 mg/dL (ref 0.0–40.0)

## 2024-06-25 LAB — COMPREHENSIVE METABOLIC PANEL WITH GFR
ALT: 17 U/L (ref 0–35)
AST: 18 U/L (ref 0–37)
Albumin: 4.6 g/dL (ref 3.5–5.2)
Alkaline Phosphatase: 71 U/L (ref 39–117)
BUN: 18 mg/dL (ref 6–23)
CO2: 28 meq/L (ref 19–32)
Calcium: 9.7 mg/dL (ref 8.4–10.5)
Chloride: 102 meq/L (ref 96–112)
Creatinine, Ser: 0.89 mg/dL (ref 0.40–1.20)
GFR: 67.68 mL/min (ref >60.00)
Glucose, Bld: 91 mg/dL (ref 70–99)
Potassium: 4.1 meq/L (ref 3.5–5.1)
Sodium: 139 meq/L (ref 135–145)
Total Bilirubin: 0.3 mg/dL (ref 0.2–1.2)
Total Protein: 7.7 g/dL (ref 6.0–8.3)

## 2024-06-25 LAB — CBC WITH DIFFERENTIAL/PLATELET
Basophils Absolute: 0.1 K/uL (ref 0.0–0.1)
Basophils Relative: 1.2 % (ref 0.0–3.0)
Eosinophils Absolute: 0.1 K/uL (ref 0.0–0.7)
Eosinophils Relative: 0.7 % (ref 0.0–5.0)
HCT: 41.9 % (ref 36.0–46.0)
Hemoglobin: 13.8 g/dL (ref 12.0–15.0)
Lymphocytes Relative: 27.4 % (ref 12.0–46.0)
Lymphs Abs: 2.8 K/uL (ref 0.7–4.0)
MCHC: 32.8 g/dL (ref 30.0–36.0)
MCV: 85 fl (ref 78.0–100.0)
Monocytes Absolute: 0.9 K/uL (ref 0.1–1.0)
Monocytes Relative: 9.1 % (ref 3.0–12.0)
Neutro Abs: 6.3 K/uL (ref 1.4–7.7)
Neutrophils Relative %: 61.6 % (ref 43.0–77.0)
Platelets: 173 K/uL (ref 150.0–400.0)
RBC: 4.93 Mil/uL (ref 3.87–5.11)
RDW: 14.8 % (ref 11.5–15.5)
WBC: 10.3 K/uL (ref 4.0–10.5)

## 2024-06-25 LAB — VITAMIN D 25 HYDROXY (VIT D DEFICIENCY, FRACTURES): VITD: 71.04 ng/mL (ref 30.00–100.00)

## 2024-06-25 LAB — HEMOGLOBIN A1C: Hgb A1c MFr Bld: 6.3 % (ref 4.6–6.5)

## 2024-06-25 LAB — TSH: TSH: 1.09 u[IU]/mL (ref 0.35–5.50)

## 2024-06-25 NOTE — Progress Notes (Signed)
 WEIGHT SUMMARY AND BIOMETRICS  Vitals BP: 138/74 Pulse Rate: 72 SpO2: 95 %   Anthropometric Measurements Height: 5' (1.524 m) Weight: 174 lb 3.2 oz (79 kg) BMI (Calculated): 34.02 Weight at Last Visit: 167 lb Starting Weight: 171 lb   No data recorded Other Clinical Data Fasting: no Labs: no Today's Visit #: 69 Starting Date: 08/27/18    Chief Complaint:   OBESITY Jenna Johnson is here to discuss her progress with her obesity treatment plan.  She is on the practicing portion control and making smarter food choices, such as increasing vegetables and decreasing simple carbohydrates and states she is following her eating plan approximately 0 % of the time.  She states she is exercising Tai Chi/Walking 180/30 minutes 2/2 times per week.  Interim History:  She is being followed by [facility] every 5-6 months per pt's request. Her husband accompanies her to every OV.  Last OV at [facility] was 12/27/2023 weight-  12/27/23 09:00  Height 5' (1.524 m)  Weight 167 lb (75.8 kg)  BMI (Calculated) 32.62    She has Medicare Wellness OV on 04/09/2024 She has chronic f/u with PCP on 06/24/2024 Last set of fasting labs in system Jan 2025 per PCP She had fasting labs completed yesterday with PCP- results not yet released She was started on course of Doxycycline  for URI  Stress-  Her 66 year old was placed in Hospice He has been experiencing CVA He is FTT  Astronomer-  Workers at the home They estimate to eat out at least 3 meals per week.  Reviewed Bioempedence Results with pt: Muscle Mass: +0.4 lb Adipose Mass: +3.4 lbs  Subjective:   1. Essential hypertension PCP manages aspirin  EC 81 MG tablet  amLODipine  (NORVASC ) 2.5 MG tablet   BP repeat improved from first check She denies CP with exertion  2. Hyperglycemia She is not currently on antidiabetic medications Lab Results  Component Value Date   HGBA1C 6.1 12/19/2023   HGBA1C 6.2 09/05/2023   HGBA1C 6.0  05/24/2023    3. Hyperlipidemia, unspecified hyperlipidemia type She is not on statin therapy PCP repeated Lipid Panel- reults not yet released Lipid Panel     Component Value Date/Time   CHOL 175 12/19/2023 0945   CHOL 146 08/27/2018 1300   TRIG 76.0 12/19/2023 0945   HDL 66.60 12/19/2023 0945   HDL 61 08/27/2018 1300   CHOLHDL 3 12/19/2023 0945   VLDL 15.2 12/19/2023 0945   LDLCALC 93 12/19/2023 0945   LDLCALC 73 08/27/2018 1300   LABVLDL 12 08/27/2018 1300     4. Vitamin D  deficiency She uses Oscal irregularly due to its size and taste. She also takes Vit D 3 5,000 international units - takes 1 tab twice weekly  5. Osteopenia, unspecified location She has a history of low vitamin D  levels and has been taking Oscal irregularly due to its size and taste.  She consumes lactose-free dairy products and homemade yogurt to supplement her calcium intake.  06/10/2021 DEXA SCAN  World Health Organization (WHO) criteria for post-menopausal, Caucasian Women: Normal       T-score at or above -1 SD Osteopenia   T-score between -1 and -2.5 SD Osteoporosis T-score at or below -2.5 SD   RECOMMENDATION: 1. All patients should optimize calcium and vitamin D  intake. 2. Consider FDA-approved medical therapies in postmenopausal women and men aged 66 years and older, based on the following: a. A hip or vertebral(clinical or morphometric) fracture. b. T-Score < -2.5  at the femoral neck or spine after appropriate evaluation to exclude secondary causes c. Low bone mass (T-score between -1.0 and -2.5 at the femoral neck or spine) and a 10 year probability of a hip fracture >3% or a 10 year probability of major osteoporosis-related fracture > 20% based on the [place] -adapted WHO algorithm d. Clinical judgement and/or patient preferences may indicate treatment for people with 10-year fracture probabilities above or below these levels  Assessment/Plan:   1. Essential hypertension  (Primary) Continue regular cardiovascular exercise Increase lean protein intake  2. Hyperglycemia Continue regular cardiovascular exercise Limit sugar/simple CHO Increase lean protein intake  3. Hyperlipidemia, unspecified hyperlipidemia type Limit saturated fat Continue regular cardiovascular exercise  4. Vitamin D  deficiency Take Vit D 3 5,000 international units every other day  5. Osteopenia, unspecified location Take Vit D 3 5,000 international units every other day  6. Obesity, current BMI 33.5  Jenna Johnson is not currently in the action stage of change. As such, her goal is to get back to weightloss efforts . She has agreed to practicing portion control and making smarter food choices, such as increasing vegetables and decreasing simple carbohydrates.   1) Plate dinner on smaller plates 2) Increase plain water intake, strive for at least 20 oz/day 3) Replace Almond Milk with Fair Life Milk  Exercise goals: Older adults should follow the adult guidelines. When older adults cannot meet the adult guidelines, they should be as physically active as their abilities and conditions will allow.  Older adults should do exercises that maintain or improve balance if they are at risk of falling.  Older adults should determine their level of effort for physical activity relative to their level of fitness.  Older adults with chronic conditions should understand whether and how their conditions affect their ability to do regular physical activity safely.  Behavioral modification strategies: increasing lean protein intake, decreasing simple carbohydrates, increasing vegetables, increasing water intake, decreasing sodium intake, increasing high fiber foods, meal planning and cooking strategies, keeping healthy foods in the home, ways to avoid boredom eating, ways to avoid night time snacking, travel eating strategies, and planning for success.  Jenna Johnson has agreed to follow-up with our clinic  in 16 weeks. She was informed of the importance of frequent follow-up visits to maximize her success with intensive lifestyle modifications for her multiple health conditions.   Objective:   Height 5' (1.524 m). Body mass index is 34.02 kg/m.  General: Cooperative, alert, well developed, in no acute distress. HEENT: Conjunctivae and lids unremarkable. Cardiovascular: Regular rhythm.  Lungs: Normal work of breathing. Neurologic: No focal deficits.   Lab Results  Component Value Date   CREATININE 0.95 12/19/2023   BUN 15 12/19/2023   NA 139 12/19/2023   K 3.8 12/19/2023   CL 104 12/19/2023   CO2 28 12/19/2023   Lab Results  Component Value Date   ALT 16 12/19/2023   AST 18 12/19/2023   ALKPHOS 71 12/19/2023   BILITOT 0.5 12/19/2023   Lab Results  Component Value Date   HGBA1C 6.1 12/19/2023   HGBA1C 6.2 09/05/2023   HGBA1C 6.0 05/24/2023   HGBA1C 6.0 06/16/2022   HGBA1C 5.9 12/30/2021   Lab Results  Component Value Date   INSULIN  8.4 08/27/2018   Lab Results  Component Value Date   TSH 1.70 12/19/2023   Lab Results  Component Value Date   CHOL 175 12/19/2023   HDL 66.60 12/19/2023   LDLCALC 93 12/19/2023   TRIG 76.0 12/19/2023  CHOLHDL 3 12/19/2023   Lab Results  Component Value Date   VD25OH 70.11 12/19/2023   VD25OH 65.59 05/24/2023   VD25OH 66.49 06/16/2022   Lab Results  Component Value Date   WBC 8.4 12/19/2023   HGB 14.0 12/19/2023   HCT 43.5 12/19/2023   MCV 86.7 12/19/2023   PLT 179.0 12/19/2023   Lab Results  Component Value Date   IRON 86 06/03/2021   TIBC 275 06/03/2021   FERRITIN 171 06/03/2021   Attestation Statements:   Reviewed by clinician on day of visit: allergies, medications, problem list, medical history, surgical history, family history, social history, and previous encounter notes.  Due to length of time in between OV- extra time is needed for chart review, question/concerns. Husband is also at [facility] with multiple  questions himself.  Time spent on visit including pre-visit chart review and post-visit care and charting was 52 minutes.   I have reviewed the above documentation for accuracy and completeness, and I agree with the above. -  Jenna Johnson d. Trasean Delima, NP-C

## 2024-06-26 ENCOUNTER — Other Ambulatory Visit: Payer: Self-pay | Admitting: Medical Genetics

## 2024-08-04 ENCOUNTER — Other Ambulatory Visit: Payer: Self-pay | Admitting: Family Medicine

## 2024-08-04 DIAGNOSIS — I10 Essential (primary) hypertension: Secondary | ICD-10-CM

## 2024-08-06 ENCOUNTER — Other Ambulatory Visit

## 2024-08-06 DIAGNOSIS — Z006 Encounter for examination for normal comparison and control in clinical research program: Secondary | ICD-10-CM

## 2024-08-07 NOTE — Progress Notes (Unsigned)
   Jenna Ileana Collet, PhD, LAT, ATC acting as a scribe for Artist Lloyd, MD.  Jenna Johnson is a 68 y.o. female who presents to [facility] at [facility] today for hip and back pain. Pt was previously seen by Dr. Lloyd in 2023-24 for bilat plantar fascitis.   Today, pt c/o hip pain x ***. Pt locates pain to ***  Radiating pain: LE numbness/tingling: LE weakness: Aggravates: Treatments tried:  Dx imaging: 07/27/20 L-spine XR  Pertinent review of systems: ***  Relevant historical information: ***   Exam:  There were no vitals taken for this visit. General: Well Developed, well nourished, and in no acute distress.   MSK: ***    Lab and Radiology Results No results found for this or any previous visit (from the past 72 hours). No results found.     Assessment and Plan: 68 y.o. female with ***   PDMP not reviewed this encounter. No orders of the defined types were placed in this encounter.  No orders of the defined types were placed in this encounter.    Discussed warning signs or symptoms. Please see discharge instructions. Patient expresses understanding.   ***

## 2024-08-08 ENCOUNTER — Encounter: Payer: Self-pay | Admitting: Family Medicine

## 2024-08-08 ENCOUNTER — Ambulatory Visit (INDEPENDENT_AMBULATORY_CARE_PROVIDER_SITE_OTHER)

## 2024-08-08 ENCOUNTER — Ambulatory Visit: Admitting: Family Medicine

## 2024-08-08 VITALS — BP 130/80 | HR 74 | Ht 60.0 in | Wt 177.0 lb

## 2024-08-08 DIAGNOSIS — M5412 Radiculopathy, cervical region: Secondary | ICD-10-CM

## 2024-08-08 DIAGNOSIS — M5416 Radiculopathy, lumbar region: Secondary | ICD-10-CM

## 2024-08-08 DIAGNOSIS — M25551 Pain in right hip: Secondary | ICD-10-CM | POA: Diagnosis not present

## 2024-08-08 DIAGNOSIS — M25552 Pain in left hip: Secondary | ICD-10-CM | POA: Diagnosis not present

## 2024-08-08 DIAGNOSIS — M549 Dorsalgia, unspecified: Secondary | ICD-10-CM | POA: Diagnosis not present

## 2024-08-08 DIAGNOSIS — M4722 Other spondylosis with radiculopathy, cervical region: Secondary | ICD-10-CM | POA: Diagnosis not present

## 2024-08-08 DIAGNOSIS — M4724 Other spondylosis with radiculopathy, thoracic region: Secondary | ICD-10-CM | POA: Diagnosis not present

## 2024-08-08 MED ORDER — GABAPENTIN 100 MG PO CAPS: 100.0000 mg | ORAL_CAPSULE | Freq: Every evening | ORAL | 1 refills | Status: DC | PRN

## 2024-08-08 NOTE — Patient Instructions (Addendum)
 Thank you for coming in today.   Please get an Xray today before you leave   Rx sent in for Gabapentin  to take 100-300 mg at bedtime as needed.   Recommend trying a Purple Seat Cushion.   A referral for physical therapy has been submitted. A representative from the physical therapy office will contact you to coordinate scheduling after confirming your benefits with your insurance provider. If you do not hear from the physical therapy office within the next 1-2 weeks, please let us  know.  See you back in about 3 weeks, 1 week before your upcoming trip.

## 2024-08-16 LAB — GENECONNECT MOLECULAR SCREEN: Genetic Analysis Overall Interpretation: NEGATIVE

## 2024-08-19 ENCOUNTER — Ambulatory Visit: Payer: Self-pay | Admitting: Family Medicine

## 2024-08-19 NOTE — Progress Notes (Signed)
 Low back x-ray does show a little bit of arthritis.

## 2024-08-19 NOTE — Progress Notes (Signed)
 Cervical spine x-ray shows arthritis and the potential for nerves to be pinched.  If not improving with physical therapy we will consider an MRI to look at this more accurately.

## 2024-08-22 ENCOUNTER — Ambulatory Visit: Admitting: Family Medicine

## 2024-08-22 ENCOUNTER — Encounter: Payer: Self-pay | Admitting: Family Medicine

## 2024-08-22 ENCOUNTER — Other Ambulatory Visit: Payer: Self-pay

## 2024-08-22 VITALS — BP 122/84 | HR 75 | Ht 60.0 in | Wt 172.0 lb

## 2024-08-22 DIAGNOSIS — M25552 Pain in left hip: Secondary | ICD-10-CM

## 2024-08-22 DIAGNOSIS — M25551 Pain in right hip: Secondary | ICD-10-CM

## 2024-08-22 MED ORDER — PREDNISONE 50 MG PO TABS: ORAL_TABLET | ORAL | 0 refills | Status: DC

## 2024-08-22 NOTE — Progress Notes (Signed)
   I, Leotis Batter, CMA acting as a scribe for Artist Lloyd, MD.  Jenna Johnson is a 68 y.o. female who presents to [facility] at [facility] today for 2-wk f/u bilat hip pain, cervical and lumbar radiculitis. Pt was last seen by Dr. Lloyd on 08/08/24 and was advised to use a purple seat cushion, prescribed gabapentin , and referred to [facility] PT.  Today, pt reports some improvement of sx with PT, but does have some soreness after PT. Soreness in the mornings after laying on the left hip. Purple seat cushion has been beneficial.   Dx imaging: 08/08/24 L-spine & c-spine XR 07/27/20 L-spine XR   Pertinent review of systems: No fevers or chills  Relevant historical information: Hypertension   Exam:  BP 122/84   Pulse 75   Ht 5' (1.524 m)   Wt 172 lb (78 kg)   SpO2 96%   BMI 33.59 kg/m  General: Well Developed, well nourished, and in no acute distress.   MSK: C-spine normal-appearing.  Upper extremity strength is intact.  Left hip nontender palpation greater trochanter.  Tender palpation at the piriformis area.  Normal strength.       Assessment and Plan: 68 y.o. female with right arm paresthesias due to cervical radiculopathy improving with PT.  Left leg paresthesias either due to lumbar radiculopathy or piriformis syndrome.  Again this is improving a bit with PT.  She is traveling by car to [place]  at the end of the week.  She is feeling well enough now that we are not going to do an injection today.  I have prescribed prednisone  and gabapentin  that she can use if needed.  Check back as needed.  Continue PT on return to [place].   PDMP not reviewed this encounter. No orders of the defined types were placed in this encounter.  Meds ordered this encounter  Medications   predniSONE  (DELTASONE ) 50 MG tablet    Sig: Take 1 pill daily for 5 days    Dispense:  5 tablet    Refill:  0     Discussed warning signs or symptoms. Please see discharge  instructions. Patient expresses understanding.   The above documentation has been reviewed and is accurate and complete Artist Lloyd, M.D.

## 2024-08-22 NOTE — Patient Instructions (Addendum)
 Thank you for coming in today.   I've sent a prescription for Prednisone  to your pharmacy.

## 2024-08-28 ENCOUNTER — Other Ambulatory Visit: Payer: Self-pay | Admitting: Family Medicine

## 2024-08-28 DIAGNOSIS — Z1231 Encounter for screening mammogram for malignant neoplasm of breast: Secondary | ICD-10-CM

## 2024-09-12 DIAGNOSIS — M6281 Muscle weakness (generalized): Secondary | ICD-10-CM | POA: Diagnosis not present

## 2024-09-12 DIAGNOSIS — M5416 Radiculopathy, lumbar region: Secondary | ICD-10-CM | POA: Diagnosis not present

## 2024-09-12 DIAGNOSIS — M5412 Radiculopathy, cervical region: Secondary | ICD-10-CM | POA: Diagnosis not present

## 2024-09-12 DIAGNOSIS — M25552 Pain in left hip: Secondary | ICD-10-CM | POA: Diagnosis not present

## 2024-09-12 DIAGNOSIS — M25551 Pain in right hip: Secondary | ICD-10-CM | POA: Diagnosis not present

## 2024-09-16 DIAGNOSIS — M25551 Pain in right hip: Secondary | ICD-10-CM | POA: Diagnosis not present

## 2024-09-16 DIAGNOSIS — M5412 Radiculopathy, cervical region: Secondary | ICD-10-CM | POA: Diagnosis not present

## 2024-09-16 DIAGNOSIS — M25552 Pain in left hip: Secondary | ICD-10-CM | POA: Diagnosis not present

## 2024-09-16 DIAGNOSIS — M6281 Muscle weakness (generalized): Secondary | ICD-10-CM | POA: Diagnosis not present

## 2024-09-16 DIAGNOSIS — M5416 Radiculopathy, lumbar region: Secondary | ICD-10-CM | POA: Diagnosis not present

## 2024-09-18 ENCOUNTER — Ambulatory Visit
Admission: RE | Admit: 2024-09-18 | Discharge: 2024-09-18 | Disposition: A | Discharge status: Home / Self Care | Source: Ambulatory Visit | Attending: Family Medicine | Admitting: Family Medicine

## 2024-09-18 DIAGNOSIS — M25552 Pain in left hip: Secondary | ICD-10-CM | POA: Diagnosis not present

## 2024-09-18 DIAGNOSIS — M6281 Muscle weakness (generalized): Secondary | ICD-10-CM | POA: Diagnosis not present

## 2024-09-18 DIAGNOSIS — M25551 Pain in right hip: Secondary | ICD-10-CM | POA: Diagnosis not present

## 2024-09-18 DIAGNOSIS — M5412 Radiculopathy, cervical region: Secondary | ICD-10-CM | POA: Diagnosis not present

## 2024-09-18 DIAGNOSIS — Z1231 Encounter for screening mammogram for malignant neoplasm of breast: Secondary | ICD-10-CM

## 2024-09-18 DIAGNOSIS — M5416 Radiculopathy, lumbar region: Secondary | ICD-10-CM | POA: Diagnosis not present

## 2024-09-23 DIAGNOSIS — M25552 Pain in left hip: Secondary | ICD-10-CM | POA: Diagnosis not present

## 2024-09-23 DIAGNOSIS — M5416 Radiculopathy, lumbar region: Secondary | ICD-10-CM | POA: Diagnosis not present

## 2024-09-23 DIAGNOSIS — M5412 Radiculopathy, cervical region: Secondary | ICD-10-CM | POA: Diagnosis not present

## 2024-09-23 DIAGNOSIS — M25551 Pain in right hip: Secondary | ICD-10-CM | POA: Diagnosis not present

## 2024-09-23 DIAGNOSIS — M6281 Muscle weakness (generalized): Secondary | ICD-10-CM | POA: Diagnosis not present

## 2024-09-24 ENCOUNTER — Ambulatory Visit (HOSPITAL_BASED_OUTPATIENT_CLINIC_OR_DEPARTMENT_OTHER)
Admission: RE | Admit: 2024-09-24 | Discharge: 2024-09-24 | Disposition: A | Discharge status: Home / Self Care | Source: Ambulatory Visit | Attending: Family Medicine | Admitting: Family Medicine

## 2024-09-24 ENCOUNTER — Telehealth: Payer: Self-pay

## 2024-09-24 ENCOUNTER — Ambulatory Visit: Admitting: Family Medicine

## 2024-09-24 ENCOUNTER — Encounter: Payer: Self-pay | Admitting: Family Medicine

## 2024-09-24 ENCOUNTER — Ambulatory Visit (HOSPITAL_BASED_OUTPATIENT_CLINIC_OR_DEPARTMENT_OTHER)
Admission: RE | Admit: 2024-09-24 | Discharge: 2024-09-24 | Disposition: A | Source: Ambulatory Visit | Attending: Family Medicine | Admitting: Family Medicine

## 2024-09-24 VITALS — BP 130/84 | HR 88 | Ht 60.0 in | Wt 175.0 lb

## 2024-09-24 DIAGNOSIS — M542 Cervicalgia: Secondary | ICD-10-CM

## 2024-09-24 DIAGNOSIS — M50322 Other cervical disc degeneration at C5-C6 level: Secondary | ICD-10-CM | POA: Diagnosis not present

## 2024-09-24 DIAGNOSIS — M5412 Radiculopathy, cervical region: Secondary | ICD-10-CM | POA: Insufficient documentation

## 2024-09-24 DIAGNOSIS — M5416 Radiculopathy, lumbar region: Secondary | ICD-10-CM | POA: Diagnosis not present

## 2024-09-24 DIAGNOSIS — M25551 Pain in right hip: Secondary | ICD-10-CM | POA: Diagnosis not present

## 2024-09-24 DIAGNOSIS — M25552 Pain in left hip: Secondary | ICD-10-CM

## 2024-09-24 DIAGNOSIS — M47812 Spondylosis without myelopathy or radiculopathy, cervical region: Secondary | ICD-10-CM | POA: Diagnosis not present

## 2024-09-24 DIAGNOSIS — M4802 Spinal stenosis, cervical region: Secondary | ICD-10-CM | POA: Diagnosis not present

## 2024-09-24 DIAGNOSIS — M4726 Other spondylosis with radiculopathy, lumbar region: Secondary | ICD-10-CM | POA: Diagnosis not present

## 2024-09-24 DIAGNOSIS — M5021 Other cervical disc displacement,  high cervical region: Secondary | ICD-10-CM | POA: Diagnosis not present

## 2024-09-24 MED ORDER — PREDNISONE 50 MG PO TABS: ORAL_TABLET | ORAL | 0 refills | Status: DC

## 2024-09-24 NOTE — Addendum Note (Signed)
 Addended by: GEROME CROME R on: 09/24/2024 11:41 AM   Modules accepted: Orders

## 2024-09-24 NOTE — Telephone Encounter (Signed)
 Order placed for STAT MRI C-Spine and L-Spine. Please check prior auth requirements.

## 2024-09-24 NOTE — Progress Notes (Signed)
   I, Leotis Batter, CMA acting as a scribe for Artist Lloyd, MD.  Jenna Johnson is a 68 y.o. female who presents to [facility] at [facility] today for worsening  cervical and lumbar radiculitis. Pt was last seen by Dr. Lloyd on 08/22/24 and was prescribed prednisone  and gabapentin  to use if needed.  Today, pt reports taking Gabapentin  prn. Did not fill rx for Prednisone . Notes exacerbation of right arm and leg tingling.  Additionally she notes left leg weakness.  She was seen by physical therapy yesterday who identified weakness and recommended office visit.  No bowel or bladder dysfunction.  Dx imaging: 08/08/24 L-spine & c-spine XR 07/27/20 L-spine XR   Pertinent review of systems: No fevers or chills  Relevant historical information: History of a TIA   Exam:  BP (!) 146/90   Pulse 88   Ht 5' (1.524 m)   Wt 175 lb (79.4 kg)   SpO2 100%   BMI 34.18 kg/m  General: Well Developed, well nourished, and in no acute distress.   MSK: C-spine: Normal-appearing normal motion.   Mild reduced strength left arm abduction otherwise intact.  Reflexes are intact.  L-spine nontender palpation midline normal lumbar motion.  Mild reduced strength left knee flexion.  Otherwise intact.  Reflexes are intact.     Assessment and Plan: 68 y.o. female with right arm and leg paresthesia and mild weakness left arm and left leg.  This is concerning for either 2 separate nerve impingements or a mild version of a Brown-Squard lesion in the cervical spine.  It would be hard for a brain lesion to produce the symptoms. Plan for prednisone  and MRI C-spine and L-spine done somewhat urgently.  If her weakness worsens she should go to the emergency room but we can wait a few days to get an MRI outside of the emergency room.  Anticipate an epidural steroid injection following the MRI.  Potentially may need surgical consultation.  Anticipate recheck in clinic after epidural.   PDMP not  reviewed this encounter. No orders of the defined types were placed in this encounter.  Meds ordered this encounter  Medications   predniSONE  (DELTASONE ) 50 MG tablet    Sig: Take 1 pill daily for 5 days    Dispense:  5 tablet    Refill:  0     Discussed warning signs or symptoms. Please see discharge instructions. Patient expresses understanding.   The above documentation has been reviewed and is accurate and complete Artist Lloyd, M.D.

## 2024-09-24 NOTE — Patient Instructions (Addendum)
 Thank you for coming in today.   We've placed an order for a STAT MRI of your neck and back.   Will consider injection based on MRI findings.

## 2024-09-25 ENCOUNTER — Ambulatory Visit: Payer: Self-pay | Admitting: Family Medicine

## 2024-09-25 DIAGNOSIS — M25552 Pain in left hip: Secondary | ICD-10-CM | POA: Diagnosis not present

## 2024-09-25 DIAGNOSIS — M5412 Radiculopathy, cervical region: Secondary | ICD-10-CM | POA: Diagnosis not present

## 2024-09-25 DIAGNOSIS — M6281 Muscle weakness (generalized): Secondary | ICD-10-CM | POA: Diagnosis not present

## 2024-09-25 DIAGNOSIS — M25551 Pain in right hip: Secondary | ICD-10-CM | POA: Diagnosis not present

## 2024-09-25 DIAGNOSIS — M5416 Radiculopathy, lumbar region: Secondary | ICD-10-CM | POA: Diagnosis not present

## 2024-09-25 NOTE — Progress Notes (Signed)
 Cervical spine MRI shows a pinched nerve affecting the right arm.  No large spinal cord impingement is present which is great news.  I am ordering an epidural steroid injection.  You should hear soon about scheduling.  I think this can be quite helpful.

## 2024-09-25 NOTE — Progress Notes (Signed)
 Lumbar spine MRI does not show significant pinched nerves.  I have a good explanation for the arm symptoms but not the leg symptoms.  Lets see how you do after the epidural steroid injection and if not better we will proceed with further testing.

## 2024-09-30 DIAGNOSIS — M5416 Radiculopathy, lumbar region: Secondary | ICD-10-CM | POA: Diagnosis not present

## 2024-09-30 DIAGNOSIS — M25551 Pain in right hip: Secondary | ICD-10-CM | POA: Diagnosis not present

## 2024-09-30 DIAGNOSIS — M6281 Muscle weakness (generalized): Secondary | ICD-10-CM | POA: Diagnosis not present

## 2024-09-30 DIAGNOSIS — M5412 Radiculopathy, cervical region: Secondary | ICD-10-CM | POA: Diagnosis not present

## 2024-09-30 DIAGNOSIS — M25552 Pain in left hip: Secondary | ICD-10-CM | POA: Diagnosis not present

## 2024-10-03 DIAGNOSIS — R269 Unspecified abnormalities of gait and mobility: Secondary | ICD-10-CM | POA: Diagnosis not present

## 2024-10-03 DIAGNOSIS — E669 Obesity, unspecified: Secondary | ICD-10-CM | POA: Diagnosis not present

## 2024-10-03 DIAGNOSIS — Z809 Family history of malignant neoplasm, unspecified: Secondary | ICD-10-CM | POA: Diagnosis not present

## 2024-10-03 DIAGNOSIS — Z8249 Family history of ischemic heart disease and other diseases of the circulatory system: Secondary | ICD-10-CM | POA: Diagnosis not present

## 2024-10-03 DIAGNOSIS — Z833 Family history of diabetes mellitus: Secondary | ICD-10-CM | POA: Diagnosis not present

## 2024-10-03 DIAGNOSIS — Z7982 Long term (current) use of aspirin: Secondary | ICD-10-CM | POA: Diagnosis not present

## 2024-10-03 DIAGNOSIS — N182 Chronic kidney disease, stage 2 (mild): Secondary | ICD-10-CM | POA: Diagnosis not present

## 2024-10-03 DIAGNOSIS — I129 Hypertensive chronic kidney disease with stage 1 through stage 4 chronic kidney disease, or unspecified chronic kidney disease: Secondary | ICD-10-CM | POA: Diagnosis not present

## 2024-10-03 DIAGNOSIS — K219 Gastro-esophageal reflux disease without esophagitis: Secondary | ICD-10-CM | POA: Diagnosis not present

## 2024-10-07 DIAGNOSIS — M6281 Muscle weakness (generalized): Secondary | ICD-10-CM | POA: Diagnosis not present

## 2024-10-07 DIAGNOSIS — M5412 Radiculopathy, cervical region: Secondary | ICD-10-CM | POA: Diagnosis not present

## 2024-10-07 DIAGNOSIS — M25552 Pain in left hip: Secondary | ICD-10-CM | POA: Diagnosis not present

## 2024-10-07 DIAGNOSIS — M25551 Pain in right hip: Secondary | ICD-10-CM | POA: Diagnosis not present

## 2024-10-07 DIAGNOSIS — M5416 Radiculopathy, lumbar region: Secondary | ICD-10-CM | POA: Diagnosis not present

## 2024-10-09 NOTE — Discharge Instructions (Signed)

## 2024-10-10 ENCOUNTER — Ambulatory Visit
Admission: RE | Admit: 2024-10-10 | Discharge: 2024-10-10 | Disposition: A | Discharge status: Home / Self Care | Source: Ambulatory Visit | Attending: Family Medicine | Admitting: Family Medicine

## 2024-10-10 DIAGNOSIS — M5412 Radiculopathy, cervical region: Secondary | ICD-10-CM

## 2024-10-11 ENCOUNTER — Other Ambulatory Visit: Payer: Self-pay | Admitting: Family Medicine

## 2024-10-11 DIAGNOSIS — M5412 Radiculopathy, cervical region: Secondary | ICD-10-CM

## 2024-10-14 ENCOUNTER — Ambulatory Visit
Admission: RE | Admit: 2024-10-14 | Discharge: 2024-10-14 | Disposition: A | Source: Ambulatory Visit | Attending: Family Medicine | Admitting: Family Medicine

## 2024-10-14 DIAGNOSIS — M5412 Radiculopathy, cervical region: Secondary | ICD-10-CM

## 2024-10-14 DIAGNOSIS — M4722 Other spondylosis with radiculopathy, cervical region: Secondary | ICD-10-CM | POA: Diagnosis not present

## 2024-10-14 MED ORDER — TRIAMCINOLONE ACETONIDE 40 MG/ML IJ SUSP (RADIOLOGY)
60.0000 mg | Freq: Once | INTRAMUSCULAR | Status: AC
  Administered 2024-10-14: 60 mg via EPIDURAL

## 2024-10-14 MED ORDER — IOPAMIDOL (ISOVUE-M 300) INJECTION 61%
1.0000 mL | Freq: Once | INTRAMUSCULAR | Status: AC | PRN
  Administered 2024-10-14: 1 mL via EPIDURAL

## 2024-10-14 NOTE — Discharge Instructions (Signed)

## 2024-10-17 DIAGNOSIS — M5412 Radiculopathy, cervical region: Secondary | ICD-10-CM | POA: Diagnosis not present

## 2024-10-17 DIAGNOSIS — M25551 Pain in right hip: Secondary | ICD-10-CM | POA: Diagnosis not present

## 2024-10-17 DIAGNOSIS — M5416 Radiculopathy, lumbar region: Secondary | ICD-10-CM | POA: Diagnosis not present

## 2024-10-17 DIAGNOSIS — M6281 Muscle weakness (generalized): Secondary | ICD-10-CM | POA: Diagnosis not present

## 2024-10-17 DIAGNOSIS — M25552 Pain in left hip: Secondary | ICD-10-CM | POA: Diagnosis not present

## 2024-10-21 ENCOUNTER — Encounter (INDEPENDENT_AMBULATORY_CARE_PROVIDER_SITE_OTHER): Payer: Self-pay | Admitting: Adult Health

## 2024-10-21 ENCOUNTER — Ambulatory Visit (INDEPENDENT_AMBULATORY_CARE_PROVIDER_SITE_OTHER): Payer: Self-pay | Admitting: Adult Health

## 2024-10-21 VITALS — BP 136/82 | HR 72 | Temp 97.9°F | Ht 60.0 in | Wt 170.0 lb

## 2024-10-21 DIAGNOSIS — I10 Essential (primary) hypertension: Secondary | ICD-10-CM

## 2024-10-21 DIAGNOSIS — M25552 Pain in left hip: Secondary | ICD-10-CM | POA: Diagnosis not present

## 2024-10-21 DIAGNOSIS — M6281 Muscle weakness (generalized): Secondary | ICD-10-CM | POA: Diagnosis not present

## 2024-10-21 DIAGNOSIS — R739 Hyperglycemia, unspecified: Secondary | ICD-10-CM | POA: Diagnosis not present

## 2024-10-21 DIAGNOSIS — M25551 Pain in right hip: Secondary | ICD-10-CM | POA: Diagnosis not present

## 2024-10-21 DIAGNOSIS — E785 Hyperlipidemia, unspecified: Secondary | ICD-10-CM

## 2024-10-21 DIAGNOSIS — E559 Vitamin D deficiency, unspecified: Secondary | ICD-10-CM

## 2024-10-21 DIAGNOSIS — M5416 Radiculopathy, lumbar region: Secondary | ICD-10-CM | POA: Diagnosis not present

## 2024-10-21 DIAGNOSIS — E669 Obesity, unspecified: Secondary | ICD-10-CM | POA: Diagnosis not present

## 2024-10-21 DIAGNOSIS — M5412 Radiculopathy, cervical region: Secondary | ICD-10-CM | POA: Diagnosis not present

## 2024-10-21 DIAGNOSIS — E66811 Obesity, class 1: Secondary | ICD-10-CM

## 2024-10-21 DIAGNOSIS — Z6833 Body mass index (BMI) 33.0-33.9, adult: Secondary | ICD-10-CM

## 2024-10-21 NOTE — Progress Notes (Signed)
 WEIGHT SUMMARY AND BIOMETRICS  Vitals Temp: 97.9 F (36.6 C) BP: 136/82 Pulse Rate: 72 SpO2: 99 %   Anthropometric Measurements Height: 5' (1.524 m) Weight: 170 lb (77.1 kg) BMI (Calculated): 33.2 Weight at Last Visit: 171lb Weight Lost Since Last Visit: 1lb Weight Gained Since Last Visit: 0lb Starting Weight: 171lb Total Weight Loss (lbs): 1 lb (0.454 kg)   Body Composition  Body Fat %: 35.6 % Fat Mass (lbs): 60.6 lbs Muscle Mass (lbs): 104.2 lbs Total Body Water (lbs): 64.8 lbs Visceral Fat Rating : 11   Other Clinical Data RMR: 1138 Fasting: No Labs: No Today's Visit #: 40 Starting Date: 08/27/18   Chief Complaint:   OBESITY Jenna Johnson is here to discuss her progress with her obesity treatment plan.  She is on the practicing portion control and making smarter food choices, such as increasing vegetables and decreasing simple carbohydrates and states she is following her eating plan approximately 0 % of the time.  She states she is exercising Out Patient Physical Therapy 60 mins x 1, Walking 0.5-1 mile at least once per week.  Interim History:   Her wonderful father passed away at age 69 She and her husband travelled for 17 days over 10 states and covered >4,000 miles during the funeral proceedings  Her sister's husband suddenly passed away the day after the funeral.  Reviewed Bioimpedance Results with pt: Muscle Mass: +1.6 lbs Adipose Mass: -2.8 lbs  Fall 2025- She is being closely followed by Sports Med for: bilat hip pain, cervical and lumbar radiculitis.  Reviewed recent MRI imaging Sports Med provided epidural steroid injection  She is being followed by [facility] every 5-6 months per pt's request. Her husband accompanies her to every OV. Lat OV at [facility] was on 06/25/2024  Subjective:   1. Hyperlipidemia, unspecified hyperlipidemia type Lipid Panel     Component Value Date/Time   CHOL 166 06/24/2024 1503   CHOL 146 08/27/2018 1300   TRIG  171.0 (H) 06/24/2024 1503   HDL 71.40 06/24/2024 1503   HDL 61 08/27/2018 1300   CHOLHDL 2 06/24/2024 1503   VLDL 34.2 06/24/2024 1503   LDLCALC 61 06/24/2024 1503   LDLCALC 73 08/27/2018 1300   LABVLDL 12 08/27/2018 1300     2. Essential hypertension BP stable at OV PCP manages  aspirin  EC 81 MG tablet  amLODipine  (NORVASC ) 2.5 MG tablet   2/25/ 2022 ECHO FINDINGS   Left Ventricle: Left ventricular ejection fraction, by estimation, is 70  to 75%. The left ventricle has hyperdynamic function. The left ventricle  has no regional wall motion abnormalities. The average left ventricular   3. Hyperglycemia  Latest Reference Range & Units 09/05/23 10:59 12/19/23 09:45 06/24/24 15:03  Glucose 70 - 99 mg/dL 95 89 91   Lab Results  Component Value Date   HGBA1C 6.3 06/24/2024   HGBA1C 6.1 12/19/2023   HGBA1C 6.2 09/05/2023    She is not currently on any antidiabetic medications Her father was diabetic- recently passed away at age 69  4. Vitamin D  deficiency  Latest Reference Range & Units 05/24/23 09:57 12/19/23 09:45 06/24/24 15:03  VITD 30.00 - 100.00 ng/mL 65.59 70.11 71.04   Cholecalciferol (VITAMIN D -3) 125 MCG (5000 UT) TABS Take 2 tablets by mouth 2 (two) times a week.    Assessment/Plan:   1. Hyperlipidemia, unspecified hyperlipidemia type Limit Sat Fat intake and increase regular cardiovascular exercise  2. Essential hypertension (Primary) Limit Na+ intake Increase regular cardiovascular exercise  3. Hyperglycemia Limit simple CHO/sugar intake Increase regular cardiovascular exercise  4. Vitamin D  deficiency Continue  Cholecalciferol (VITAMIN D -3) 125 MCG (5000 UT) TABS Take 2 tablets by mouth 2 (two) times a week.    5. Obesity, current BMI 33.3  [name] is not currently in the action stage of change. As such, her goal is to get back to weightloss efforts . She has agreed to practicing portion control and making smarter food choices, such as  increasing vegetables and decreasing simple carbohydrates.   Exercise goals: Older adults should follow the adult guidelines. When older adults cannot meet the adult guidelines, they should be as physically active as their abilities and conditions will allow.  Older adults should do exercises that maintain or improve balance if they are at risk of falling.  Older adults should determine their level of effort for physical activity relative to their level of fitness.  Older adults with chronic conditions should understand whether and how their conditions affect their ability to do regular physical activity safely.  Behavioral modification strategies: increasing lean protein intake, decreasing simple carbohydrates, increasing vegetables, increasing water intake, no skipping meals, meal planning and cooking strategies, keeping healthy foods in the home, ways to avoid boredom eating, travel eating strategies, holiday eating strategies , and planning for success.  [name] has agreed to follow-up with our clinic in 12 weeks. She was informed of the importance of frequent follow-up visits to maximize her success with intensive lifestyle modifications for her multiple health conditions.   Objective:   Blood pressure 136/82, pulse 72, temperature 97.9 F (36.6 C), height 5' (1.524 m), weight 170 lb (77.1 kg), SpO2 99%. Body mass index is 33.2 kg/m.  General: Cooperative, alert, well developed, in no acute distress. HEENT: Conjunctivae and lids unremarkable. Cardiovascular: Regular rhythm.  Lungs: Normal work of breathing. Neurologic: No focal deficits.   Lab Results  Component Value Date   CREATININE 0.89 06/24/2024   BUN 18 06/24/2024   NA 139 06/24/2024   K 4.1 06/24/2024   CL 102 06/24/2024   CO2 28 06/24/2024   Lab Results  Component Value Date   ALT 17 06/24/2024   AST 18 06/24/2024   ALKPHOS 71 06/24/2024   BILITOT 0.3 06/24/2024   Lab Results  Component Value Date   HGBA1C 6.3  06/24/2024   HGBA1C 6.1 12/19/2023   HGBA1C 6.2 09/05/2023   HGBA1C 6.0 05/24/2023   HGBA1C 6.0 06/16/2022   Lab Results  Component Value Date   INSULIN  8.4 08/27/2018   Lab Results  Component Value Date   TSH 1.09 06/24/2024   Lab Results  Component Value Date   CHOL 166 06/24/2024   HDL 71.40 06/24/2024   LDLCALC 61 06/24/2024   TRIG 171.0 (H) 06/24/2024   CHOLHDL 2 06/24/2024   Lab Results  Component Value Date   VD25OH 71.04 06/24/2024   VD25OH 70.11 12/19/2023   VD25OH 65.59 05/24/2023   Lab Results  Component Value Date   WBC 10.3 06/24/2024   HGB 13.8 06/24/2024   HCT 41.9 06/24/2024   MCV 85.0 06/24/2024   PLT 173.0 06/24/2024   Lab Results  Component Value Date   IRON 86 06/03/2021   TIBC 275 06/03/2021   FERRITIN 171 06/03/2021   Attestation Statements:   Reviewed by clinician on day of visit: allergies, medications, problem list, medical history, surgical history, family history, social history, and previous encounter notes.  Time spent on visit including pre-visit chart review and post-visit care and charting  was 28 minutes.   I have reviewed the above documentation for accuracy and completeness, and I agree with the above. -  Luana Tatro d. Emalynn Clewis, NP-C

## 2024-10-28 DIAGNOSIS — M6281 Muscle weakness (generalized): Secondary | ICD-10-CM | POA: Diagnosis not present

## 2024-10-28 DIAGNOSIS — M25552 Pain in left hip: Secondary | ICD-10-CM | POA: Diagnosis not present

## 2024-10-28 DIAGNOSIS — M25551 Pain in right hip: Secondary | ICD-10-CM | POA: Diagnosis not present

## 2024-10-28 DIAGNOSIS — M5416 Radiculopathy, lumbar region: Secondary | ICD-10-CM | POA: Diagnosis not present

## 2024-10-28 DIAGNOSIS — M5412 Radiculopathy, cervical region: Secondary | ICD-10-CM | POA: Diagnosis not present

## 2024-11-04 DIAGNOSIS — M25552 Pain in left hip: Secondary | ICD-10-CM | POA: Diagnosis not present

## 2024-11-04 DIAGNOSIS — M5416 Radiculopathy, lumbar region: Secondary | ICD-10-CM | POA: Diagnosis not present

## 2024-11-04 DIAGNOSIS — M5412 Radiculopathy, cervical region: Secondary | ICD-10-CM | POA: Diagnosis not present

## 2024-11-04 DIAGNOSIS — M25551 Pain in right hip: Secondary | ICD-10-CM | POA: Diagnosis not present

## 2024-11-04 DIAGNOSIS — M6281 Muscle weakness (generalized): Secondary | ICD-10-CM | POA: Diagnosis not present

## 2024-11-12 ENCOUNTER — Encounter: Payer: Self-pay | Admitting: Family Medicine

## 2024-11-12 ENCOUNTER — Ambulatory Visit: Admitting: Family Medicine

## 2024-11-12 VITALS — BP 146/82 | HR 79 | Ht 60.0 in | Wt 176.0 lb

## 2024-11-12 DIAGNOSIS — M5412 Radiculopathy, cervical region: Secondary | ICD-10-CM | POA: Diagnosis not present

## 2024-11-12 DIAGNOSIS — M25551 Pain in right hip: Secondary | ICD-10-CM | POA: Diagnosis not present

## 2024-11-12 DIAGNOSIS — M25552 Pain in left hip: Secondary | ICD-10-CM | POA: Diagnosis not present

## 2024-11-12 NOTE — Progress Notes (Signed)
 I, Leotis Batter, CMA acting as a scribe for Artist Lloyd, MD.  Jenna Johnson is a 68 y.o. female who presents to [facility] at [facility] today for paraesthesia. Pt was last seen by Dr. Lloyd on 09/24/24 for cervical radiculitis and STAT MRI's were ordered.  Based on MRI findings, cervical ESI was ordered. Done on 10/24/24.  Today, pt reports some continued n/t in B LE, but sx have improved with PT. Also reports improvement of neck sx. Compliant with HEP. Injection has also been helpful.   Dx imaging: 09/24/24 L-spine & c-spine MRI 08/08/24 L-spine & c-spine XR 07/27/20 L-spine XR  Pertinent review of systems: No fevers or chills  Relevant historical information: Hypertension   Exam:  BP (!) 146/82   Pulse 79   Ht 5' (1.524 m)   Wt 176 lb (79.8 kg)   SpO2 97%   BMI 34.37 kg/m  General: Well Developed, well nourished, and in no acute distress.   MSK: C-spine slight decreased range of motion.  Upper Smir strength is intact.    Lab and Radiology Results  MRI CERVICAL SPINE WITHOUT CONTRAST 09/24/2024 03:44:25 PM   TECHNIQUE: Multiplanar multisequence MRI of the cervical spine was performed.   COMPARISON: Cervical spine radiographs 08/08/2024.   CLINICAL HISTORY: Chronic neck pain, right arm pain, and lower back pain radiating to bilateral legs.   FINDINGS:   BONES AND ALIGNMENT: Cervical spine straightening. No listhesis. No fracture, suspicious marrow lesion, or significant marrow edema.   SPINAL CORD: Normal spinal cord size. No abnormal spinal cord signal.   SOFT TISSUES: No paraspinal mass.   C2-C3: Tiny central disc protrusion and mild left facet arthrosis without stenosis.   C3-C4: Negative.   C4-C5: Negative.   C5-C6: Moderate disc space narrowing. Right eccentric disc bulging and right uncovertebral spurring result in moderate right neural foraminal stenosis with potential right C6 nerve root impingement. No  significant spinal stenosis.   C6-C7: Mild spondylosis without disc herniation or stenosis.   C7-T1: Mild facet hypertrophy without disc herniation or stenosis.   IMPRESSION: 1. Cervical disc degeneration greatest at C5-C6 where there is moderate right neural foraminal stenosis. 2. No significant spinal canal stenosis.   Electronically signed by: Dasie Hamburg MD 09/24/2024 04:07 PM EDT RP Workstation: [id number]    MRI LUMBAR SPINE 09/24/2024 03:44:43 PM   TECHNIQUE: Multiplanar multisequence MRI of the lumbar spine was performed without the administration of intravenous contrast.   COMPARISON: None available.   CLINICAL HISTORY: Lumbar radiculopathy, symptoms persist with > 6 wks treatment. Right arm pain and lower back pain radiates bilateral leg. NKI; No hx of L-Spine surg. ; No hx. Of CA   FINDINGS:   BONES AND ALIGNMENT: Alignment is maintained. The lowest well-formed disc space is labeled L5-S1. Normal vertebral body heights. Bone marrow signal is unremarkable.   SPINAL CORD: The conus medullaris extends to the L1-L2 level. Visualized cauda equina nerve roots are unremarkable.   SOFT TISSUES: No paraspinal mass.   T12-L1: No significant spinal canal or foraminal stenosis.   L1-L2: No significant disc herniation. No significant spinal canal or foraminal stenosis.   L2-L3: No significant disc herniation. No significant spinal canal or foraminal stenosis. Mild facet arthrosis.   L3-L4: No significant disc herniation. No significant spinal canal or foraminal stenosis. Mild facet arthrosis.   L4-L5: No significant disc herniation. No significant spinal canal or foraminal stenosis. Mild facet arthrosis.   L5-S1: No significant disc herniation. No significant spinal canal  or foraminal stenosis. Mild bilateral facet arthrosis.   IMPRESSION: 1. No spinal canal stenosis or neural foraminal stenosis in the lumbar spine. Mild facet arthrosis at multiple  levels.   Electronically signed by: Donnice Mania MD 09/24/2024 04:10 PM EDT RP Workstation: [id number]  I, Artist Lloyd, personally (independently) visualized and performed the interpretation of the images attached in this note.    Assessment and Plan: 68 y.o. female with cervical radiculopathy and right leg discomfort.  Cervical radiculopathy significantly improved with cervical spine epidural steroid injection and physical therapy.  Leg pain has improved with PT as well.  Plan to continue PT and can repeat epidural steroid injection in the near future if needed.  Consider surgical consultation if needed.  Check back as needed.   PDMP not reviewed this encounter. No orders of the defined types were placed in this encounter.  No orders of the defined types were placed in this encounter.    Discussed warning signs or symptoms. Please see discharge instructions. Patient expresses understanding.   The above documentation has been reviewed and is accurate and complete Artist Lloyd, M.D.

## 2024-11-12 NOTE — Patient Instructions (Addendum)
 Thank you for coming in today.   Let me know if you need another injection  HAPPY HOLIDAYS!!

## 2024-11-26 ENCOUNTER — Ambulatory Visit: Payer: Self-pay

## 2024-11-26 NOTE — Telephone Encounter (Signed)
 FYI Only or Action Required?: FYI only for provider: appointment scheduled on 11/27/2024 at 9 AM.  Patient was last seen in primary care on 10/21/2024 by Jonel Rockie BIRCH, NP.  Called Nurse Triage reporting Hypertension.  Symptoms began today.  Interventions attempted: Rest, hydration, or home remedies.  Symptoms are: unchanged.  Triage Disposition: See PCP When Office is Open (Within 3 Days)  Patient/caregiver understands and will follow disposition?: Yes  Copied from CRM #[id number]. Topic: Clinical - Medical Advice >> Nov 26, 2024  2:46 PM Alexandria E wrote: Reason for CRM: Patient was just at [facility] with BP readings of 181/98 and 169/98 and is requesting to speak with a nurse. Reason for Disposition  Systolic BP >= 160 OR Diastolic >= 100  Answer Assessment - Initial Assessment Questions Patient reports high readings today. No symptoms today. Patient is scheduled to see a provider in the morning at PCP office.   1. BLOOD PRESSURE: What is your blood pressure? Did you take at least two measurements 5 minutes apart?     181/98  169/98 2. ONSET: When did you take your blood pressure?     today 3. HOW: How did you take your blood pressure? (e.g., automatic home BP monitor, visiting nurse)     Done at [facility] 4. HISTORY: Do you have a history of high blood pressure?     yes 5. MEDICINES: Are you taking any medicines for blood pressure? Have you missed any doses recently?     no 6. OTHER SYMPTOMS: Do you have any symptoms? (e.g., blurred vision, chest pain, difficulty breathing, headache, weakness)     no  Protocols used: Blood Pressure - High-A-AH

## 2024-11-26 NOTE — Telephone Encounter (Signed)
 Appt scheduled

## 2024-11-27 ENCOUNTER — Encounter: Payer: Self-pay | Admitting: Family Medicine

## 2024-11-27 ENCOUNTER — Ambulatory Visit: Admitting: Family Medicine

## 2024-11-27 VITALS — BP 142/82 | HR 74 | Temp 98.0°F | Resp 16 | Ht 61.0 in | Wt 174.0 lb

## 2024-11-27 DIAGNOSIS — I10 Essential (primary) hypertension: Secondary | ICD-10-CM | POA: Diagnosis not present

## 2024-11-27 MED ORDER — AMLODIPINE BESYLATE 5 MG PO TABS: 5.0000 mg | ORAL_TABLET | Freq: Every day | ORAL | 3 refills | Status: DC

## 2024-11-27 NOTE — Progress Notes (Signed)
 Chief Complaint  Patient presents with   Hypertension    BP    Subjective Jenna Johnson is a 68 y.o. female who presents for hypertension follow up. She does monitor home blood pressures. Blood pressures ranging from 140-180's/80-90's on average. She is compliant with medication- Norvasc  2.5 mg/d. Patient has these side effects of medication: none She is usually adhering to a healthy diet overall. Current exercise: calisthenics, PT for sciatica, walking, cycling No CP or SOB.    Past Medical History:  Diagnosis Date   Anemia    long time ago per pt    Arthritis of knee, right    and left knee    Back pain    Bursitis of both hips    Cataract    forming  right eye she thinks    Dermatitis 09/18/2013   Endometriosis    GERD (gastroesophageal reflux disease)    changed diet and better    H/O atrial septal defect    Hypertension    IBS (irritable bowel syndrome)    Lactose intolerance    Myalgia and myositis 09/18/2013   Osteopenia    mild    TIA (transient ischemic attack) 2005    Exam BP (!) 142/82 (BP Location: Left Arm, Patient Position: Sitting)   Pulse 74   Temp 98 F (36.7 C) (Oral)   Resp 16   Ht 5' 1 (1.549 m)   Wt 174 lb (78.9 kg)   SpO2 97%   BMI 32.88 kg/m  General:  well developed, well nourished, in no apparent distress Heart: RRR, no bruits, no LE edema Lungs: clear to auscultation, no accessory muscle use Psych: well oriented with normal range of affect and appropriate judgment/insight  Essential hypertension - Plan: amLODipine  (NORVASC ) 5 MG tablet  Chronic, not controlled. Increase Norvasc  from 2.5 mg/d to 5 mg/d. Cont to monitor BP at home. Counseled on diet and exercise. F/u in 1 mo. The patient voiced understanding and agreement to the plan.  Mabel Mt [place], DO 11/27/2024  9:44 AM

## 2024-11-27 NOTE — Patient Instructions (Addendum)
 Continue to monitor your blood pressure at home.   Keep the diet clean and stay active.  When you check your BP, make sure you have been doing something calm/relaxing 5 minutes prior to checking. Both feet should be flat on the floor and you should be sitting. Use your left arm and make sure it is in a relaxed position (on a table), and that the cuff is at the approximate level/height of your heart.  Let us  know if you need anything.

## 2024-12-26 ENCOUNTER — Encounter: Payer: Medicare PPO | Admitting: Family Medicine

## 2024-12-30 ENCOUNTER — Ambulatory Visit: Payer: Self-pay

## 2024-12-30 ENCOUNTER — Encounter: Payer: Medicare PPO | Admitting: Family Medicine

## 2024-12-30 ENCOUNTER — Ambulatory Visit: Admitting: Family Medicine

## 2024-12-30 DIAGNOSIS — Z0000 Encounter for general adult medical examination without abnormal findings: Secondary | ICD-10-CM | POA: Insufficient documentation

## 2024-12-30 NOTE — Assessment & Plan Note (Addendum)
Encouraged ECASA daily

## 2024-12-30 NOTE — Assessment & Plan Note (Signed)
 Follows with [facility]. Encouraged DASH or MIND diet, decrease po intake and increase exercise as tolerated. Needs 7-8 hours of sleep nightly. Avoid trans fats, eat small, frequent meals every 4-5 hours with lean proteins, complex carbs and healthy fats. Minimize simple carbs, high fat foods and processed foods

## 2024-12-30 NOTE — Assessment & Plan Note (Signed)
 hgba1c acceptable, minimize simple carbs. Increase exercise as tolerated.

## 2024-12-30 NOTE — Assessment & Plan Note (Signed)
 Well controlled, no changes to meds. Encouraged heart healthy diet such as the DASH diet and exercise as tolerated.

## 2024-12-30 NOTE — Telephone Encounter (Signed)
 FYI Only or Action Required?: Action required by provider: clinical question for provider. Scheduled appointment with Harlene NP 1PM 01/02/2025   Patient was last seen in primary care on 11/27/2024 by Frann Mabel Mt, DO.  Called Nurse Triage reporting Hypertension.  Symptoms began several weeks ago.  Interventions attempted: Prescription medications: amlodipine  .  Symptoms are: stable.  Triage Disposition: See PCP When Office is Open (Within 3 Days)  Patient/caregiver understands and will follow disposition?: Yes       Reason for Disposition  Systolic BP >= 160 OR Diastolic >= 100  Answer Assessment - Initial Assessment Questions Patient with  spouse reports 1/18 930 PM headache and blood pressure 160/75, talked to staff amlodipine   to 5 mg was changed to 5 mg was 2.5 mg , later 1/18 gave the medication again and it went to 145/80 at 10:15 PM. Took blood pressure few times 1/20 varies 130/76 and later 157/91, 154/88. Has not checked blood pressure this morning. No headache this morning. 1 time had the headache took tylenol , back and middle of head and pain was 4/10. Spouse saw a cardiologist for own heart issues Dr Ripley 1/22,patient was at spouses appointment and spouse  asked the cardiologist what about patients  blood pressure, cardiologist said to patient  you have my permission to go up to 7.5 mg per Dr Frederic on 1/22.  Last time saw Dr. [facility] , hasn't seen this provider in 2-3 years. Since that visit with Dr Ripley has been taking 7.5 mg amlodpine. Does not think has any readings since the increased dose amlodipine  has not had headache.  Booked follow up blood pressure 1/29 , patient needs call back for annual to be scheduled    2. ONSET: When did you take your blood pressure?     Last took blood pressure was 1/22 850 AM blood pressure 120/73 3. HOW: How did you take your blood pressure? (e.g., automatic home BP monitor,  visiting nurse)     Automatic cuff  4. HISTORY: Do you have a history of high blood pressure?     Primary hypertension  5. MEDICINES: Are you taking any medicines for blood pressure? Have you missed any doses recently?     Amlodipine  , now taking 7.5 mg   6. OTHER SYMPTOMS: Do you have any symptoms? (e.g., blurred vision, chest pain, difficulty breathing, headache, weakness)     Denies   Patient denies the following chest pain, shortness of breath, dizziness, visual changes, severe headaches  Protocols used: Blood Pressure - High-A-AH  Message from [place] [facility] sent at 12/30/2024  8:41 AM EST  Reason for Triage: The Patient called to reschedule and mentioned she is having issues with her blood pressure being high,  and painful headaches

## 2024-12-30 NOTE — Assessment & Plan Note (Signed)
 Supplement and monitor

## 2024-12-30 NOTE — Assessment & Plan Note (Signed)
 Patient encouraged to maintain heart healthy diet, regular exercise, adequate sleep. Consider daily probiotics. Take medications as prescribed

## 2024-12-30 NOTE — Assessment & Plan Note (Signed)
 Encouraged daily intake of calcium and vitamin D . Advised engaging in regular exercise as tolerated to support overall bone and CV health.  Last DEXA 06/2021, Repeat 2 years. Referral sent to update DEXA.

## 2024-12-30 NOTE — Telephone Encounter (Signed)
 Appt scheduled

## 2024-12-30 NOTE — Progress Notes (Unsigned)
 "  Acute Office Visit  Subjective:     Patient ID: Jenna Johnson, female    DOB: Sep 02, 1956, 69 y.o.   MRN: [id number]  No chief complaint on file.   HPI Patient is in today for CPE  PMHx- HTN, TIA,  Presents after elevated home blood pressure readings and associated headache.  She reports home blood pressure readings have ranged SBP- 130s up to the mid-150s, with DBP 80s-90s While attending spouses cardiology appointment, cardiologist advised patient to increase amlodipine  to 7.5 mg, which patient has been taking since. Most recent BP on 1/22 at 8:50 AM was 120/73. Patient reports no adverse Ses since dose increase. Denies chest pain, shortness of breath, dizziness, visual changes, or neurologic symptoms.   HCM Mgm: Last 09/2024, Repeat 1 year Pap: DEXA: Last 06/2021, due  ROS  See HPI    Objective:    There were no vitals taken for this visit. {Vitals History (Optional):23777}  Physical Exam Vitals reviewed.  Constitutional:      General: She is not in acute distress.    Appearance: She is not toxic-appearing.  HENT:     Head: Normocephalic and atraumatic.     Mouth/Throat:     Mouth: Mucous membranes are moist.     Pharynx: Oropharynx is clear.  Eyes:     Pupils: Pupils are equal, round, and reactive to light.  Cardiovascular:     Rate and Rhythm: Normal rate and regular rhythm.     Pulses: Normal pulses.     Heart sounds: Normal heart sounds. No murmur heard. Pulmonary:     Effort: Pulmonary effort is normal. No respiratory distress.     Breath sounds: Normal breath sounds. No wheezing.  Musculoskeletal:        General: No swelling.     Cervical back: Neck supple.  Skin:    General: Skin is warm and dry.  Neurological:     General: No focal deficit present.     Mental Status: She is alert and oriented to person, place, and time.  Psychiatric:        Mood and Affect: Mood normal.        Behavior: Behavior normal.        Thought Content: Thought  content normal.        Judgment: Judgment normal.     No results found for any visits on 01/02/25.      Assessment & Plan:   Problem List Items Addressed This Visit       Cardiovascular and Mediastinum   Essential hypertension   Well controlled, no changes to meds. Encouraged heart healthy diet such as the DASH diet and exercise as tolerated.        TIA (transient ischemic attack)   Encouraged ECASA daily         Musculoskeletal and Integument   Osteopenia   Encouraged daily intake of calcium and vitamin D . Advised engaging in regular exercise as tolerated to support overall bone and CV health.         Other   Annual physical exam - Primary   Patient encouraged to maintain heart healthy diet, regular exercise, adequate sleep. Consider daily probiotics. Take medications as prescribed.        Class 1 obesity with serious comorbidity and body mass index (BMI) of 32.0 to 32.9 in adult   Follows with [facility]. Encouraged DASH or MIND diet, decrease po intake and increase exercise as tolerated. Needs 7-8 hours  of sleep nightly. Avoid trans fats, eat small, frequent meals every 4-5 hours with lean proteins, complex carbs and healthy fats. Minimize simple carbs, high fat foods and processed foods       Prediabetes   hgba1c acceptable, minimize simple carbs. Increase exercise as tolerated       Vitamin D  deficiency   Supplement and monitor       FU 6 months   No orders of the defined types were placed in this encounter.   No follow-ups on file.  Harlene LITTIE Jolly, NP   "

## 2025-01-02 ENCOUNTER — Encounter: Payer: Self-pay | Admitting: Student

## 2025-01-02 ENCOUNTER — Ambulatory Visit: Admitting: Student

## 2025-01-02 VITALS — BP 130/82 | HR 77 | Temp 97.8°F | Resp 16 | Ht 60.5 in | Wt 174.4 lb

## 2025-01-02 DIAGNOSIS — E559 Vitamin D deficiency, unspecified: Secondary | ICD-10-CM | POA: Diagnosis not present

## 2025-01-02 DIAGNOSIS — M8580 Other specified disorders of bone density and structure, unspecified site: Secondary | ICD-10-CM | POA: Diagnosis not present

## 2025-01-02 DIAGNOSIS — Z6832 Body mass index (BMI) 32.0-32.9, adult: Secondary | ICD-10-CM | POA: Diagnosis not present

## 2025-01-02 DIAGNOSIS — K219 Gastro-esophageal reflux disease without esophagitis: Secondary | ICD-10-CM

## 2025-01-02 DIAGNOSIS — Z0000 Encounter for general adult medical examination without abnormal findings: Secondary | ICD-10-CM

## 2025-01-02 DIAGNOSIS — Z780 Asymptomatic menopausal state: Secondary | ICD-10-CM | POA: Diagnosis not present

## 2025-01-02 DIAGNOSIS — R7303 Prediabetes: Secondary | ICD-10-CM

## 2025-01-02 DIAGNOSIS — E66811 Obesity, class 1: Secondary | ICD-10-CM

## 2025-01-02 DIAGNOSIS — G459 Transient cerebral ischemic attack, unspecified: Secondary | ICD-10-CM

## 2025-01-02 DIAGNOSIS — I10 Essential (primary) hypertension: Secondary | ICD-10-CM | POA: Diagnosis not present

## 2025-01-02 MED ORDER — OMEPRAZOLE 20 MG PO CPDR: 20.0000 mg | DELAYED_RELEASE_CAPSULE | Freq: Every day | ORAL | Status: AC | PRN

## 2025-01-02 MED ORDER — AMLODIPINE BESYLATE 5 MG PO TABS: 7.5000 mg | ORAL_TABLET | Freq: Every day | ORAL | 2 refills | Status: AC

## 2025-01-02 NOTE — Assessment & Plan Note (Signed)
 Patient encouraged to maintain heart healthy diet, regular exercise, adequate sleep. Consider daily probiotics. Take medications as prescribed. Labs ordered and reviewed

## 2025-01-09 ENCOUNTER — Encounter: Admitting: Student

## 2025-01-16 ENCOUNTER — Encounter: Admitting: Student in an Organized Health Care Education/Training Program

## 2025-01-27 ENCOUNTER — Ambulatory Visit (INDEPENDENT_AMBULATORY_CARE_PROVIDER_SITE_OTHER): Admitting: Adult Health
